# Patient Record
Sex: Female | Born: 1970 | Race: White | Hispanic: Yes | Smoking: Never smoker
Health system: Southern US, Community
[De-identification: ages and names within clinical notes are randomized; demographics above are authoritative.]

## PROBLEM LIST (undated history)

## (undated) DIAGNOSIS — R0600 Dyspnea, unspecified: Secondary | ICD-10-CM

## (undated) DIAGNOSIS — R200 Anesthesia of skin: Secondary | ICD-10-CM

## (undated) DIAGNOSIS — C541 Malignant neoplasm of endometrium: Secondary | ICD-10-CM

## (undated) DIAGNOSIS — R5383 Other fatigue: Secondary | ICD-10-CM

## (undated) DIAGNOSIS — Z87442 Personal history of urinary calculi: Secondary | ICD-10-CM

## (undated) DIAGNOSIS — I82C11 Acute embolism and thrombosis of right internal jugular vein: Secondary | ICD-10-CM

## (undated) DIAGNOSIS — G43909 Migraine, unspecified, not intractable, without status migrainosus: Secondary | ICD-10-CM

## (undated) DIAGNOSIS — Z8049 Family history of malignant neoplasm of other genital organs: Secondary | ICD-10-CM

## (undated) DIAGNOSIS — R000 Tachycardia, unspecified: Secondary | ICD-10-CM

## (undated) DIAGNOSIS — H9190 Unspecified hearing loss, unspecified ear: Secondary | ICD-10-CM

## (undated) DIAGNOSIS — E119 Type 2 diabetes mellitus without complications: Secondary | ICD-10-CM

## (undated) DIAGNOSIS — H53002 Unspecified amblyopia, left eye: Secondary | ICD-10-CM

## (undated) HISTORY — DX: Family history of malignant neoplasm of other genital organs: Z80.49

## (undated) HISTORY — DX: Type 2 diabetes mellitus without complications: E11.9

## (undated) HISTORY — DX: Acute embolism and thrombosis of right internal jugular vein: I82.C11

---

## 1997-04-05 ENCOUNTER — Ambulatory Visit (HOSPITAL_COMMUNITY): Admission: RE | Admit: 1997-04-05 | Discharge: 1997-04-05 | Payer: Self-pay | Admitting: Obstetrics and Gynecology

## 1997-04-16 ENCOUNTER — Other Ambulatory Visit: Admission: RE | Admit: 1997-04-16 | Discharge: 1997-04-16 | Payer: Self-pay | Admitting: Obstetrics and Gynecology

## 1997-04-30 ENCOUNTER — Ambulatory Visit (HOSPITAL_COMMUNITY): Admission: RE | Admit: 1997-04-30 | Discharge: 1997-04-30 | Payer: Self-pay | Admitting: Obstetrics and Gynecology

## 1997-05-30 ENCOUNTER — Other Ambulatory Visit: Admission: RE | Admit: 1997-05-30 | Discharge: 1997-05-30 | Payer: Self-pay | Admitting: Obstetrics and Gynecology

## 1997-06-05 ENCOUNTER — Inpatient Hospital Stay (HOSPITAL_COMMUNITY): Admission: AD | Admit: 1997-06-05 | Discharge: 1997-06-05 | Payer: Self-pay | Admitting: *Deleted

## 1997-06-27 ENCOUNTER — Inpatient Hospital Stay (HOSPITAL_COMMUNITY): Admission: AD | Admit: 1997-06-27 | Discharge: 1997-06-27 | Payer: Self-pay | Admitting: Obstetrics

## 1997-06-30 ENCOUNTER — Inpatient Hospital Stay (HOSPITAL_COMMUNITY): Admission: AD | Admit: 1997-06-30 | Discharge: 1997-06-30 | Payer: Self-pay | Admitting: *Deleted

## 1997-07-02 ENCOUNTER — Inpatient Hospital Stay (HOSPITAL_COMMUNITY): Admission: AD | Admit: 1997-07-02 | Discharge: 1997-07-02 | Payer: Self-pay | Admitting: Obstetrics

## 1997-07-04 ENCOUNTER — Inpatient Hospital Stay (HOSPITAL_COMMUNITY): Admission: RE | Admit: 1997-07-04 | Discharge: 1997-07-04 | Payer: Self-pay | Admitting: Obstetrics

## 1997-07-09 ENCOUNTER — Inpatient Hospital Stay (HOSPITAL_COMMUNITY): Admission: AD | Admit: 1997-07-09 | Discharge: 1997-07-11 | Payer: Self-pay | Admitting: Obstetrics

## 2003-09-06 ENCOUNTER — Emergency Department (HOSPITAL_COMMUNITY): Admission: EM | Admit: 2003-09-06 | Discharge: 2003-09-06 | Payer: Self-pay | Admitting: Emergency Medicine

## 2008-09-25 ENCOUNTER — Inpatient Hospital Stay (HOSPITAL_COMMUNITY): Admission: EM | Admit: 2008-09-25 | Discharge: 2008-09-28 | Payer: Self-pay | Admitting: Emergency Medicine

## 2008-10-02 ENCOUNTER — Inpatient Hospital Stay (HOSPITAL_COMMUNITY): Admission: EM | Admit: 2008-10-02 | Discharge: 2008-10-03 | Payer: Self-pay | Admitting: Emergency Medicine

## 2008-10-02 ENCOUNTER — Encounter (INDEPENDENT_AMBULATORY_CARE_PROVIDER_SITE_OTHER): Payer: Self-pay | Admitting: Surgery

## 2009-01-05 HISTORY — PX: CHOLECYSTECTOMY: SHX55

## 2009-06-10 ENCOUNTER — Emergency Department (HOSPITAL_COMMUNITY): Admission: EM | Admit: 2009-06-10 | Discharge: 2009-06-10 | Payer: Self-pay | Admitting: Emergency Medicine

## 2010-03-24 LAB — URINALYSIS, ROUTINE W REFLEX MICROSCOPIC
Bilirubin Urine: NEGATIVE
Glucose, UA: NEGATIVE mg/dL
Specific Gravity, Urine: 1.017 (ref 1.005–1.030)
Urobilinogen, UA: 1 mg/dL (ref 0.0–1.0)
pH: 7.5 (ref 5.0–8.0)

## 2010-03-24 LAB — COMPREHENSIVE METABOLIC PANEL
Albumin: 3.7 g/dL (ref 3.5–5.2)
Calcium: 8.7 mg/dL (ref 8.4–10.5)
Glucose, Bld: 96 mg/dL (ref 70–99)
Sodium: 136 mEq/L (ref 135–145)

## 2010-03-24 LAB — CBC
HCT: 39.4 % (ref 36.0–46.0)
Hemoglobin: 13.7 g/dL (ref 12.0–15.0)
MCV: 86.2 fL (ref 78.0–100.0)
Platelets: 346 10*3/uL (ref 150–400)
RBC: 4.57 MIL/uL (ref 3.87–5.11)
WBC: 9.8 10*3/uL (ref 4.0–10.5)

## 2010-03-24 LAB — GC/CHLAMYDIA PROBE AMP, GENITAL
Chlamydia, DNA Probe: NEGATIVE
GC Probe Amp, Genital: NEGATIVE

## 2010-03-24 LAB — DIFFERENTIAL
Basophils Absolute: 0 10*3/uL (ref 0.0–0.1)
Eosinophils Absolute: 0.3 10*3/uL (ref 0.0–0.7)
Eosinophils Relative: 3 % (ref 0–5)
Monocytes Relative: 5 % (ref 3–12)

## 2010-04-11 LAB — URINE CULTURE

## 2010-04-11 LAB — DIFFERENTIAL
Basophils Absolute: 0 10*3/uL (ref 0.0–0.1)
Basophils Absolute: 0.1 10*3/uL (ref 0.0–0.1)
Basophils Relative: 0 % (ref 0–1)
Eosinophils Absolute: 0.6 10*3/uL (ref 0.0–0.7)
Eosinophils Relative: 4 % (ref 0–5)
Eosinophils Relative: 5 % (ref 0–5)
Lymphocytes Relative: 29 % (ref 12–46)
Lymphs Abs: 3.3 10*3/uL (ref 0.7–4.0)
Lymphs Abs: 3.9 10*3/uL (ref 0.7–4.0)
Monocytes Absolute: 0.8 10*3/uL (ref 0.1–1.0)
Monocytes Relative: 5 % (ref 3–12)
Neutro Abs: 6.7 10*3/uL (ref 1.7–7.7)
Neutro Abs: 8.4 10*3/uL — ABNORMAL HIGH (ref 1.7–7.7)

## 2010-04-11 LAB — URINALYSIS, ROUTINE W REFLEX MICROSCOPIC
Bilirubin Urine: NEGATIVE
Bilirubin Urine: NEGATIVE
Glucose, UA: NEGATIVE mg/dL
Hgb urine dipstick: NEGATIVE
Ketones, ur: 15 mg/dL — AB
Ketones, ur: NEGATIVE mg/dL
Nitrite: NEGATIVE
Nitrite: NEGATIVE
Protein, ur: NEGATIVE mg/dL
Specific Gravity, Urine: 1.028 (ref 1.005–1.030)
Specific Gravity, Urine: 1.038 — ABNORMAL HIGH (ref 1.005–1.030)
Urobilinogen, UA: 0.2 mg/dL (ref 0.0–1.0)
Urobilinogen, UA: 0.2 mg/dL (ref 0.0–1.0)
pH: 5.5 (ref 5.0–8.0)
pH: 6 (ref 5.0–8.0)
pH: 6 (ref 5.0–8.0)

## 2010-04-11 LAB — POCT I-STAT, CHEM 8
Chloride: 102 mEq/L (ref 96–112)
HCT: 47 % — ABNORMAL HIGH (ref 36.0–46.0)
Hemoglobin: 16 g/dL — ABNORMAL HIGH (ref 12.0–15.0)
Potassium: 3.4 mEq/L — ABNORMAL LOW (ref 3.5–5.1)
Sodium: 138 mEq/L (ref 135–145)

## 2010-04-11 LAB — URINE MICROSCOPIC-ADD ON

## 2010-04-11 LAB — CBC
HCT: 41.1 % (ref 36.0–46.0)
Hemoglobin: 12.5 g/dL (ref 12.0–15.0)
Hemoglobin: 14.1 g/dL (ref 12.0–15.0)
MCHC: 34.2 g/dL (ref 30.0–36.0)
MCV: 86.6 fL (ref 78.0–100.0)
Platelets: 347 10*3/uL (ref 150–400)
Platelets: 409 10*3/uL — ABNORMAL HIGH (ref 150–400)

## 2010-04-11 LAB — BASIC METABOLIC PANEL
CO2: 28 mEq/L (ref 19–32)
Calcium: 8.9 mg/dL (ref 8.4–10.5)
Creatinine, Ser: 0.61 mg/dL (ref 0.4–1.2)
GFR calc Af Amer: >60 mL/min (ref >60)
GFR calc non Af Amer: >60 mL/min (ref >60)
Glucose, Bld: 92 mg/dL (ref 70–99)
Potassium: 4.3 mEq/L (ref 3.5–5.1)
Sodium: 138 mEq/L (ref 135–145)

## 2010-04-11 LAB — COMPREHENSIVE METABOLIC PANEL
ALT: 22 U/L (ref 0–35)
BUN: 13 mg/dL (ref 6–23)
Calcium: 9.3 mg/dL (ref 8.4–10.5)
Chloride: 102 mEq/L (ref 96–112)
GFR calc Af Amer: >60 mL/min (ref >60)
Potassium: 3.5 mEq/L (ref 3.5–5.1)

## 2010-04-11 LAB — HEPATIC FUNCTION PANEL
ALT: 29 U/L (ref 0–35)
AST: 19 U/L (ref 0–37)
Alkaline Phosphatase: 73 U/L (ref 39–117)
Bilirubin, Direct: 0.1 mg/dL (ref 0.0–0.3)

## 2010-04-11 LAB — LIPASE, BLOOD: Lipase: 28 U/L (ref 11–59)

## 2010-04-11 LAB — POCT CARDIAC MARKERS
CKMB, poc: <1 ng/mL — ABNORMAL LOW (ref 1.0–8.0)
Myoglobin, poc: 46.5 ng/mL (ref 12–200)

## 2010-04-11 LAB — POCT PREGNANCY, URINE: Preg Test, Ur: NEGATIVE

## 2010-09-29 ENCOUNTER — Emergency Department (HOSPITAL_COMMUNITY)
Admission: EM | Admit: 2010-09-29 | Discharge: 2010-09-29 | Disposition: A | Discharge status: Home / Self Care | Payer: Self-pay | Attending: Emergency Medicine | Admitting: Emergency Medicine

## 2010-09-29 DIAGNOSIS — Z87442 Personal history of urinary calculi: Secondary | ICD-10-CM | POA: Insufficient documentation

## 2010-09-29 DIAGNOSIS — K2920 Alcoholic gastritis without bleeding: Secondary | ICD-10-CM | POA: Insufficient documentation

## 2010-09-29 DIAGNOSIS — R109 Unspecified abdominal pain: Secondary | ICD-10-CM | POA: Insufficient documentation

## 2010-09-29 LAB — COMPREHENSIVE METABOLIC PANEL
ALT: 57 U/L — ABNORMAL HIGH (ref 0–35)
Alkaline Phosphatase: 76 U/L (ref 39–117)
CO2: 23 mEq/L (ref 19–32)
Chloride: 103 mEq/L (ref 96–112)
GFR calc Af Amer: >60 mL/min (ref >60)
Glucose, Bld: 112 mg/dL — ABNORMAL HIGH (ref 70–99)
Potassium: 3.2 mEq/L — ABNORMAL LOW (ref 3.5–5.1)
Sodium: 140 mEq/L (ref 135–145)
Total Bilirubin: 0.2 mg/dL — ABNORMAL LOW (ref 0.3–1.2)
Total Protein: 7.9 g/dL (ref 6.0–8.3)

## 2010-09-29 LAB — CBC
Hemoglobin: 13.3 g/dL (ref 12.0–15.0)
MCHC: 33.3 g/dL (ref 30.0–36.0)
RBC: 4.75 MIL/uL (ref 3.87–5.11)
WBC: 6.7 10*3/uL (ref 4.0–10.5)

## 2010-09-29 LAB — DIFFERENTIAL
Basophils Absolute: 0 10*3/uL (ref 0.0–0.1)
Basophils Relative: 0 % (ref 0–1)
Neutro Abs: 3.9 10*3/uL (ref 1.7–7.7)
Neutrophils Relative %: 58 % (ref 43–77)

## 2011-07-30 DIAGNOSIS — H11249 Scarring of conjunctiva, unspecified eye: Secondary | ICD-10-CM | POA: Insufficient documentation

## 2011-07-30 DIAGNOSIS — H02412 Mechanical ptosis of left eyelid: Secondary | ICD-10-CM | POA: Insufficient documentation

## 2011-07-30 DIAGNOSIS — H179 Unspecified corneal scar and opacity: Secondary | ICD-10-CM | POA: Insufficient documentation

## 2011-12-13 ENCOUNTER — Encounter (HOSPITAL_COMMUNITY): Payer: Self-pay | Admitting: *Deleted

## 2011-12-13 ENCOUNTER — Emergency Department (HOSPITAL_COMMUNITY): Payer: No Typology Code available for payment source

## 2011-12-13 ENCOUNTER — Emergency Department (HOSPITAL_COMMUNITY)
Admission: EM | Admit: 2011-12-13 | Discharge: 2011-12-13 | Disposition: A | Discharge status: Home / Self Care | Payer: No Typology Code available for payment source | Attending: Emergency Medicine | Admitting: Emergency Medicine

## 2011-12-13 ENCOUNTER — Other Ambulatory Visit: Payer: Self-pay

## 2011-12-13 DIAGNOSIS — M79609 Pain in unspecified limb: Secondary | ICD-10-CM | POA: Insufficient documentation

## 2011-12-13 DIAGNOSIS — T148XXA Other injury of unspecified body region, initial encounter: Secondary | ICD-10-CM

## 2011-12-13 DIAGNOSIS — T07XXXA Unspecified multiple injuries, initial encounter: Secondary | ICD-10-CM | POA: Insufficient documentation

## 2011-12-13 DIAGNOSIS — M2550 Pain in unspecified joint: Secondary | ICD-10-CM | POA: Insufficient documentation

## 2011-12-13 DIAGNOSIS — S129XXA Fracture of neck, unspecified, initial encounter: Secondary | ICD-10-CM | POA: Insufficient documentation

## 2011-12-13 DIAGNOSIS — Y9389 Activity, other specified: Secondary | ICD-10-CM | POA: Insufficient documentation

## 2011-12-13 DIAGNOSIS — IMO0001 Reserved for inherently not codable concepts without codable children: Secondary | ICD-10-CM | POA: Insufficient documentation

## 2011-12-13 DIAGNOSIS — R51 Headache: Secondary | ICD-10-CM | POA: Insufficient documentation

## 2011-12-13 DIAGNOSIS — Y92410 Unspecified street and highway as the place of occurrence of the external cause: Secondary | ICD-10-CM | POA: Insufficient documentation

## 2011-12-13 DIAGNOSIS — M542 Cervicalgia: Secondary | ICD-10-CM | POA: Insufficient documentation

## 2011-12-13 LAB — CBC WITH DIFFERENTIAL/PLATELET
Basophils Relative: 0 % (ref 0–1)
Eosinophils Absolute: 0.1 10*3/uL (ref 0.0–0.7)
HCT: 42.8 % (ref 36.0–46.0)
Hemoglobin: 14.7 g/dL (ref 12.0–15.0)
MCH: 29.5 pg (ref 26.0–34.0)
MCHC: 34.3 g/dL (ref 30.0–36.0)
Monocytes Absolute: 0.5 10*3/uL (ref 0.1–1.0)
Monocytes Relative: 4 % (ref 3–12)
Neutrophils Relative %: 84 % — ABNORMAL HIGH (ref 43–77)

## 2011-12-13 LAB — BASIC METABOLIC PANEL
BUN: 8 mg/dL (ref 6–23)
GFR calc non Af Amer: >90 mL/min (ref >90)
Glucose, Bld: 113 mg/dL — ABNORMAL HIGH (ref 70–99)
Potassium: 4.2 mEq/L (ref 3.5–5.1)

## 2011-12-13 MED ORDER — IBUPROFEN 600 MG PO TABS: 600.0000 mg | ORAL_TABLET | Freq: Four times a day (QID) | ORAL | Status: DC | PRN

## 2011-12-13 MED ORDER — HYDROMORPHONE HCL PF 1 MG/ML IJ SOLN
0.5000 mg | Freq: Once | INTRAMUSCULAR | Status: AC
  Administered 2011-12-13: 0.5 mg via INTRAVENOUS
  Filled 2011-12-13: qty 1

## 2011-12-13 MED ORDER — HYDROCODONE-ACETAMINOPHEN 5-325 MG PO TABS: 1.0000 | ORAL_TABLET | ORAL | Status: DC | PRN

## 2011-12-13 MED ORDER — METHOCARBAMOL 500 MG PO TABS: 500.0000 mg | ORAL_TABLET | Freq: Two times a day (BID) | ORAL | Status: DC

## 2011-12-13 MED ORDER — MORPHINE SULFATE 4 MG/ML IJ SOLN
4.0000 mg | Freq: Once | INTRAMUSCULAR | Status: AC
  Administered 2011-12-13: 4 mg via INTRAVENOUS
  Filled 2011-12-13: qty 1

## 2011-12-13 MED ORDER — HYDROCODONE-ACETAMINOPHEN 5-325 MG PO TABS: 2.0000 | ORAL_TABLET | Freq: Once | ORAL | Status: DC

## 2011-12-13 MED ORDER — SODIUM CHLORIDE 0.9 % IV BOLUS (SEPSIS)
1000.0000 mL | Freq: Once | INTRAVENOUS | Status: AC
  Administered 2011-12-13: 1000 mL via INTRAVENOUS

## 2011-12-13 MED ORDER — IOHEXOL 300 MG/ML  SOLN
100.0000 mL | Freq: Once | INTRAMUSCULAR | Status: AC | PRN
  Administered 2011-12-13: 100 mL via INTRAVENOUS

## 2011-12-13 MED ORDER — HYDROCODONE-ACETAMINOPHEN 5-325 MG PO TABS
1.0000 | ORAL_TABLET | Freq: Once | ORAL | Status: AC
  Administered 2011-12-13: 1 via ORAL
  Filled 2011-12-13: qty 1

## 2011-12-13 NOTE — ED Notes (Signed)
Pt has redness noted to L eye with what appears to be discoloration to the top of her retina.  Pt states that this is normal for her and she is being evaluated for this by her MD.
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
## 2011-12-13 NOTE — ED Notes (Signed)
Phlebotomy at bedside to collect blood samples

## 2015-12-09 ENCOUNTER — Emergency Department (HOSPITAL_COMMUNITY)
Admission: EM | Admit: 2015-12-09 | Discharge: 2015-12-09 | Disposition: A | Discharge status: Home / Self Care | Payer: Self-pay | Attending: Emergency Medicine | Admitting: Emergency Medicine

## 2015-12-09 ENCOUNTER — Emergency Department (HOSPITAL_COMMUNITY): Payer: Self-pay

## 2015-12-09 ENCOUNTER — Encounter (HOSPITAL_COMMUNITY): Payer: Self-pay | Admitting: Emergency Medicine

## 2015-12-09 DIAGNOSIS — S0990XA Unspecified injury of head, initial encounter: Secondary | ICD-10-CM | POA: Insufficient documentation

## 2015-12-09 DIAGNOSIS — Y92009 Unspecified place in unspecified non-institutional (private) residence as the place of occurrence of the external cause: Secondary | ICD-10-CM | POA: Insufficient documentation

## 2015-12-09 DIAGNOSIS — H05222 Edema of left orbit: Secondary | ICD-10-CM | POA: Insufficient documentation

## 2015-12-09 DIAGNOSIS — Z7982 Long term (current) use of aspirin: Secondary | ICD-10-CM | POA: Insufficient documentation

## 2015-12-09 DIAGNOSIS — S60221A Contusion of right hand, initial encounter: Secondary | ICD-10-CM | POA: Insufficient documentation

## 2015-12-09 DIAGNOSIS — Y999 Unspecified external cause status: Secondary | ICD-10-CM | POA: Insufficient documentation

## 2015-12-09 DIAGNOSIS — Y939 Activity, unspecified: Secondary | ICD-10-CM | POA: Insufficient documentation

## 2015-12-09 MED ORDER — HYDROCODONE-ACETAMINOPHEN 5-325 MG PO TABS
1.0000 | ORAL_TABLET | Freq: Four times a day (QID) | ORAL | 0 refills | Status: DC | PRN
Start: 2015-12-09 — End: 2022-01-14

## 2015-12-09 MED ORDER — HYDROCODONE-ACETAMINOPHEN 5-325 MG PO TABS
2.0000 | ORAL_TABLET | Freq: Once | ORAL | Status: AC
  Administered 2015-12-09: 2 via ORAL
  Filled 2015-12-09: qty 2
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
## 2015-12-09 NOTE — ED Notes (Signed)
Pt stable, ambulatory, states understanding of discharge instructions 
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
## 2022-01-12 ENCOUNTER — Other Ambulatory Visit: Payer: Self-pay

## 2022-01-12 ENCOUNTER — Emergency Department (HOSPITAL_COMMUNITY)
Admission: EM | Admit: 2022-01-12 | Discharge: 2022-01-13 | Disposition: A | Discharge status: Home / Self Care | Payer: Self-pay | Attending: Emergency Medicine | Admitting: Emergency Medicine

## 2022-01-12 DIAGNOSIS — R109 Unspecified abdominal pain: Secondary | ICD-10-CM

## 2022-01-12 DIAGNOSIS — N9489 Other specified conditions associated with female genital organs and menstrual cycle: Secondary | ICD-10-CM | POA: Insufficient documentation

## 2022-01-12 DIAGNOSIS — C539 Malignant neoplasm of cervix uteri, unspecified: Secondary | ICD-10-CM | POA: Insufficient documentation

## 2022-01-12 DIAGNOSIS — N888 Other specified noninflammatory disorders of cervix uteri: Secondary | ICD-10-CM

## 2022-01-12 DIAGNOSIS — N938 Other specified abnormal uterine and vaginal bleeding: Secondary | ICD-10-CM | POA: Insufficient documentation

## 2022-01-12 DIAGNOSIS — A599 Trichomoniasis, unspecified: Secondary | ICD-10-CM | POA: Insufficient documentation

## 2022-01-12 DIAGNOSIS — Z7982 Long term (current) use of aspirin: Secondary | ICD-10-CM | POA: Insufficient documentation

## 2022-01-12 LAB — CBC WITH DIFFERENTIAL/PLATELET
Abs Immature Granulocytes: 0.05 10*3/uL (ref 0.00–0.07)
Basophils Absolute: 0.1 10*3/uL (ref 0.0–0.1)
Basophils Relative: 1 %
Eosinophils Absolute: 0.1 10*3/uL (ref 0.0–0.5)
Eosinophils Relative: 1 %
HCT: 29.8 % — ABNORMAL LOW (ref 36.0–46.0)
Hemoglobin: 8.4 g/dL — ABNORMAL LOW (ref 12.0–15.0)
Immature Granulocytes: 0 %
Lymphocytes Relative: 26 %
Lymphs Abs: 3.4 10*3/uL (ref 0.7–4.0)
MCH: 18.5 pg — ABNORMAL LOW (ref 26.0–34.0)
MCHC: 28.2 g/dL — ABNORMAL LOW (ref 30.0–36.0)
MCV: 65.6 fL — ABNORMAL LOW (ref 80.0–100.0)
Monocytes Absolute: 0.7 10*3/uL (ref 0.1–1.0)
Monocytes Relative: 6 %
Neutro Abs: 9 10*3/uL — ABNORMAL HIGH (ref 1.7–7.7)
Neutrophils Relative %: 66 %
Platelets: 796 10*3/uL — ABNORMAL HIGH (ref 150–400)
RBC: 4.54 MIL/uL (ref 3.87–5.11)
RDW: 17.2 % — ABNORMAL HIGH (ref 11.5–15.5)
WBC: 13.4 10*3/uL — ABNORMAL HIGH (ref 4.0–10.5)
nRBC: 0.1 % (ref 0.0–0.2)

## 2022-01-12 LAB — COMPREHENSIVE METABOLIC PANEL
ALT: 41 U/L (ref 0–44)
AST: 42 U/L — ABNORMAL HIGH (ref 15–41)
Albumin: 3.3 g/dL — ABNORMAL LOW (ref 3.5–5.0)
Alkaline Phosphatase: 85 U/L (ref 38–126)
Anion gap: 9 (ref 5–15)
BUN: 5 mg/dL — ABNORMAL LOW (ref 6–20)
CO2: 25 mmol/L (ref 22–32)
Calcium: 8.8 mg/dL — ABNORMAL LOW (ref 8.9–10.3)
Chloride: 98 mmol/L (ref 98–111)
Creatinine, Ser: 0.56 mg/dL (ref 0.44–1.00)
GFR, Estimated: >60 mL/min (ref >60)
Glucose, Bld: 171 mg/dL — ABNORMAL HIGH (ref 70–99)
Potassium: 3.9 mmol/L (ref 3.5–5.1)
Sodium: 132 mmol/L — ABNORMAL LOW (ref 135–145)
Total Bilirubin: 0.5 mg/dL (ref 0.3–1.2)
Total Protein: 8 g/dL (ref 6.5–8.1)

## 2022-01-12 LAB — I-STAT BETA HCG BLOOD, ED (MC, WL, AP ONLY): I-stat hCG, quantitative: <5 m[IU]/mL (ref <5)

## 2022-01-12 LAB — LIPASE, BLOOD: Lipase: 39 U/L (ref 11–51)

## 2022-01-12 MED ORDER — ONDANSETRON 4 MG PO TBDP
4.0000 mg | ORAL_TABLET | Freq: Once | ORAL | Status: AC
  Administered 2022-01-12: 4 mg via ORAL
  Filled 2022-01-12: qty 1

## 2022-01-12 MED ORDER — OXYCODONE-ACETAMINOPHEN 5-325 MG PO TABS
2.0000 | ORAL_TABLET | Freq: Once | ORAL | Status: AC
  Administered 2022-01-12: 2 via ORAL
  Filled 2022-01-12: qty 2

## 2022-01-12 NOTE — ED Triage Notes (Signed)
Patient reports intermittent generalized abdominal pain with emesis for several days , no fever or diarrhea .

## 2022-01-12 NOTE — ED Provider Triage Note (Signed)
  Emergency Medicine Provider Triage Evaluation Note  MRN:  [id number]  Arrival date & time: 01/13/22    Medically screening exam initiated at 2:40 AM.   CC:   Abdominal Pain   HPI:  Rowan Pollman is a 52 y.o. year-old female presents to the ED with chief complaint of lower abdominal pain for the past 8-9 months.  She states she has the pain everyday, but it worsened today.  Has had nausea and vomiting.  Reports associated subjective fever at home.   Denies dysuria.  Last BM 4 hours ago, which was normal.  History provided by patient. ROS:  -As included in HPI PE:   Vitals:   01/13/22 0156  BP: (!) 147/85  Pulse: 84  Resp: 16  SpO2: 97%    Non-toxic appearing No respiratory distress Generalized lower abdominal discomfort MDM:  Abdominal pain, unspecified. I've ordered labs and imaging in triage to expedite lab/diagnostic workup.  Patient was informed that the remainder of the evaluation will be completed by another provider, this initial triage assessment does not replace that evaluation, and the importance of remaining in the ED until their evaluation is complete.    Montine Circle, PA-C 01/13/22 [phone]

## 2022-01-13 ENCOUNTER — Encounter: Payer: Self-pay | Admitting: Psychiatry

## 2022-01-13 ENCOUNTER — Emergency Department (HOSPITAL_COMMUNITY): Payer: Self-pay

## 2022-01-13 LAB — WET PREP, GENITAL
Clue Cells Wet Prep HPF POC: NONE SEEN
Sperm: NONE SEEN
WBC, Wet Prep HPF POC: >=10 — AB (ref <10)
Yeast Wet Prep HPF POC: NONE SEEN

## 2022-01-13 MED ORDER — IOHEXOL 350 MG/ML SOLN
75.0000 mL | Freq: Once | INTRAVENOUS | Status: AC | PRN
  Administered 2022-01-13: 75 mL via INTRAVENOUS

## 2022-01-13 MED ORDER — DOXYCYCLINE HYCLATE 100 MG PO TABS
100.0000 mg | ORAL_TABLET | Freq: Once | ORAL | Status: AC
  Administered 2022-01-13: 100 mg via ORAL
  Filled 2022-01-13: qty 1

## 2022-01-13 MED ORDER — STERILE WATER FOR INJECTION IJ SOLN
INTRAMUSCULAR | Status: AC
  Filled 2022-01-13: qty 10

## 2022-01-13 MED ORDER — METRONIDAZOLE 500 MG PO TABS
2000.0000 mg | ORAL_TABLET | Freq: Once | ORAL | Status: AC
  Administered 2022-01-13: 2000 mg via ORAL
  Filled 2022-01-13: qty 4

## 2022-01-13 MED ORDER — CEFTRIAXONE SODIUM 500 MG IJ SOLR
500.0000 mg | Freq: Once | INTRAMUSCULAR | Status: AC
  Administered 2022-01-13: 500 mg via INTRAMUSCULAR
  Filled 2022-01-13: qty 500

## 2022-01-13 MED ORDER — DOXYCYCLINE HYCLATE 100 MG PO CAPS: 100.0000 mg | ORAL_CAPSULE | Freq: Two times a day (BID) | ORAL | 0 refills | Status: DC

## 2022-01-13 NOTE — ED Provider Notes (Signed)
[facility] EMERGENCY DEPARTMENT Provider Note   CSN: [id number] Arrival date & time: 01/12/22  2104     History  Chief Complaint  Patient presents with   Abdominal Pain    Maureen Campos is a 52 y.o. female.  Patient here with abdominal pain for last 8 to 9 months.  She has been having some intermittent vaginal bleeding during this time which she thinks is menopause.  She is not on any birth control.  Does not have an OB/GYN or other primary care doctors.  Has not seek medical care in over 10 years.  She has been having some hard stools.  Denies any nausea vomiting, diarrhea, chest pain, shortness of breath.  The history is provided by the patient.       Home Medications Prior to Admission medications   Medication Sig Start Date End Date Taking? Authorizing Provider  aspirin 325 MG tablet Take 325 mg by mouth every 4 (four) hours as needed for mild pain.    [provider]  HYDROcodone-acetaminophen (NORCO/VICODIN) 5-325 MG tablet Take 1-2 tablets by mouth every 6 (six) hours as needed. 12/09/15   Montine Circle, PA-C  ibuprofen (ADVIL,MOTRIN) 600 MG tablet Take 1 tablet (600 mg total) by mouth every 6 (six) hours as needed for pain. Patient not taking: Reported on 12/09/2015 12/13/11   Varney Biles, MD  methocarbamol (ROBAXIN) 500 MG tablet Take 1 tablet (500 mg total) by mouth 2 (two) times daily. Patient not taking: Reported on 12/09/2015 12/13/11   Varney Biles, MD      Allergies    Patient has no known allergies.    Review of Systems   Review of Systems  Physical Exam Updated Vital Signs BP (!) 149/78   Pulse 86   Temp 99 F (37.2 C) (Oral)   Resp 19   SpO2 97%  Physical Exam Vitals and nursing note reviewed. Exam conducted with a chaperone present.  Constitutional:      General: She is not in acute distress.    Appearance: She is well-developed.  HENT:     Head: Normocephalic and atraumatic.  Eyes:     Extraocular  Movements: Extraocular movements intact.     Conjunctiva/sclera: Conjunctivae normal.  Cardiovascular:     Rate and Rhythm: Normal rate and regular rhythm.     Heart sounds: Normal heart sounds. No murmur heard. Pulmonary:     Effort: Pulmonary effort is normal. No respiratory distress.     Breath sounds: Normal breath sounds.  Abdominal:     General: Bowel sounds are normal.     Palpations: Abdomen is soft.     Tenderness: There is generalized abdominal tenderness.  Genitourinary:    Comments: No obvious cervical masses seen but not able to visualize the lower portion of the cervix, there is no vaginal bleeding on my exam Musculoskeletal:        General: No swelling.     Cervical back: Neck supple.  Skin:    General: Skin is warm and dry.     Capillary Refill: Capillary refill takes less than 2 seconds.  Neurological:     Mental Status: She is alert.  Psychiatric:        Mood and Affect: Mood normal.     ED Results / Procedures / Treatments   Labs (all labs ordered are listed, but only abnormal results are displayed) Labs Reviewed  COMPREHENSIVE METABOLIC PANEL - Abnormal; Notable for the following components:  Result Value   Sodium 132 (*)    Glucose, Bld 171 (*)    BUN 5 (*)    Calcium 8.8 (*)    Albumin 3.3 (*)    AST 42 (*)    All other components within normal limits  CBC WITH DIFFERENTIAL/PLATELET - Abnormal; Notable for the following components:   WBC 13.4 (*)    Hemoglobin 8.4 (*)    HCT 29.8 (*)    MCV 65.6 (*)    MCH 18.5 (*)    MCHC 28.2 (*)    RDW 17.2 (*)    Platelets 796 (*)    Neutro Abs 9.0 (*)    All other components within normal limits  WET PREP, GENITAL  LIPASE, BLOOD  URINALYSIS, ROUTINE W REFLEX MICROSCOPIC  I-STAT BETA HCG BLOOD, ED ([facility], [facility], [facility] ONLY)  GC/CHLAMYDIA PROBE AMP ([facility]) NOT AT [facility]    EKG None  Radiology CT ABDOMEN PELVIS W CONTRAST  Result Date: 01/13/2022 CLINICAL DATA:  Acute nonlocalized abdominal pain.  History of colon surgery EXAM: CT ABDOMEN AND PELVIS WITH CONTRAST TECHNIQUE: Multidetector CT imaging of the abdomen and pelvis was performed using the standard protocol following bolus administration of intravenous contrast. RADIATION DOSE REDUCTION: This exam was performed according to the departmental dose-optimization program which includes automated exposure control, adjustment of the mA and/or kV according to patient size and/or use of iterative reconstruction technique. CONTRAST:  20m OMNIPAQUE IOHEXOL 350 MG/ML SOLN COMPARISON:  12/13/2011 FINDINGS: Lower chest: Bibasilar scarring no acute abnormality. Hepatobiliary: Cholecystectomy.  No focal hepatic lesion. Pancreas: Unremarkable. Spleen: Low-density lesion in the posterosuperior spleen is too small to definitively characterize but likely represents a benign cyst. No follow-up is recommended. Adrenals/Urinary Tract: Normal adrenal glands. Low-attenuation lesions in the kidneys are statistically likely to represent cysts. No follow-up is required. No urinary calculi or hydronephrosis. Unremarkable bladder. Stomach/Bowel: Colonic diverticulosis without diverticulitis. Normal caliber large bowel. There is borderline dilation the fecalized distal ileum in the left hemiabdomen measuring 3.1 cm in maximum diameter with smooth transition to the ileocolonic junction. Normal appendix. Vascular/Lymphatic: Right para-aortic node measuring 1.5 cm (series 3/image 45). Right external iliac node measuring 1.5 cm (3/70). These have increased in size since 2013. Reproductive: Lobulated heterogenous cystic lesion centered within the cervix (circa sagittal series image 83). Endometrial thickening measuring approximately 14 mm. No adnexal mass. Other: No free intraperitoneal fluid or air. Musculoskeletal: No acute or significant osseous findings. IMPRESSION: Heterogenous cystic mass centered in the cervix concerning for neoplasm. Endometrial thickening possibly due to  obstruction from the cystic mass. Direct visualization is recommended. Enlarged right para-aortic and right external iliac nodes are indeterminate but concerning for metastasis given cervical lesion. Borderline enlargement and fecalization of the distal ileum. No definite transition point. Findings suggestive of slow transit/constipation. Electronically Signed   By: TPlacido SouM.D.   On: 01/13/2022 02:03    Procedures Procedures    Medications Ordered in ED Medications  oxyCODONE-acetaminophen (PERCOCET/ROXICET) 5-325 MG per tablet 2 tablet (2 tablets Oral Given 01/12/22 2223)  ondansetron (ZOFRAN-ODT) disintegrating tablet 4 mg (4 mg Oral Given 01/12/22 2223)  iohexol (OMNIPAQUE) 350 MG/ML injection 75 mL (75 mLs Intravenous Contrast Given 01/13/22 0139)    ED Course/ Medical Decision Making/ A&P                           Medical Decision Making Amount and/or Complexity of Data Reviewed Labs: ordered.   Maureen Pugmireis  here with abdominal pain.  Normal vitals.  No fever.  No significant comorbidities.  Differential diagnosis includes bowel obstruction versus constipation versus appendicitis.  CBC, CMP, lipase, CT scan abdomen pelvis has been performed prior to my evaluation.  Per my review and interpretation of labs there is no significant anemia or electrolyte abnormality.  Hemoglobin is 8.4 but there is no recent hemoglobin in our system.  Lab work is otherwise unremarkable.  CT scan shows may be some constipation.  But of most concern is a cystic mass centered in the cervix concerning for neoplasm.  Some endometrial thickening as well.  There is also some enlarged lymph nodes nearby that are concerning for cervical lesion with metastasis.  I talked with Dr. Ernestina Patches with gynecology oncology and patient will follow-up outpatient.  Her GU exam for the most part was unremarkable for me.  She not having any active bleeding.  She has been passing some intermittent blood clots every few days  for the last few months.  Overall her hemoglobin stable, her vitals are stable.  She understands return precautions to the ED but will follow-up as am concerned this is likely a cervical cancer process.  Patient was made aware of this.  She is given Dr. Romona Curls number and Dr. Ernestina Patches is aware as well.  Patient discharged in good condition.  This chart was dictated using voice recognition software.  Despite best efforts to proofread,  errors can occur which can change the documentation meaning.         Final Clinical Impression(s) / ED Diagnoses Final diagnoses:  Cervical mass  Abdominal pain, unspecified abdominal location    Rx / DC Orders ED Discharge Orders     None         Lennice Sites, DO 01/13/22 [phone]

## 2022-01-13 NOTE — ED Notes (Signed)
Pt denies soaking more than one pad an hour

## 2022-01-13 NOTE — Discharge Instructions (Addendum)
As discussed there is likely a cervical mass that needs to be evaluated by gynecology team.  Please follow-up with Dr. Ernestina Patches as discussed.  Our concern is that this could be a cancerous process.  Also recommend using MiraLAX for any constipation symptoms as well.  Please return if you have any worsening vaginal bleeding in that leads to chest pain, shortness of breath, lightheadedness.  I have also treated you for trichomonas, gonorrhea and chlamydia.  Do not have any sexual activity until you complete your antibiotics.  Have any sex partners go to the health department for evaluation and possible treatment.

## 2022-01-14 ENCOUNTER — Encounter: Payer: Self-pay | Admitting: Psychiatry

## 2022-01-14 ENCOUNTER — Other Ambulatory Visit: Payer: Self-pay

## 2022-01-14 ENCOUNTER — Inpatient Hospital Stay: Payer: Self-pay | Attending: Psychiatry | Admitting: Psychiatry

## 2022-01-14 VITALS — BP 112/62 | HR 106 | Temp 98.6°F | Resp 18 | Ht 60.5 in | Wt 236.0 lb

## 2022-01-14 DIAGNOSIS — R109 Unspecified abdominal pain: Secondary | ICD-10-CM | POA: Insufficient documentation

## 2022-01-14 DIAGNOSIS — D390 Neoplasm of uncertain behavior of uterus: Secondary | ICD-10-CM | POA: Insufficient documentation

## 2022-01-14 DIAGNOSIS — R102 Pelvic and perineal pain: Secondary | ICD-10-CM | POA: Insufficient documentation

## 2022-01-14 DIAGNOSIS — C541 Malignant neoplasm of endometrium: Secondary | ICD-10-CM | POA: Insufficient documentation

## 2022-01-14 DIAGNOSIS — N939 Abnormal uterine and vaginal bleeding, unspecified: Secondary | ICD-10-CM

## 2022-01-14 DIAGNOSIS — Z01818 Encounter for other preprocedural examination: Secondary | ICD-10-CM | POA: Insufficient documentation

## 2022-01-14 DIAGNOSIS — N898 Other specified noninflammatory disorders of vagina: Secondary | ICD-10-CM | POA: Insufficient documentation

## 2022-01-14 DIAGNOSIS — N888 Other specified noninflammatory disorders of cervix uteri: Secondary | ICD-10-CM

## 2022-01-14 LAB — GC/CHLAMYDIA PROBE AMP (~~LOC~~) NOT AT ARMC
Chlamydia: NEGATIVE
Comment: NEGATIVE
Comment: NORMAL
Neisseria Gonorrhea: NEGATIVE

## 2022-01-14 NOTE — Patient Instructions (Signed)
It was a pleasure to see you in clinic today. - We will follow-up the results of the biopies - Return visit planned for next week  Thank you very much for allowing me to provide care for you today.  I appreciate your confidence in choosing our Gynecologic Oncology team at [facility].  If you have any questions about your visit today please call our office or send [place] a MyChart message and we will get back to you as soon as possible.

## 2022-01-14 NOTE — Progress Notes (Signed)
GYNECOLOGIC ONCOLOGY NEW PATIENT CONSULTATION  Date of Service: 01/14/2022 Referring Provider: Lennice Sites, DO   ASSESSMENT AND PLAN: Maureen Campos is a 52 y.o. woman with imaging concerning for a cervical mass, however normal ectocervix on exam.  Reviewed with patient imaging findings and exam findings.  Normal ectocervix, however fullness of the endocervical canal versus lower uterine segment concerning for an adenocarcinoma of the endocervix or possible endometrial cancer.  Reviewed in brief that these types of cancers are treated differently, thus we will wait for biopsy results to help further elucidate origin of disease.  Endocervical curettings and endometrial biopsy obtained today and sent for pathology.  See procedure note below.  Reviewed that imaging also demonstrated a few enlarged lymph nodes which can be concerning for cancer as well.  In brief, discussed that if this is felt to represent a cervical cancer, the primary treatment would be chemoradiation.  However if this was felt to represent an endometrial cancer, primary treatment is surgical, however, preoperative neoadjuvant treatment may be required in some settings.  Patient may require additional imaging following biopsy results.  Will have patient return for additional treatment counseling after pathology results.   A copy of this note was sent to the patient's referring provider.  Bernadene Bell, MD Gynecologic Oncology   Medical Decision Making I personally spent  TOTAL 50 minutes face-to-face and non-face-to-face in the care of this patient, which includes all pre, intra, and post visit time on the date of service.   ------------  CC: Cervical mass  HISTORY OF PRESENT ILLNESS:  Maureen Campos is a 52 y.o. woman who is seen in consultation at the request of Lennice Sites, DO for evaluation of cervical mass.  Patient presented to the emergency department on 01/12/2022 due to several months of abdominal  pain.  Patient also reported intermittent vaginal bleeding during this time period.  Patient endorsed not having a primary care provider and has not sought medical care for over 10 years.  During her evaluation she underwent CT abdomen pelvis which noted a heterogeneous cystic mass in the cervix, endometrial thickening possibly due to obstruction from cystic mass, and enlarged right para-aortic and right external iliac lymph nodes.  A pelvic exam was performed without discrete mass noted.  A wet prep was positive for trichomonas.  Gonorrhea and Chlamydia are pending.  Patient was given a prescription for doxycycline and was given a dose of metronidazole and ceftriaxone in the emergency department.  Today patient presents with her daughter.  She reports that she has been having abnormal bleeding for about a year.  She reports that she wears a diaper to deal with the heavier days.  Some days are spotting some days are heavier with clots.  The bleeding occurs most days.  She otherwise reports abdominal pain that is cramping in nature at times and stabbing at other times.  Walking and aspirin/Tylenol sometimes helps the pain.  She otherwise endorses bloating and early satiety but denies significant weight loss, or a change in bowel or bladder habits.   PAST MEDICAL HISTORY: History reviewed. No pertinent past medical history.  PAST SURGICAL HISTORY: Past Surgical History:  Procedure Laterality Date   CHOLECYSTECTOMY  2011    OB/GYN HISTORY: OB History  Gravida Para Term Preterm AB Living  '6 4 4   2 4  '$ SAB IAB Ectopic Multiple Live Births  2       4    # Outcome Date GA Lbr Len/2nd Weight Sex Delivery Anes PTL  Lv  6 Term      Vag-Spont   LIV  5 Term      Vag-Spont   LIV  4 Term      Vag-Spont   LIV  3 Term      Vag-Spont   LIV  2 SAB      SAB     1 SAB      SAB         Age at menarche: 73 Age at menopause: N/A Hx of HRT: No Hx of STI: Yes, trichomonas Last pap: None History of abnormal  pap smears: Unknown  SCREENING STUDIES:  Last mammogram: None Last colonoscopy: None  MEDICATIONS:  Current Outpatient Medications:    acetaminophen (TYLENOL) 500 MG tablet, Take 500 mg by mouth every 6 (six) hours as needed., Disp: , Rfl:   ALLERGIES: No Known Allergies  FAMILY HISTORY: Family History  Problem Relation Age of Onset   Colon cancer Neg Hx    Breast cancer Neg Hx    Ovarian cancer Neg Hx    Endometrial cancer Neg Hx    Pancreatic cancer Neg Hx    Prostate cancer Neg Hx     SOCIAL HISTORY: Social History   Socioeconomic History   Marital status: Significant Other    Spouse name: Not on file   Number of children: Not on file   Years of education: Not on file   Highest education level: Not on file  Occupational History   Not on file  Tobacco Use   Smoking status: Never   Smokeless tobacco: Never  Vaping Use   Vaping Use: Never used  Substance and Sexual Activity   Alcohol use: Yes    Comment: every weekend   Drug use: No   Sexual activity: Yes    Birth control/protection: None  Other Topics Concern   Not on file  Social History Narrative   Not on file   Social Determinants of Health   Financial Resource Strain: Not on file  Food Insecurity: Not on file  Transportation Needs: Not on file  Physical Activity: Not on file  Stress: Not on file  Social Connections: Not on file  Intimate Partner Violence: Not on file    REVIEW OF SYSTEMS: New patient intake form was reviewed.  Complete 10-system review is negative except for the following: Appetite change, hearing loss, shortness of breath, urinary frequency, menstrual problems, joint pain, bleeding, fever/chills, yellowing of eyes, wheezing, back pain, numbness, anxiety, fatigue, vision problems, abdominal pain, vaginal bleeding, muscle pain/cramps, dizziness, depression, cough, hot flashes, vaginal discharge  PHYSICAL EXAM: BP 112/62 (BP Location: Right Arm, Patient Position: Sitting)   Pulse  (!) 106   Temp 98.6 F (37 C) (Oral)   Resp 18   Ht 5' 0.5" (1.537 m)   Wt 236 lb (107 kg)   BMI 45.33 kg/m  Constitutional: No acute distress. Neuro/Psych: Alert, oriented.  Head and Neck: Normocephalic, atraumatic. Neck symmetric without masses. Sclera anicteric.  Respiratory: Normal work of breathing. Clear to auscultation bilaterally. Cardiovascular: Regular rate and rhythm, no murmurs, rubs, or gallops. Abdomen: Normoactive bowel sounds. Soft, non-distended, non-tender to palpation.No masses or hepatosplenomegaly appreciated. No evidence of hernia. No palpable fluid wave.  Well-healed laparoscopic incisions. Extremities: Grossly normal range of motion. Warm, well perfused. No edema bilaterally. Skin:No rashes or lesions. Lymphatic: No cervical, supraclavicular, or inguinal adenopathy. Genitourinary: External genitalia without lesions. Urethral meatus without lesions or prolapse. On speculum exam, vagina and cervix without lesions.  No  blood in vault.  Bimanual exam reveals normal ectocervix but fullness superiorly within endocervical canal and lower uterine segment.  No adnexal masses.Rectovaginal exam confirms the above findings and reveals normal sphincter tone and no discrete parametrial nodularity although exam limited by patient discomfort with exam. Exam chaperoned by Joylene John, NP  EMB and ECC Procedure: After appropriate verbal informed consent was obtained, a timeout was performed. A sterile speculum was placed in the vagina, and the area was cleaned with betadine x3. A single-tooth tenaculum was placed on the anterior lip of the cervix.  And endocervical curette was inserted into the cervical canal, and an endocervical curetting was obtained.  An endometrial biopsy pipelle was advanced carefully to the uterine fundus which sounded to 7.5cm. An adequate sample was obtained over 2 passes. The tenaculum was removed, and tenaculum sites were noted to be hemostatic. The speculum was  removed from the vagina. The patient tolerated the procedure well.   UPT: n/a, perimenopausal    LABORATORY AND RADIOLOGIC DATA: Outside medical records were reviewed to synthesize the above history, along with the history and physical obtained during the visit.  Outside laboratory, pathology, and imaging reports were reviewed, with pertinent results below.  I personally reviewed the outside images.  WBC  Date Value Ref Range Status  01/12/2022 13.4 (H) 4.0 - 10.5 K/uL Final   Hemoglobin  Date Value Ref Range Status  01/12/2022 8.4 (L) 12.0 - 15.0 g/dL Final    Comment:    Reticulocyte Hemoglobin testing may be clinically indicated, consider ordering this additional test [id number]    HCT  Date Value Ref Range Status  01/12/2022 29.8 (L) 36.0 - 46.0 % Final   Platelets  Date Value Ref Range Status  01/12/2022 796 (H) 150 - 400 K/uL Final   Creatinine, Ser  Date Value Ref Range Status  01/12/2022 0.56 0.44 - 1.00 mg/dL Final   AST  Date Value Ref Range Status  01/12/2022 42 (H) 15 - 41 U/L Final   ALT  Date Value Ref Range Status  01/12/2022 41 0 - 44 U/L Final    CT ABDOMEN PELVIS W CONTRAST 01/13/2022  Narrative CLINICAL DATA:  Acute nonlocalized abdominal pain. History of colon surgery  EXAM: CT ABDOMEN AND PELVIS WITH CONTRAST  TECHNIQUE: Multidetector CT imaging of the abdomen and pelvis was performed using the standard protocol following bolus administration of intravenous contrast.  RADIATION DOSE REDUCTION: This exam was performed according to the departmental dose-optimization program which includes automated exposure control, adjustment of the mA and/or kV according to patient size and/or use of iterative reconstruction technique.  CONTRAST:  27m OMNIPAQUE IOHEXOL 350 MG/ML SOLN  COMPARISON:  12/13/2011  FINDINGS: Lower chest: Bibasilar scarring no acute abnormality.  Hepatobiliary: Cholecystectomy.  No focal hepatic lesion.  Pancreas:  Unremarkable.  Spleen: Low-density lesion in the posterosuperior spleen is too small to definitively characterize but likely represents a benign cyst. No follow-up is recommended.  Adrenals/Urinary Tract: Normal adrenal glands. Low-attenuation lesions in the kidneys are statistically likely to represent cysts. No follow-up is required. No urinary calculi or hydronephrosis. Unremarkable bladder.  Stomach/Bowel: Colonic diverticulosis without diverticulitis. Normal caliber large bowel. There is borderline dilation the fecalized distal ileum in the left hemiabdomen measuring 3.1 cm in maximum diameter with smooth transition to the ileocolonic junction. Normal appendix.  Vascular/Lymphatic: Right para-aortic node measuring 1.5 cm (series 3/image 45). Right external iliac node measuring 1.5 cm (3/70). These have increased in size since 2013.  Reproductive: Lobulated heterogenous cystic  lesion centered within the cervix (circa sagittal series image 83). Endometrial thickening measuring approximately 14 mm. No adnexal mass.  Other: No free intraperitoneal fluid or air.  Musculoskeletal: No acute or significant osseous findings.  IMPRESSION: Heterogenous cystic mass centered in the cervix concerning for neoplasm. Endometrial thickening possibly due to obstruction from the cystic mass. Direct visualization is recommended.  Enlarged right para-aortic and right external iliac nodes are indeterminate but concerning for metastasis given cervical lesion.  Borderline enlargement and fecalization of the distal ileum. No definite transition point. Findings suggestive of slow transit/constipation.   Electronically Signed By: Placido Sou M.D. On: 01/13/2022 02:03

## 2022-01-16 ENCOUNTER — Encounter: Payer: Self-pay | Admitting: Psychiatry

## 2022-01-18 ENCOUNTER — Encounter: Payer: Self-pay | Admitting: Psychiatry

## 2022-01-19 ENCOUNTER — Other Ambulatory Visit: Payer: Self-pay

## 2022-01-19 ENCOUNTER — Inpatient Hospital Stay (HOSPITAL_BASED_OUTPATIENT_CLINIC_OR_DEPARTMENT_OTHER): Payer: Self-pay | Admitting: Psychiatry

## 2022-01-19 VITALS — BP 128/78 | HR 90 | Temp 99.1°F | Resp 18 | Wt 236.0 lb

## 2022-01-19 DIAGNOSIS — N888 Other specified noninflammatory disorders of cervix uteri: Secondary | ICD-10-CM

## 2022-01-19 DIAGNOSIS — N858 Other specified noninflammatory disorders of uterus: Secondary | ICD-10-CM

## 2022-01-19 DIAGNOSIS — C541 Malignant neoplasm of endometrium: Secondary | ICD-10-CM

## 2022-01-19 DIAGNOSIS — C775 Secondary and unspecified malignant neoplasm of intrapelvic lymph nodes: Secondary | ICD-10-CM

## 2022-01-19 NOTE — Patient Instructions (Signed)
It was a pleasure to see you in clinic today. - We will plan to get a CT chest and an MRI pelvis  Thank you very much for allowing me to provide care for you today.  I appreciate your confidence in choosing our Gynecologic Oncology team at [facility].  If you have any questions about your visit today please call our office or send [place] a MyChart message and we will get back to you as soon as possible.

## 2022-01-19 NOTE — Progress Notes (Signed)
Gynecologic Oncology Return Clinic Visit  Date of Service: 01/19/2022 Referring Provider: Lennice Sites, DO  Assessment & Plan: Maureen Campos is a 52 y.o. woman with clinical stage III endometrial cancer who presents for follow-up and treatment planning.  At time of visit, preliminary read on pathology results indicated that biopsies were more consistent with endometrioid endometrial cancer over endocervical adenocarcinoma. Given enlarged lower uterine segment but normal ectocervix and no apparent parametrial involvement on exam, this is clinically consistent. 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   Reviewed with patient that given CT A/P showed adenopathy in the pelvis and lower para-aortic locations, recommend CT chest to rule out distant disease that would preclude primary surgical debulking. Will also recommend MRI pelvis in case no distant disease, to better evaluate lower uterine versus cervical involvement for surgical planning. Discussed that if distant disease or extensive cervical involvement that may require radical hysterectomy for adequate resection, would likely recommend neoadjuvant treatment.   CT chest scheduled for 1/19 and MRI pelvis scheduled for 1/20. Pending these results, will determine final treatment plan.  RTC 1 week.  Bernadene Bell, MD Gynecologic Oncology   Medical Decision Making I personally spent  TOTAL 30 minutes face-to-face and non-face-to-face in the care of this patient, which includes all pre, intra, and post visit time on the date of service.  ----------------------- Reason for Visit: Follow-up, treatment planning  Treatment History: Oncology History  Endometrial cancer ([place])  01/13/2022 Imaging   CT Abdomen/Pelvis: IMPRESSION: Heterogenous cystic mass centered in the cervix concerning for neoplasm. Endometrial  thickening possibly due to obstruction from the cystic mass. Direct visualization is recommended.   Enlarged right para-aortic and right external iliac nodes are indeterminate but concerning for metastasis given cervical lesion.   Borderline enlargement and fecalization of the distal ileum. No definite transition point. Findings suggestive of slow transit/constipation.   01/14/2022 Initial Biopsy   Endometrial biopsy and ECC: A. ENDOCERVIX, CURETTAGE: - BENIGN ENDOCERVICAL MUCOSA WITH ACUTE AND CHRONIC INFLAMMATION - NEGATIVE FOR MALIGNANCY  B. 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  The tumor cells are partially positive for p16. The p53 stain shows foci with wild-type staining and foci with overexpression.  The tumor cells show patchy positivity for ER and PR and are negative for Napsin A.  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  Drs. Doreene Eland reviewed the case and agree with the above diagnosis.  Clinical correlation recommended.    01/14/2022 Initial Diagnosis   Endometrial cancer ([place])     Interval History: Since her last visit, pt reports mucous like clear discharge but no bleeding this week. She is almost done her doxycyline prescription. She reports feeling warm and chills but has not taken her temperature at home.   Past Medical/Surgical History: History reviewed. No pertinent past medical history.  Past Surgical History:  Procedure Laterality Date   CHOLECYSTECTOMY  2011    Family History  Problem Relation Age of Onset   Colon cancer Neg Hx    Breast cancer Neg Hx    Ovarian cancer Neg Hx    Endometrial cancer Neg Hx    Pancreatic cancer Neg Hx    Prostate  cancer  Neg Hx     Social History   Socioeconomic History   Marital status: Significant Other    Spouse name: Not on file   Number of children: Not on file   Years of education: Not on file   Highest education level: Not on file  Occupational History   Not on file  Tobacco Use   Smoking status: Never   Smokeless tobacco: Never  Vaping Use   Vaping Use: Never used  Substance and Sexual Activity   Alcohol use: Yes    Comment: every weekend   Drug use: No   Sexual activity: Yes    Birth control/protection: None  Other Topics Concern   Not on file  Social History Narrative   Not on file   Social Determinants of Health   Financial Resource Strain: Not on file  Food Insecurity: Not on file  Transportation Needs: Not on file  Physical Activity: Not on file  Stress: Not on file  Social Connections: Not on file    Current Medications:  Current Outpatient Medications:    acetaminophen (TYLENOL) 500 MG tablet, Take 500 mg by mouth every 6 (six) hours as needed., Disp: , Rfl:   Review of Symptoms: Complete 10-system review is positive for the following: appetite change, SOB, urinary frequency, joint pain, wheezing, bloating, blood in urine, back pain, anxiety, fatigue, chest pain, abdominal pain, vaginal bleeding, dizziness, depression, weigh change, cough, swelling, hot flashes, vaginal discharge  Physical Exam: BP 128/78 (BP Location: Left Arm)   Pulse 90   Temp 99.1 F (37.3 C) (Oral)   Resp 18   Wt 236 lb (107 kg)   BMI 45.33 kg/m  General: Alert, oriented, no acute distress. HEENT: Normocephalic, atraumatic. Neck symmetric without masses. Sclera anicteric.  Chest: Normal work of breathing.    Laboratory & Radiologic Studies: Endometrial biopsy (01/14/22): FINAL MICROSCOPIC DIAGNOSIS:  A. ENDOCERVIX, CURETTAGE: - BENIGN ENDOCERVICAL MUCOSA WITH ACUTE AND CHRONIC INFLAMMATION - NEGATIVE FOR MALIGNANCY  B. ENDOMETRIUM, BIOPSY: - ENDOMETRIAL ENDOMETRIOID CARCINOMA  WITH CLEAR CELL CHANGE, FIGO GRADE 2 - SEE COMMENT    COMMENT: Immunohistochemical staining for 16, p53, Napsin A, ER and PR is performed on block B1.  The tumor cells are partially positive for p16. The p53 stain shows foci with wild-type staining and foci with overexpression.  The tumor cells show patchy positivity for ER and PR and are negative for Napsin A.  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  Drs. Doreene Eland reviewed the case and agree with the above diagnosis.  Clinical correlation recommended.

## 2022-01-22 ENCOUNTER — Encounter: Payer: Self-pay | Admitting: Psychiatry

## 2022-01-23 ENCOUNTER — Ambulatory Visit (HOSPITAL_COMMUNITY)
Admission: RE | Admit: 2022-01-23 | Discharge: 2022-01-23 | Disposition: A | Discharge status: Home / Self Care | Payer: Self-pay | Source: Ambulatory Visit | Attending: Psychiatry | Admitting: Psychiatry

## 2022-01-23 ENCOUNTER — Telehealth: Payer: Self-pay | Admitting: Licensed Clinical Social Worker

## 2022-01-23 ENCOUNTER — Encounter (HOSPITAL_COMMUNITY): Payer: Self-pay

## 2022-01-23 DIAGNOSIS — C775 Secondary and unspecified malignant neoplasm of intrapelvic lymph nodes: Secondary | ICD-10-CM | POA: Insufficient documentation

## 2022-01-23 DIAGNOSIS — C541 Malignant neoplasm of endometrium: Secondary | ICD-10-CM | POA: Insufficient documentation

## 2022-01-23 MED ORDER — IOHEXOL 300 MG/ML  SOLN
75.0000 mL | Freq: Once | INTRAMUSCULAR | Status: AC | PRN
  Administered 2022-01-23: 75 mL via INTRAVENOUS

## 2022-01-23 MED ORDER — SODIUM CHLORIDE (PF) 0.9 % IJ SOLN
INTRAMUSCULAR | Status: AC
  Filled 2022-01-23: qty 50

## 2022-01-23 NOTE — Telephone Encounter (Signed)
[place] Work  Clinical Social Work was referred by new patient protocol for assessment of psychosocial needs.  Clinical Social Worker attempted to contact patient by phone  to offer support and assess for needs.   Number in chart went to pt's daughter's VM. CSW left brief message with call back number.     [place], [place] Worker [employer]

## 2022-01-24 ENCOUNTER — Ambulatory Visit (HOSPITAL_COMMUNITY)
Admission: RE | Admit: 2022-01-24 | Discharge: 2022-01-24 | Disposition: A | Discharge status: Home / Self Care | Payer: Self-pay | Source: Ambulatory Visit | Attending: Psychiatry | Admitting: Psychiatry

## 2022-01-24 ENCOUNTER — Encounter: Payer: Self-pay | Admitting: Psychiatry

## 2022-01-24 DIAGNOSIS — C541 Malignant neoplasm of endometrium: Secondary | ICD-10-CM | POA: Insufficient documentation

## 2022-01-24 DIAGNOSIS — N888 Other specified noninflammatory disorders of cervix uteri: Secondary | ICD-10-CM | POA: Insufficient documentation

## 2022-01-24 DIAGNOSIS — C775 Secondary and unspecified malignant neoplasm of intrapelvic lymph nodes: Secondary | ICD-10-CM | POA: Insufficient documentation

## 2022-01-24 MED ORDER — GADOBUTROL 1 MMOL/ML IV SOLN
10.0000 mL | Freq: Once | INTRAVENOUS | Status: AC | PRN
  Administered 2022-01-24: 10 mL via INTRAVENOUS

## 2022-01-26 ENCOUNTER — Other Ambulatory Visit: Payer: Self-pay

## 2022-01-26 ENCOUNTER — Inpatient Hospital Stay (HOSPITAL_BASED_OUTPATIENT_CLINIC_OR_DEPARTMENT_OTHER): Payer: Self-pay | Admitting: Gynecologic Oncology

## 2022-01-26 ENCOUNTER — Inpatient Hospital Stay (HOSPITAL_BASED_OUTPATIENT_CLINIC_OR_DEPARTMENT_OTHER): Payer: Self-pay | Admitting: Psychiatry

## 2022-01-26 ENCOUNTER — Other Ambulatory Visit: Payer: Self-pay | Admitting: Psychiatry

## 2022-01-26 ENCOUNTER — Other Ambulatory Visit: Payer: Self-pay | Admitting: Oncology

## 2022-01-26 VITALS — BP 155/72 | HR 95 | Temp 98.3°F | Ht 61.61 in | Wt 236.6 lb

## 2022-01-26 DIAGNOSIS — R590 Localized enlarged lymph nodes: Secondary | ICD-10-CM

## 2022-01-26 DIAGNOSIS — C775 Secondary and unspecified malignant neoplasm of intrapelvic lymph nodes: Secondary | ICD-10-CM

## 2022-01-26 DIAGNOSIS — N888 Other specified noninflammatory disorders of cervix uteri: Secondary | ICD-10-CM

## 2022-01-26 DIAGNOSIS — C541 Malignant neoplasm of endometrium: Secondary | ICD-10-CM

## 2022-01-26 DIAGNOSIS — R102 Pelvic and perineal pain: Secondary | ICD-10-CM

## 2022-01-26 MED ORDER — TRAMADOL HCL 50 MG PO TABS: 50.0000 mg | ORAL_TABLET | Freq: Four times a day (QID) | ORAL | 0 refills | Status: DC | PRN

## 2022-01-26 NOTE — Progress Notes (Signed)
Gynecologic Oncology Multi-Disciplinary Disposition Conference Note  Date of the Conference: 01/26/2022  Patient Name: Maureen Campos  Referring Provider: Dr. Ronnald Nian  Primary GYN Oncologist: Dr. 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 Disposition is for a PET scan.  If paratracheal lymph nodes are negative then recommendation is for surgery.  If positive then adjuvant treatment. Also genetics referral.  This Multidisciplinary conference took place involving physicians from Gynecologic Oncology, Medical Oncology, Radiation Oncology, Pathology, Radiology along with the Gynecologic Oncology Nurse Practitioner and Gynecologic Oncology Nurse Navigator.  Comprehensive assessment of the patient's malignancy, staging, need for surgery, chemotherapy, radiation therapy, and need for further testing were reviewed. Supportive measures, both inpatient and following discharge were also discussed. The recommended plan of care is documented. Greater than 35 minutes were spent correlating and coordinating this patient's care.

## 2022-01-26 NOTE — Progress Notes (Unsigned)
Gynecologic Oncology Return Clinic Visit  Date of Service: 01/26/2022 Referring Provider: Virgina Norfolk, DO  Assessment & Plan: Maureen Campos is a 52 y.o. woman with clinical stage III endometrial cancer who presents for follow-up and treatment planning.  At time of visit, preliminary read on pathology results indicated that biopsies were more consistent with endometrioid endometrial cancer over endocervical adenocarcinoma. Given enlarged lower uterine segment but normal ectocervix and no apparent parametrial involvement on exam, this is clinically consistent. 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   Reviewed with patient that given CT A/P showed adenopathy in the pelvis and lower para-aortic locations, recommend CT chest to rule out distant disease that would preclude primary surgical debulking. Will also recommend MRI pelvis in case no distant disease, to better evaluate lower uterine versus cervical involvement for surgical planning. Discussed that if distant disease or extensive cervical involvement that may require radical hysterectomy for adequate resection, would likely recommend neoadjuvant treatment.   CT chest scheduled for 1/19 and MRI pelvis scheduled for 1/20. Pending these results, will determine final treatment plan.  Can walk 10-15 minute prior to a break  RTC 1 week.  Clide Cliff, MD Gynecologic Oncology   Medical Decision Making I personally spent  TOTAL 30 minutes face-to-face and non-face-to-face in the care of this patient, which includes all pre, intra, and post visit time on the date of service.  ----------------------- Reason for Visit: Follow-up, treatment planning  Treatment History: Oncology History  Endometrial cancer (HCC)  01/13/2022 Imaging   CT Abdomen/Pelvis: IMPRESSION: Heterogenous cystic mass centered in the cervix  concerning for neoplasm. Endometrial thickening possibly due to obstruction from the cystic mass. Direct visualization is recommended.   Enlarged right para-aortic and right external iliac nodes are indeterminate but concerning for metastasis given cervical lesion.   Borderline enlargement and fecalization of the distal ileum. No definite transition point. Findings suggestive of slow transit/constipation.   01/14/2022 Initial Biopsy   Endometrial biopsy and ECC: A. ENDOCERVIX, CURETTAGE: - BENIGN ENDOCERVICAL MUCOSA WITH ACUTE AND CHRONIC INFLAMMATION - NEGATIVE FOR MALIGNANCY  B. 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  The tumor cells are partially positive for p16. The p53 stain shows foci with wild-type staining and foci with overexpression.  The tumor cells show patchy positivity for ER and PR and are negative for Napsin A.  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  Drs. Arthur Holms reviewed the case and agree with the above diagnosis.  Clinical correlation recommended.    01/14/2022 Initial Diagnosis   Endometrial cancer (HCC)     Interval History: Since her last visit, pt reports mucous like clear discharge but no bleeding this week. She is almost done her doxycyline prescription. She reports feeling warm and chills but has not taken her temperature at home.  Cramping pelvic BM every day - firm  Past Medical/Surgical History: History reviewed. No pertinent past medical history.  Past Surgical History:  Procedure Laterality Date   CHOLECYSTECTOMY  2011    Family History  Problem Relation Age of Onset   Colon cancer Neg Hx    Breast cancer Neg Hx    Ovarian cancer Neg Hx  Endometrial cancer Neg Hx    Pancreatic cancer Neg Hx    Prostate cancer Neg Hx     Social History   Socioeconomic History   Marital status: Significant Other    Spouse name: Not on file   Number of children: Not on file   Years of education: Not on file   Highest education level: Not on file  Occupational History   Not on file  Tobacco Use   Smoking status: Never   Smokeless tobacco: Never  Vaping Use   Vaping Use: Never used  Substance and Sexual Activity   Alcohol use: Yes    Comment: every weekend   Drug use: No   Sexual activity: Yes    Birth control/protection: None  Other Topics Concern   Not on file  Social History Narrative   Not on file   Social Determinants of Health   Financial Resource Strain: Not on file  Food Insecurity: Not on file  Transportation Needs: Not on file  Physical Activity: Not on file  Stress: Not on file  Social Connections: Not on file    Current Medications:  Current Outpatient Medications:    acetaminophen (TYLENOL) 500 MG tablet, Take 500 mg by mouth every 6 (six) hours as needed., Disp: , Rfl:   Review of Symptoms: Complete 10-system review is positive for the following: appetite change, SOB, urinary frequency, joint pain, wheezing, bloating, blood in urine, back pain, anxiety, fatigue, chest pain, abdominal pain, vaginal bleeding, dizziness, depression, weigh change, cough, swelling, hot flashes, vaginal discharge  Physical Exam: There were no vitals taken for this visit. General: Alert, oriented, no acute distress. HEENT: Normocephalic, atraumatic. Neck symmetric without masses. Sclera anicteric.  Chest: Normal work of breathing.    Laboratory & Radiologic Studies: Endometrial biopsy (01/14/22): FINAL MICROSCOPIC DIAGNOSIS:  A. ENDOCERVIX, CURETTAGE: - BENIGN ENDOCERVICAL MUCOSA WITH ACUTE AND CHRONIC INFLAMMATION - NEGATIVE FOR MALIGNANCY  B. ENDOMETRIUM, BIOPSY: - ENDOMETRIAL ENDOMETRIOID CARCINOMA WITH CLEAR CELL  CHANGE, FIGO GRADE 2 - SEE COMMENT    COMMENT: Immunohistochemical staining for 16, p53, Napsin A, ER and PR is performed on block B1.  The tumor cells are partially positive for p16. The p53 stain shows foci with wild-type staining and foci with overexpression.  The tumor cells show patchy positivity for ER and PR and are negative for Napsin A.  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  Drs. Arthur Holms reviewed the case and agree with the above diagnosis.  Clinical correlation recommended.

## 2022-01-26 NOTE — H&P (View-Only) (Signed)
Gynecologic Oncology Return Clinic Visit  Date of Service: 01/26/2022 Referring Provider: Lennice Sites, DO  Assessment & Plan: Maureen Campos is a 52 y.o. woman with at least stage IIIC2 endometrial cancer who presents for follow-up and treatment planning.  Imaging results reviewed. Also reviewed tumor board discussion from this morning.  Based on her prior CT A/P and MRI pelvis, I feel that her pelvic disease would be resectable via MIS approach. However, she has at least one indeterminate enlarged mediastinal lymph node. Based on imaging review and tumor board discussion, this does not clearly represent distant metastatic disease based on CT chest alone. To better characterize, a PET CT was recommended. This has been ordered and scheduled for 02/06/22.  Discussed that if PET is negative in chest, upper abdomen, will proceed with robotic-assisted total laparoscopic hysterectomy, bilateral salpingo-oophorectomy and lymph node removal. If the PET shows unresectable upper abdominal or chest disease, we will proceed with neoadjuvant chemotherapy.  Biopsy was noted to demonstrate loss of nuclear expressino of MLH2 and PMS2 (MMRd). MLH1 promoter hypermethylation is pending.   Given pt's increased cramping pain, recommended trial of ibuprofen. Also recommended starting a stool softener. Rx for Tramadol given for breakthrough pain.  Patient was consented for: robotic assisted total laparoscopic hysterectomy, bilateral salpingo-oophorectomy, lymph node dissection, possible laparotomy on 02/10/22.  The risks of surgery were discussed in detail and she understands these to including but not limited to bleeding requiring a blood transfusion, infection, injury to adjacent organs (including but not limited to the bowels, bladder, ureters, nerves, blood vessels), thromboembolic events, wound separation, hernia, vaginal cuff separation, possible risk of lymphedema and lymphocyst if lymphadenectomy performed,  unforseen complication, and possible need for re-exploration.  If the patient experiences any of these events, she understands that her hospitalization or recovery may be prolonged and that she may need to take additional medications for a prolonged period. The patient will receive DVT and antibiotic prophylaxis as indicated. She voiced a clear understanding. She had the opportunity to ask questions and verbal informed consent was obtained today. She wishes to proceed.  She does not require preoperative clearance. Her METs are >4.  All preoperative instructions were reviewed. Postoperative expectations were also reviewed.   RTC 2 weeks postop.  Bernadene Bell, MD Gynecologic Oncology   Medical Decision Making I personally spent  TOTAL 35 minutes face-to-face and non-face-to-face in the care of this patient, which includes all pre, intra, and post visit time on the date of service.  ----------------------- Reason for Visit: Follow-up, treatment planning  Treatment History: Oncology History  Endometrial cancer ([place])  01/13/2022 Imaging   CT Abdomen/Pelvis: IMPRESSION: Heterogenous cystic mass centered in the cervix concerning for neoplasm. Endometrial thickening possibly due to obstruction from the cystic mass. Direct visualization is recommended.   Enlarged right para-aortic and right external iliac nodes are indeterminate but concerning for metastasis given cervical lesion.   Borderline enlargement and fecalization of the distal ileum. No definite transition point. Findings suggestive of slow transit/constipation.   01/14/2022 Initial Biopsy   Endometrial biopsy and ECC: A. ENDOCERVIX, CURETTAGE: - BENIGN ENDOCERVICAL MUCOSA WITH ACUTE AND CHRONIC INFLAMMATION - NEGATIVE FOR MALIGNANCY  B. ENDOMETRIUM, BIOPSY: - ENDOMETRIAL ENDOMETRIOID CARCINOMA WITH CLEAR CELL CHANGE, FIGO GRADE 2 - SEE COMMENT  COMMENT: Immunohistochemical staining for 16, p53, Napsin A, ER and PR  is performed on block B1.  The tumor cells are partially positive for p16. The p53 stain shows foci with wild-type staining and foci with overexpression.  The tumor cells  show patchy positivity for ER and PR and are negative for Napsin A.  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  Drs. Doreene Eland reviewed the case and agree with the above diagnosis.  Clinical correlation recommended.    01/14/2022 Initial Diagnosis   Endometrial cancer ([place])     Interval History: Pt presents again today with her daughter. Pt reports increased pelvic cramping. She is having a bowel movement every day but it is firm. Otherwise her other symptoms are overall stable.   Past Medical/Surgical History: History reviewed. No pertinent past medical history.  Past Surgical History:  Procedure Laterality Date   CHOLECYSTECTOMY  2011    Family History  Problem Relation Age of Onset   Colon cancer Neg Hx    Breast cancer Neg Hx    Ovarian cancer Neg Hx    Endometrial cancer Neg Hx    Pancreatic cancer Neg Hx    Prostate cancer Neg Hx     Social History   Socioeconomic History   Marital status: Significant Other    Spouse name: Not on file   Number of children: Not on file   Years of education: Not on file   Highest education level: Not on file  Occupational History   Not on file  Tobacco Use   Smoking status: Never   Smokeless tobacco: Never  Vaping Use   Vaping Use: Never used  Substance and Sexual Activity   Alcohol use: Yes    Comment: every weekend   Drug use: No   Sexual activity: Yes    Birth control/protection: None  Other Topics Concern   Not on file  Social History Narrative   Not on file   Social Determinants of Health   Financial Resource Strain: Not on file  Food Insecurity: Not on file  Transportation Needs: Not on file  Physical Activity: Not on  file  Stress: Not on file  Social Connections: Not on file    Current Medications:  Current Outpatient Medications:    acetaminophen (TYLENOL) 500 MG tablet, Take 500 mg by mouth every 6 (six) hours as needed., Disp: , Rfl:    traMADol (ULTRAM) 50 MG tablet, Take 1 tablet (50 mg total) by mouth every 6 (six) hours as needed., Disp: 20 tablet, Rfl: 0  Review of Symptoms: Complete 10-system review is positive for the following: appetite change, hearing loss, SOB, urinary frequency, wheezing, bloating, blood in urine, back pain, anxiety, fatigue, chest pain, abdominal pain, vaginal bleeding, dizziness, depression, weight change, cough, swelling, hot flashes, vaginal discharge  Physical Exam: BP (!) 155/72 (BP Location: Right Arm, Patient Position: Sitting) Comment: patient stated she is in a lot of pain.  Pulse 95   Temp 98.3 F (36.8 C) (Oral)   Ht 5' 1.61" (1.565 m)   Wt 236 lb 9.6 oz (107.3 kg)   SpO2 97%   BMI 43.82 kg/m  General: Alert, oriented, no acute distress. HEENT: Normocephalic, atraumatic. Neck symmetric without masses. Sclera anicteric.  Chest: Normal work of breathing.    Laboratory & Radiologic Studies: Endometrial biopsy (01/14/22): FINAL MICROSCOPIC DIAGNOSIS:  A. ENDOCERVIX, CURETTAGE: - BENIGN ENDOCERVICAL MUCOSA WITH ACUTE AND CHRONIC INFLAMMATION - NEGATIVE FOR MALIGNANCY  B. ENDOMETRIUM, BIOPSY: - ENDOMETRIAL ENDOMETRIOID CARCINOMA WITH CLEAR CELL CHANGE, FIGO GRADE 2 - SEE COMMENT    COMMENT: Immunohistochemical staining for 16, p53, Napsin A, ER and PR is performed on block B1.  The tumor cells are partially positive for p16. The p53 stain shows foci with wild-type staining and foci with overexpression.  The tumor cells show patchy positivity for ER and PR and are negative for Napsin A.  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   Drs. Doreene Eland reviewed the case and agree with the above diagnosis.  Clinical correlation recommended.   MR Pelvis W Wo Contrast 01/24/2022  Narrative CLINICAL DATA:  Endometrial carcinoma.  EXAM: MRI PELVIS WITHOUT AND WITH CONTRAST  TECHNIQUE: Multiplanar multisequence MR imaging of the pelvis was performed both before and after administration of intravenous contrast.  CONTRAST:  24m GADAVIST GADOBUTROL 1 MMOL/ML IV SOLN  COMPARISON:  CT chest 01/23/2022 and CT AP 01/13/2022  FINDINGS: Urinary Tract: Urinary bladder appears within normal limits. Right kidney cyst is noted measuring 1.8 cm. No follow-up imaging recommended.  Bowel:  Unremarkable visualized pelvic bowel loops.  Vascular/Lymphatic: No vascular abnormality identified.  Aortocaval lymph node is enlarged measuring 1.5 cm, image 3/6.  Right common iliac lymph node measures 1.5 cm, image 18/4.  Right pelvic sidewall lymph node measures 1 cm, image 21/4.  Left external iliac node measures 1 cm, image 20/4.  Reproductive:  Uterus: Measures 7.7 by 10.1 by 6.4 cm.  Endometrium: Measures 2 cm in thickness.  Within the lower uterine segment there is an enhancing mass which measures 4.3 x 3.9 by 4.7 cm. Tumor invades the anterior myometrium within the lower uterine segment, image 25/2. This appears to terminate at the level of the internal os of the cervix.  There are 2 well-circumscribed nabothian cysts noted with along the posterior wall the cervix which measures up to 1.7 cm.  Right ovary: Measures 3.3 x 2.0 3.3 cm.  No adnexal mass.  Left ovary measures 4.2 x 2.3 by 2.3 cm. Dominant follicle measures 2 cm.  Other:  There is no ascites.  No peritoneal implants identified.  Musculoskeletal: No suspicious bone lesions identified.  IMPRESSION: 1. There is an enhancing mass identified within the lower uterine segment which measures 4.3 x 3.9 by 4.7 cm. This invades the anterior myometrium  within the lower uterine segment. This appears to terminate at the level of the internal os of the cervix. Findings are compatible with endometrial carcinoma. 2. Aortocaval, right common iliac, and bilateral pelvic sidewall lymph nodes are enlarged compatible with metastatic adenopathy. Given the presence of periaortic and pelvic node involvement imaging findings are compatible with stage IIIC2 disease.   Electronically Signed By: TKerby MoorsM.D. On: 01/24/2022 13:14   CT Chest W Contrast 01/23/2022  Narrative CLINICAL DATA:  Staging of endometrial cancer. New diagnosis in January 2024. Mid chest pain and fevers. Dizziness for 1 week.  EXAM: CT CHEST WITH CONTRAST  TECHNIQUE: Multidetector CT imaging of the chest was performed during intravenous contrast administration.  RADIATION DOSE REDUCTION: This exam was performed according to the departmental dose-optimization program which includes automated exposure control, adjustment of the mA and/or kV according to patient size and/or use of iterative reconstruction technique.  CONTRAST:  773mOMNIPAQUE IOHEXOL 300 MG/ML  SOLN  COMPARISON:  Chest radiograph 12/09/2015  FINDINGS: Cardiovascular: Normal heart size. No pericardial effusions. Normal caliber thoracic aorta. No aortic dissection. Great vessel origins are patent.  Mediastinum/Nodes: Esophagus is decompressed. Thyroid gland is unremarkable. Moderately prominent lymph nodes in the mediastinum with largest right paratracheal nodes measuring about 1.3 cm short axis dimension.  Lungs/Pleura:  Coarse linear interstitial and alveolar infiltrates in the right upper lung, extending to the right middle lung and superior segment of the right lower lung. Focal area of infiltration with honeycombing in the left lingula. Changes are nonspecific and could represent chronic fibrosis and bronchitis or pneumonias. No pleural effusions. No parenchymal mass lesions identified.  Calcified granuloma in the right apex.  Upper Abdomen: Surgical absence of the gallbladder. No acute process identified.  Musculoskeletal: Degenerative changes in the spine. Old appearing right anterior rib fractures. No destructive bone lesions.  IMPRESSION: 1. Coarse infiltrates mostly in the right lung with focal infiltrates in the left lingula. Changes likely represent chronic fibrosis and chronic bronchitic changes. Acute pneumonia would be a less likely consideration. 2. Prominent mediastinal lymph nodes measuring up to 1.3 cm diameter, nonspecific. 3. No pulmonary mass lesion to suggest lung metastasis.   Electronically Signed By: Lucienne Capers M.D. On: 01/23/2022 15:24

## 2022-01-26 NOTE — Patient Instructions (Signed)
It was a pleasure to see you in clinic today. - Ibuprofen '600mg'$  every 6 hours as needed for pain or '800mg'$  every 8 hour.  - Colace (stool softener) one tablet twice a day.  Thank you very much for allowing me to provide care for you today.  I appreciate your confidence in choosing our Gynecologic Oncology team at [facility].  If you have any questions about your visit today please call our office or send [place] a MyChart message and we will get back to you as soon as possible.   Plan to have a PET scan and Dr. Ernestina Patches will contact you with the results. For the PET scan, nothing to eat or drink 6 hours before.  Preparing for your Surgery  Plan for surgery on February 10, 2022 with Dr. Bernadene Bell at [place] will be scheduled for robotic assisted total laparoscopic hysterectomy (removal of the uterus and cervix), bilateral salpingo-oophorectomy (removal of both ovaries and fallopian tubes), lymph node dissection, possible laparotomy (larger incision on your abdomen if needed).   Pre-operative Testing -You will receive a phone call from presurgical testing at [facility] to arrange for a pre-operative appointment and lab work.  -Bring your insurance card, copy of an advanced directive if applicable, medication list  -At that visit, you will be asked to sign a consent for a possible blood transfusion in case a transfusion becomes necessary during surgery.  The need for a blood transfusion is rare but having consent is a necessary part of your care.     -You should not be taking blood thinners or aspirin at least ten days prior to surgery unless instructed by your surgeon.  -Do not take supplements such as fish oil (omega 3), red yeast rice, turmeric before your surgery. You want to avoid medications with aspirin in them including headache powders such as BC or Goody's), Excedrin migraine.  Day Before Surgery at [place] will be asked to take in a light diet the day  before surgery. You will be advised you can have clear liquids up until 3 hours before your surgery.    Eat a light diet the day before surgery.  Examples including soups, broths, toast, yogurt, mashed potatoes.  AVOID GAS PRODUCING FOODS. Things to avoid include carbonated beverages (fizzy beverages, sodas), raw fruits and raw vegetables (uncooked), or beans.   If your bowels are filled with gas, your surgeon will have difficulty visualizing your pelvic organs which increases your surgical risks.  Your role in recovery Your role is to become active as soon as directed by your doctor, while still giving yourself time to heal.  Rest when you feel tired. You will be asked to do the following in order to speed your recovery:  - Cough and breathe deeply. This helps to clear and expand your lungs and can prevent pneumonia after surgery.  - [place]. Do mild physical activity. Walking or moving your legs help your circulation and body functions return to normal. Do not try to get up or walk alone the first time after surgery.   -If you develop swelling on one leg or the other, pain in the back of your leg, redness/warmth in one of your legs, please call the office or go to the Emergency Room to have a doppler to rule out a blood clot. For shortness of breath, chest pain-seek care in the Emergency Room as soon as possible. - Actively manage your pain. Managing  your pain lets you move in comfort. We will ask you to rate your pain on a scale of zero to 10. It is your responsibility to tell your doctor or nurse where and how much you hurt so your pain can be treated.  Special Considerations -If you are diabetic, you may be placed on insulin after surgery to have closer control over your blood sugars to promote healing and recovery.  This does not mean that you will be discharged on insulin.  If applicable, your oral antidiabetics will be resumed when you are tolerating a solid  diet.  -Your final pathology results from surgery should be available around one week after surgery and the results will be relayed to you when available.  -Dr. Lahoma Crocker is the surgeon that assists your GYN Oncologist with surgery.  If you end up staying the night, the next day after your surgery you will either see Dr. Berline Lopes, Dr. Ernestina Patches, or Dr. Lahoma Crocker.  -FMLA forms can be faxed to [phone] and please allow 5-7 business days for completion.  Pain Management After Surgery -You will be prescribed your pain medication and bowel regimen medications before surgery so that you can have these available when you are discharged from the hospital. The pain medication is for use ONLY AFTER surgery and a new prescription will not be given.   -Make sure that you have Tylenol and Ibuprofen IF YOU ARE ABLE TO TAKE THESE MEDICATIONS at home to use on a regular basis after surgery for pain control. We recommend alternating the medications every hour to six hours since they work differently and are processed in the body differently for pain relief.  -Review the attached handout on narcotic use and their risks and side effects.   Bowel Regimen -You will be prescribed Sennakot-S to take nightly to prevent constipation especially if you are taking the narcotic pain medication intermittently.  It is important to prevent constipation and drink adequate amounts of liquids. You can stop taking this medication when you are not taking pain medication and you are back on your normal bowel routine.  Risks of Surgery Risks of surgery are low but include bleeding, infection, damage to surrounding structures, re-operation, blood clots, and very rarely death.   Blood Transfusion Information (For the consent to be signed before surgery)  We will be checking your blood type before surgery so in case of emergencies, we will know what type of blood you would need.                                             WHAT IS A BLOOD TRANSFUSION?  A transfusion is the replacement of blood or some of its parts. Blood is made up of multiple cells which provide different functions. Red blood cells carry oxygen and are used for blood loss replacement. White blood cells fight against infection. Platelets control bleeding. Plasma helps clot blood. Other blood products are available for specialized needs, such as hemophilia or other clotting disorders. BEFORE THE TRANSFUSION  Who gives blood for transfusions?  You may be able to donate blood to be used at a later date on yourself (autologous donation). Relatives can be asked to donate blood. This is generally not any safer than if you have received blood from a stranger. The same precautions are taken to ensure safety when a relative's blood is donated. Healthy  volunteers who are fully evaluated to make sure their blood is safe. This is blood bank blood. Transfusion therapy is the safest it has ever been in the practice of medicine. Before blood is taken from a donor, a complete history is taken to make sure that person has no history of diseases nor engages in risky social behavior (examples are intravenous drug use or sexual activity with multiple partners). The donor's travel history is screened to minimize risk of transmitting infections, such as malaria. The donated blood is tested for signs of infectious diseases, such as HIV and hepatitis. The blood is then tested to be sure it is compatible with you in order to minimize the chance of a transfusion reaction. If you or a relative donates blood, this is often done in anticipation of surgery and is not appropriate for emergency situations. It takes many days to process the donated blood. RISKS AND COMPLICATIONS Although transfusion therapy is very safe and saves many lives, the main dangers of transfusion include:  Getting an infectious disease. Developing a transfusion reaction. This is an allergic reaction  to something in the blood you were given. Every precaution is taken to prevent this. The decision to have a blood transfusion has been considered carefully by your caregiver before blood is given. Blood is not given unless the benefits outweigh the risks.  AFTER SURGERY INSTRUCTIONS  Return to work: 4-6 weeks if applicable  Activity: 1. Be up and out of the bed during the day.  Take a nap if needed.  You may walk up steps but be careful and use the hand rail.  Stair climbing will tire you more than you think, you may need to stop part way and rest.   2. No lifting or straining for 6 weeks over 10 pounds. No pushing, pulling, straining for 6 weeks.  3. No driving for around 1 week(s).  Do not drive if you are taking narcotic pain medicine and make sure that your reaction time has returned.   4. You can shower as soon as the next day after surgery. Shower daily.  Use your regular soap and water (not directly on the incision) and pat your incision(s) dry afterwards; don't rub.  No tub baths or submerging your body in water until cleared by your surgeon. If you have the soap that was given to you by pre-surgical testing that was used before surgery, you do not need to use it afterwards because this can irritate your incisions.   5. No sexual activity and nothing in the vagina for 12 weeks.  6. You may experience a small amount of clear drainage from your incisions, which is normal.  If the drainage persists, increases, or changes color please call the office.  7. Do not use creams, lotions, or ointments such as neosporin on your incisions after surgery until advised by your surgeon because they can cause removal of the dermabond glue on your incisions.    8. You may experience vaginal spotting after surgery or around the 6-8 week mark from surgery when the stitches at the top of the vagina begin to dissolve.  The spotting is normal but if you experience heavy bleeding, call our office.  9. Take  Tylenol or ibuprofen first for pain if you are able to take these medications and only use narcotic pain medication for severe pain not relieved by the Tylenol or Ibuprofen.  Monitor your Tylenol intake to a max of 4,000 mg in a 24 hour period. You  can alternate these medications after surgery.  Diet: 1. Low sodium Heart Healthy Diet is recommended but you are cleared to resume your normal (before surgery) diet after your procedure.  2. It is safe to use a laxative, such as Miralax or Colace, if you have difficulty moving your bowels. You have been prescribed Sennakot-S to take at bedtime every evening after surgery to keep bowel movements regular and to prevent constipation.    Wound Care: 1. Keep clean and dry.  Shower daily.  Reasons to call the Doctor: Fever - Oral temperature greater than 100.4 degrees Fahrenheit Foul-smelling vaginal discharge Difficulty urinating Nausea and vomiting Increased pain at the site of the incision that is unrelieved with pain medicine. Difficulty breathing with or without chest pain New calf pain especially if only on one side Sudden, continuing increased vaginal bleeding with or without clots.   Contacts: For questions or concerns you should contact:  Dr. Bernadene Bell at [place], NP at [phone]  After Hours: call [phone] and have the GYN Oncologist paged/contacted (after 5 pm or on the weekends).  Messages sent via mychart are for non-urgent matters and are not responded to after hours so for urgent needs, please call the after hours number.

## 2022-01-26 NOTE — Progress Notes (Signed)
Patient here for a pre-operative appointment prior to her scheduled surgery on 02/10/2022. She is scheduled for a robotic assisted total laparoscopic hysterectomy, bilateral salpingo-oophorectomy, lymph node dissection, possible laparotomy. The surgery was discussed in detail.  See after visit summary for additional details. Visual aids used to discuss items related to surgery including the incentive spirometer, sequential compression stockings, foley catheter, IV pump, multi-modal pain regimen including tylenol, photo of the surgical robot, female reproductive system to discuss surgery in detail.      Discussed post-op pain management in detail including the aspects of the enhanced recovery pathway.  We discussed the use of tylenol post-op and to monitor for a maximum of 4,000 mg in a 24 hour period.  Also discussed that Senakot will be prescribed to be used after surgery and to hold if having loose stools.  Discussed bowel regimen in detail.     Discussed the use of heparin pre-op, SCDs, and measures to take at home to prevent DVT including frequent mobility.  Reportable signs and symptoms of DVT discussed. Post-operative instructions discussed and expectations for after surgery. Incisional care discussed as well including reportable signs and symptoms including erythema, drainage, wound separation.     30 minutes spent with the patient.  Verbalizing understanding of material discussed. No needs or concerns voiced at the end of the visit.   Advised patient and family to call for any needs.  Advised that her post-operative medications had been prescribed and could be picked up at any time.    This appointment is included in the global surgical bundle as pre-operative teaching and has no charge.

## 2022-01-27 LAB — SURGICAL PATHOLOGY

## 2022-01-28 ENCOUNTER — Encounter: Payer: Self-pay | Admitting: Psychiatry

## 2022-01-30 ENCOUNTER — Other Ambulatory Visit: Payer: Self-pay | Admitting: Gynecologic Oncology

## 2022-01-30 DIAGNOSIS — C541 Malignant neoplasm of endometrium: Secondary | ICD-10-CM

## 2022-01-30 NOTE — Patient Instructions (Signed)
DUE TO COVID-19 ONLY TWO VISITORS  (aged 51 and older)  ARE ALLOWED TO COME WITH YOU AND STAY IN THE WAITING ROOM ONLY DURING PRE OP AND PROCEDURE.   **NO VISITORS ARE ALLOWED IN THE SHORT STAY AREA OR RECOVERY ROOM!!**  IF YOU WILL BE ADMITTED INTO THE HOSPITAL YOU ARE ALLOWED ONLY FOUR SUPPORT PEOPLE DURING VISITATION HOURS ONLY (7 AM -8PM)   The support person(s) must pass our screening, gel in and out, and wear a mask at all times, including in the patient's room. Patients must also wear a mask when staff or their support person are in the room. Visitors GUEST BADGE MUST BE WORN VISIBLY  One adult visitor may remain with you overnight and MUST be in the room by 8 P.M.     Your procedure is scheduled on: 02/10/22   Report to [facility] Main Entrance    Report to admitting at: 8:15 AM   Call this number if you have problems the morning of surgery [phone]   Eat a light diet the day before surgery.  Examples including soups, broths, toast, yogurt, mashed potatoes.  Things to avoid include carbonated beverages (fizzy beverages), raw fruits and raw vegetables, or beans.   If your bowels are filled with gas, your surgeon will have difficulty visualizing your pelvic organs which increases your surgical risks. Do not eat food :After Midnight.   After Midnight you may have the following liquids until: 7:30 AM DAY OF SURGERY  Water Black Coffee (sugar ok, NO MILK/CREAM OR CREAMERS)  Tea (sugar ok, NO MILK/CREAM OR CREAMERS) regular and decaf                             Plain Jell-O (NO RED)                                           Fruit ices (not with fruit pulp, NO RED)                                     Popsicles (NO RED)                                                                  Juice: apple, WHITE grape, WHITE cranberry Sports drinks like Gatorade (NO RED)               Oral Hygiene is also important to reduce your risk of infection.                                     Remember - BRUSH YOUR TEETH THE MORNING OF SURGERY WITH YOUR REGULAR TOOTHPASTE  DENTURES WILL BE REMOVED PRIOR TO SURGERY PLEASE DO NOT APPLY "Poly grip" OR ADHESIVES!!!   Do NOT smoke after Midnight   Take these medicines the morning of surgery with A SIP OF WATER: N/A  You may not have any metal on your body including hair pins, jewelry, and body piercing             Do not wear make-up, lotions, powders, perfumes/cologne, or deodorant  Do not wear nail polish including gel and S&S, artificial/acrylic nails, or any other type of covering on natural nails including finger and toenails. If you have artificial nails, gel coating, etc. that needs to be removed by a nail salon please have this removed prior to surgery or surgery may need to be canceled/ delayed if the surgeon/ anesthesia feels like they are unable to be safely monitored.   Do not shave  48 hours prior to surgery.    Do not bring valuables to the hospital. [place].   Contacts, glasses, or bridgework may not be worn into surgery.   Bring small overnight bag day of surgery.   DO NOT [place]. PHARMACY WILL DISPENSE MEDICATIONS LISTED ON YOUR MEDICATION LIST TO YOU DURING YOUR ADMISSION [place]!    Patients discharged on the day of surgery will not be allowed to drive home.  Someone NEEDS to stay with you for the first 24 hours after anesthesia.   Special Instructions: Bring a copy of your healthcare power of attorney and living will documents         the day of surgery if you haven't scanned them before.              Please read over the following fact sheets you were given: IF YOU HAVE QUESTIONS ABOUT YOUR PRE-OP INSTRUCTIONS PLEASE CALL [phone]    [facility] - Preparing for Surgery Before surgery, you can play an important role.  Because skin is not sterile, your skin needs to be as free of  germs as possible.  You can reduce the number of germs on your skin by washing with CHG (chlorahexidine gluconate) soap before surgery.  CHG is an antiseptic cleaner which kills germs and bonds with the skin to continue killing germs even after washing. Please DO NOT use if you have an allergy to CHG or antibacterial soaps.  If your skin becomes reddened/irritated stop using the CHG and inform your nurse when you arrive at Short Stay. Do not shave (including legs and underarms) for at least 48 hours prior to the first CHG shower.  You may shave your face/neck. Please follow these instructions carefully:  1.  Shower with CHG Soap the night before surgery and the  morning of Surgery.  2.  If you choose to wash your hair, wash your hair first as usual with your  normal  shampoo.  3.  After you shampoo, rinse your hair and body thoroughly to remove the  shampoo.                           4.  Use CHG as you would any other liquid soap.  You can apply chg directly  to the skin and wash                       Gently with a scrungie or clean washcloth.  5.  Apply the CHG Soap to your body ONLY FROM THE NECK DOWN.   Do not use on face/ open  Wound or open sores. Avoid contact with eyes, ears mouth and genitals (private parts).                       Wash face,  Genitals (private parts) with your normal soap.             6.  Wash thoroughly, paying special attention to the area where your surgery  will be performed.  7.  Thoroughly rinse your body with warm water from the neck down.  8.  DO NOT shower/wash with your normal soap after using and rinsing off  the CHG Soap.                9.  Pat yourself dry with a clean towel.            10.  Wear clean pajamas.            11.  Place clean sheets on your bed the night of your first shower and do not  sleep with pets. Day of Surgery : Do not apply any lotions/deodorants the morning of surgery.  Please wear clean clothes to the  hospital/surgery center.  FAILURE TO FOLLOW THESE INSTRUCTIONS MAY RESULT IN THE CANCELLATION OF YOUR SURGERY PATIENT SIGNATURE_________________________________  NURSE SIGNATURE__________________________________  ________________________________________________________________________ WHAT IS A BLOOD TRANSFUSION? Blood Transfusion Information  A transfusion is the replacement of blood or some of its parts. Blood is made up of multiple cells which provide different functions. Red blood cells carry oxygen and are used for blood loss replacement. White blood cells fight against infection. Platelets control bleeding. Plasma helps clot blood. Other blood products are available for specialized needs, such as hemophilia or other clotting disorders. BEFORE THE TRANSFUSION  Who gives blood for transfusions?  Healthy volunteers who are fully evaluated to make sure their blood is safe. This is blood bank blood. Transfusion therapy is the safest it has ever been in the practice of medicine. Before blood is taken from a donor, a complete history is taken to make sure that person has no history of diseases nor engages in risky social behavior (examples are intravenous drug use or sexual activity with multiple partners). The donor's travel history is screened to minimize risk of transmitting infections, such as malaria. The donated blood is tested for signs of infectious diseases, such as HIV and hepatitis. The blood is then tested to be sure it is compatible with you in order to minimize the chance of a transfusion reaction. If you or a relative donates blood, this is often done in anticipation of surgery and is not appropriate for emergency situations. It takes many days to process the donated blood. RISKS AND COMPLICATIONS Although transfusion therapy is very safe and saves many lives, the main dangers of transfusion include:  Getting an infectious disease. Developing a transfusion reaction. This is an  allergic reaction to something in the blood you were given. Every precaution is taken to prevent this. The decision to have a blood transfusion has been considered carefully by your caregiver before blood is given. Blood is not given unless the benefits outweigh the risks. AFTER THE TRANSFUSION Right after receiving a blood transfusion, you will usually feel much better and more energetic. This is especially true if your red blood cells have gotten low (anemic). The transfusion raises the level of the red blood cells which carry oxygen, and this usually causes an energy increase. The nurse administering the transfusion will monitor you carefully for complications.  HOME CARE INSTRUCTIONS  No special instructions are needed after a transfusion. You may find your energy is better. Speak with your caregiver about any limitations on activity for underlying diseases you may have. SEEK MEDICAL CARE IF:  Your condition is not improving after your transfusion. You develop redness or irritation at the intravenous (IV) site. SEEK IMMEDIATE MEDICAL CARE IF:  Any of the following symptoms occur over the next 12 hours: Shaking chills. You have a temperature by mouth above 102 F (38.9 C), not controlled by medicine. Chest, back, or muscle pain. People around you feel you are not acting correctly or are confused. Shortness of breath or difficulty breathing. Dizziness and fainting. You get a rash or develop hives. You have a decrease in urine output. Your urine turns a dark color or changes to pink, red, or brown. Any of the following symptoms occur over the next 10 days: You have a temperature by mouth above 102 F (38.9 C), not controlled by medicine. Shortness of breath. Weakness after normal activity. The white part of the eye turns yellow (jaundice). You have a decrease in the amount of urine or are urinating less often. Your urine turns a dark color or changes to pink, red, or brown. Document  Released: 12/20/1999 Document Revised: 03/16/2011 Document Reviewed: 08/08/2007 [facility] Patient Information 2014 [place], [place].  _______________________________________________________________________

## 2022-02-02 ENCOUNTER — Other Ambulatory Visit: Payer: Self-pay

## 2022-02-02 ENCOUNTER — Encounter (HOSPITAL_COMMUNITY): Payer: Self-pay

## 2022-02-02 ENCOUNTER — Encounter (HOSPITAL_COMMUNITY)
Admission: RE | Admit: 2022-02-02 | Discharge: 2022-02-02 | Disposition: A | Discharge status: Home / Self Care | Payer: Self-pay | Source: Ambulatory Visit | Attending: Psychiatry | Admitting: Psychiatry

## 2022-02-02 DIAGNOSIS — C775 Secondary and unspecified malignant neoplasm of intrapelvic lymph nodes: Secondary | ICD-10-CM | POA: Insufficient documentation

## 2022-02-02 DIAGNOSIS — C541 Malignant neoplasm of endometrium: Secondary | ICD-10-CM | POA: Insufficient documentation

## 2022-02-02 DIAGNOSIS — Z01812 Encounter for preprocedural laboratory examination: Secondary | ICD-10-CM | POA: Insufficient documentation

## 2022-02-02 HISTORY — DX: Tachycardia, unspecified: R00.0

## 2022-02-02 HISTORY — DX: Dyspnea, unspecified: R06.00

## 2022-02-02 HISTORY — DX: Other fatigue: R53.83

## 2022-02-02 HISTORY — DX: Anesthesia of skin: R20.0

## 2022-02-02 HISTORY — DX: Unspecified amblyopia, left eye: H53.002

## 2022-02-02 HISTORY — DX: Unspecified hearing loss, unspecified ear: H91.90

## 2022-02-02 HISTORY — DX: Malignant neoplasm of endometrium: C54.1

## 2022-02-02 HISTORY — DX: Personal history of urinary calculi: Z87.442

## 2022-02-02 HISTORY — DX: Migraine, unspecified, not intractable, without status migrainosus: G43.909

## 2022-02-02 LAB — COMPREHENSIVE METABOLIC PANEL
ALT: 17 U/L (ref 0–44)
AST: 17 U/L (ref 15–41)
Albumin: 3.4 g/dL — ABNORMAL LOW (ref 3.5–5.0)
Alkaline Phosphatase: 68 U/L (ref 38–126)
Anion gap: 8 (ref 5–15)
BUN: 6 mg/dL (ref 6–20)
CO2: 23 mmol/L (ref 22–32)
Calcium: 8.7 mg/dL — ABNORMAL LOW (ref 8.9–10.3)
Chloride: 102 mmol/L (ref 98–111)
Creatinine, Ser: 0.39 mg/dL — ABNORMAL LOW (ref 0.44–1.00)
GFR, Estimated: >60 mL/min (ref >60)
Glucose, Bld: 165 mg/dL — ABNORMAL HIGH (ref 70–99)
Potassium: 4.2 mmol/L (ref 3.5–5.1)
Sodium: 133 mmol/L — ABNORMAL LOW (ref 135–145)
Total Bilirubin: 0.3 mg/dL (ref 0.3–1.2)
Total Protein: 7.8 g/dL (ref 6.5–8.1)

## 2022-02-02 LAB — CBC
HCT: 30.1 % — ABNORMAL LOW (ref 36.0–46.0)
Hemoglobin: 8.2 g/dL — ABNORMAL LOW (ref 12.0–15.0)
MCH: 18.1 pg — ABNORMAL LOW (ref 26.0–34.0)
MCHC: 27.2 g/dL — ABNORMAL LOW (ref 30.0–36.0)
MCV: 66.6 fL — ABNORMAL LOW (ref 80.0–100.0)
Platelets: 643 10*3/uL — ABNORMAL HIGH (ref 150–400)
RBC: 4.52 MIL/uL (ref 3.87–5.11)
RDW: 17.7 % — ABNORMAL HIGH (ref 11.5–15.5)
WBC: 10 10*3/uL (ref 4.0–10.5)
nRBC: 0 % (ref 0.0–0.2)

## 2022-02-02 NOTE — Progress Notes (Addendum)
For Short Stay: [place] appointment date: N/A  Bowel Prep reminder: N/A   For Anesthesia: PCP - N/A Cardiologist - N/A  Chest x-ray - N/A CT Chest-01/23/22 in [facility] EKG - N/A Stress Test - N/A ECHO - N/A Cardiac Cath - N/A Pacemaker/ICD device last checked:N/A Pacemaker orders received: N/A Device Rep notified: N/A  Spinal Cord Stimulator: N/A  Sleep Study - N/A CPAP - N/A  Fasting Blood Sugar - N/A Checks Blood Sugar __N/A___ times a day Date and result of last Hgb A1c-N/A  Last dose of GLP1 agonist- N/A GLP1 instructions: N/A  Last dose of SGLT-2 inhibitors- N/A SGLT-2 instructions: N/A  Blood Thinner Instructions:N/A Aspirin Instructions: N/A Last Dose: N/A  Activity level: Unable to go up a flight of stairs without chest pain and/or shortness of breath     Anesthesia review: N/A  Patient denies shortness of breath, fever, cough and chest pain at PAT appointment   Patient verbalized understanding of instructions that were given to them at the PAT appointment. Patient was also instructed that they will need to review over the PAT instructions again at home before surgery.

## 2022-02-04 ENCOUNTER — Other Ambulatory Visit: Payer: Self-pay | Admitting: Psychiatry

## 2022-02-04 ENCOUNTER — Telehealth: Payer: Self-pay | Admitting: *Deleted

## 2022-02-04 DIAGNOSIS — D500 Iron deficiency anemia secondary to blood loss (chronic): Secondary | ICD-10-CM

## 2022-02-04 MED ORDER — FERROUS SULFATE 325 (65 FE) MG PO TBEC: 325.0000 mg | DELAYED_RELEASE_TABLET | Freq: Two times a day (BID) | ORAL | 3 refills | Status: DC

## 2022-02-04 NOTE — Telephone Encounter (Signed)
Call to patient per instruction Dr Ernestina Patches.  Labwork indicates anemia and patient needs to begin iron supplement that has been called in.  Advised to take twice daily ( avoid milk/calcium and try to take with orange juice/vitamin C to increase absorption) will need to take until surgery and potentially longer. RX has been sent to [facility].  Patient verbalized understanding.

## 2022-02-04 NOTE — Progress Notes (Signed)
Joylene John NP notified me they have added a Prepare RBC order, I spoke with Beth in the blood bank to make them aware of the orders for 02/10/22. Eustaquio Maize has made a note on the pink sheet in the blood bank.

## 2022-02-06 ENCOUNTER — Telehealth: Payer: Self-pay | Admitting: Oncology

## 2022-02-06 ENCOUNTER — Telehealth: Payer: Self-pay | Admitting: Psychiatry

## 2022-02-06 ENCOUNTER — Ambulatory Visit (HOSPITAL_COMMUNITY)
Admission: RE | Admit: 2022-02-06 | Discharge: 2022-02-06 | Disposition: A | Discharge status: Home / Self Care | Payer: Self-pay | Source: Ambulatory Visit | Attending: Psychiatry | Admitting: Psychiatry

## 2022-02-06 DIAGNOSIS — C541 Malignant neoplasm of endometrium: Secondary | ICD-10-CM | POA: Insufficient documentation

## 2022-02-06 DIAGNOSIS — R590 Localized enlarged lymph nodes: Secondary | ICD-10-CM | POA: Insufficient documentation

## 2022-02-06 DIAGNOSIS — C775 Secondary and unspecified malignant neoplasm of intrapelvic lymph nodes: Secondary | ICD-10-CM | POA: Insufficient documentation

## 2022-02-06 LAB — GLUCOSE, CAPILLARY: Glucose-Capillary: 169 mg/dL — ABNORMAL HIGH (ref 70–99)

## 2022-02-06 MED ORDER — FLUDEOXYGLUCOSE F - 18 (FDG) INJECTION
11.5700 | Freq: Once | INTRAVENOUS | Status: AC
  Administered 2022-02-06: 11.57 via INTRAVENOUS

## 2022-02-06 NOTE — Telephone Encounter (Signed)
Called pt regarding her PET results. Positive nodes in pelvis that were enlarged appear positive. No concerning mediastinal nodes. Okay to proceed with surgery on Tuesday

## 2022-02-06 NOTE — Telephone Encounter (Signed)
[name] (daughter) called and asked about the results of the PET scan and if Maureen Campos will be having surgery.  Reviewed PET scan Impression with her.  Discussed that Dr. Ernestina Patches is out of the office today but will be notified that they would like to discuss the PET results and whether Maureen Campos will be having surgery on Tuesday.

## 2022-02-09 ENCOUNTER — Telehealth: Payer: Self-pay | Admitting: Surgery

## 2022-02-09 NOTE — Anesthesia Preprocedure Evaluation (Signed)
Anesthesia Evaluation  Patient identified by MRN, date of birth, ID band Patient awake    Reviewed: Allergy & Precautions, NPO status , Patient's Chart, lab work & pertinent test results  History of Anesthesia Complications Negative for: history of anesthetic complications  Airway Mallampati: II  TM Distance: >3 FB Neck ROM: Full    Dental  (+) Poor Dentition, Loose, Dental Advisory Given, Missing   Pulmonary neg pulmonary ROS   breath sounds clear to auscultation       Cardiovascular (-) angina negative cardio ROS  Rhythm:Regular Rate:Normal     Neuro/Psych  Headaches    GI/Hepatic Neg liver ROS,GERD  Controlled,,  Endo/Other    Morbid obesityBMI 68  Renal/GU H/o stones     Musculoskeletal   Abdominal  (+) + obese  Peds  Hematology  (+) Blood dyscrasia (Hb 8.2), anemia   Anesthesia Other Findings   Reproductive/Obstetrics Endometrial cancer                             Anesthesia Physical Anesthesia Plan  ASA: 3  Anesthesia Plan: General   Post-op Pain Management: Tylenol PO (pre-op)*   Induction: Intravenous  PONV Risk Score and Plan: 3 and Ondansetron, Dexamethasone and Scopolamine patch - Pre-op  Airway Management Planned: Oral ETT  Additional Equipment: None  Intra-op Plan:   Post-operative Plan: Extubation in OR  Informed Consent: I have reviewed the patients History and Physical, chart, labs and discussed the procedure including the risks, benefits and alternatives for the proposed anesthesia with the patient or authorized representative who has indicated his/her understanding and acceptance.     Dental advisory given  Plan Discussed with: CRNA and Surgeon  Anesthesia Plan Comments: (Pt understands dental risk of loss, given extremely loose teeth)       Anesthesia Quick Evaluation

## 2022-02-09 NOTE — Telephone Encounter (Signed)
Telephone call to check on pre-operative status.  Patient compliant with pre-operative instructions.  Reinforced nothing to eat after midnight. Clear liquids until 4:15am. Patient to arrive at 5:15am. Verified that post-op medications have been sent to patient's preferred pharmacy.  No questions or concerns voiced.  Instructed to call for any needs. 

## 2022-02-10 ENCOUNTER — Ambulatory Visit (HOSPITAL_COMMUNITY): Payer: Self-pay | Admitting: Anesthesiology

## 2022-02-10 ENCOUNTER — Encounter (HOSPITAL_COMMUNITY)
Admission: RE | Disposition: A | Discharge status: Home / Self Care | Payer: Self-pay | Source: Ambulatory Visit | Attending: Psychiatry

## 2022-02-10 ENCOUNTER — Other Ambulatory Visit: Payer: Self-pay

## 2022-02-10 ENCOUNTER — Encounter (HOSPITAL_COMMUNITY): Payer: Self-pay | Admitting: Psychiatry

## 2022-02-10 ENCOUNTER — Inpatient Hospital Stay (HOSPITAL_COMMUNITY)
Admission: RE | Admit: 2022-02-10 | Discharge: 2022-02-12 | DRG: 740 | Disposition: A | Discharge status: Home / Self Care | Payer: Self-pay | Source: Ambulatory Visit | Attending: Gynecologic Oncology | Admitting: Gynecologic Oncology

## 2022-02-10 DIAGNOSIS — Z6841 Body Mass Index (BMI) 40.0 and over, adult: Secondary | ICD-10-CM

## 2022-02-10 DIAGNOSIS — D649 Anemia, unspecified: Principal | ICD-10-CM

## 2022-02-10 DIAGNOSIS — Z9049 Acquired absence of other specified parts of digestive tract: Secondary | ICD-10-CM

## 2022-02-10 DIAGNOSIS — N281 Cyst of kidney, acquired: Secondary | ICD-10-CM | POA: Diagnosis present

## 2022-02-10 DIAGNOSIS — D630 Anemia in neoplastic disease: Secondary | ICD-10-CM

## 2022-02-10 DIAGNOSIS — C775 Secondary and unspecified malignant neoplasm of intrapelvic lymph nodes: Secondary | ICD-10-CM | POA: Diagnosis present

## 2022-02-10 DIAGNOSIS — C541 Malignant neoplasm of endometrium: Principal | ICD-10-CM | POA: Diagnosis present

## 2022-02-10 DIAGNOSIS — Z79899 Other long term (current) drug therapy: Secondary | ICD-10-CM

## 2022-02-10 DIAGNOSIS — R102 Pelvic and perineal pain: Secondary | ICD-10-CM

## 2022-02-10 DIAGNOSIS — D62 Acute posthemorrhagic anemia: Secondary | ICD-10-CM | POA: Diagnosis not present

## 2022-02-10 HISTORY — PX: ROBOTIC PELVIC AND PARA-AORTIC LYMPH NODE DISSECTION: SHX6210

## 2022-02-10 HISTORY — PX: ROBOTIC ASSISTED TOTAL HYSTERECTOMY WITH BILATERAL SALPINGO OOPHERECTOMY: SHX6086

## 2022-02-10 LAB — POCT PREGNANCY, URINE: Preg Test, Ur: NEGATIVE

## 2022-02-10 LAB — PREPARE RBC (CROSSMATCH)

## 2022-02-10 LAB — ABO/RH: ABO/RH(D): A POS

## 2022-02-10 SURGERY — HYSTERECTOMY, TOTAL, ROBOT-ASSISTED, LAPAROSCOPIC, WITH BILATERAL SALPINGO-OOPHORECTOMY
Anesthesia: General

## 2022-02-10 MED ORDER — ONDANSETRON HCL 4 MG/2ML IJ SOLN
INTRAMUSCULAR | Status: AC
  Filled 2022-02-10: qty 2

## 2022-02-10 MED ORDER — SODIUM CHLORIDE 0.9 % IV SOLN: INTRAVENOUS | Status: DC | PRN

## 2022-02-10 MED ORDER — DEXAMETHASONE SODIUM PHOSPHATE 10 MG/ML IJ SOLN
INTRAMUSCULAR | Status: AC
  Filled 2022-02-10: qty 1

## 2022-02-10 MED ORDER — ENOXAPARIN SODIUM 40 MG/0.4ML IJ SOSY
40.0000 mg | PREFILLED_SYRINGE | INTRAMUSCULAR | Status: DC
  Administered 2022-02-11: 40 mg via SUBCUTANEOUS
  Filled 2022-02-10: qty 0.4

## 2022-02-10 MED ORDER — DICLOFENAC SODIUM 50 MG PO TBEC
50.0000 mg | DELAYED_RELEASE_TABLET | Freq: Three times a day (TID) | ORAL | Status: DC
  Administered 2022-02-10: 50 mg via ORAL
  Filled 2022-02-10: qty 1

## 2022-02-10 MED ORDER — ALBUTEROL SULFATE HFA 108 (90 BASE) MCG/ACT IN AERS
INHALATION_SPRAY | RESPIRATORY_TRACT | Status: AC
  Filled 2022-02-10: qty 6.7

## 2022-02-10 MED ORDER — PROMETHAZINE HCL 25 MG/ML IJ SOLN: 6.2500 mg | INTRAMUSCULAR | Status: DC | PRN

## 2022-02-10 MED ORDER — PROPOFOL 10 MG/ML IV BOLUS
INTRAVENOUS | Status: DC | PRN
  Administered 2022-02-10: 150 mg via INTRAVENOUS

## 2022-02-10 MED ORDER — ROCURONIUM BROMIDE 10 MG/ML (PF) SYRINGE
PREFILLED_SYRINGE | INTRAVENOUS | Status: DC | PRN
  Administered 2022-02-10 (×4): 20 mg via INTRAVENOUS
  Administered 2022-02-10: 10 mg via INTRAVENOUS
  Administered 2022-02-10: 20 mg via INTRAVENOUS
  Administered 2022-02-10: 10 mg via INTRAVENOUS
  Administered 2022-02-10: 20 mg via INTRAVENOUS
  Administered 2022-02-10: 70 mg via INTRAVENOUS
  Administered 2022-02-10: 20 mg via INTRAVENOUS

## 2022-02-10 MED ORDER — MIDAZOLAM HCL 2 MG/2ML IJ SOLN: 0.5000 mg | Freq: Once | INTRAMUSCULAR | Status: DC | PRN

## 2022-02-10 MED ORDER — LIDOCAINE HCL (PF) 2 % IJ SOLN
INTRAMUSCULAR | Status: AC
  Filled 2022-02-10: qty 20

## 2022-02-10 MED ORDER — ORAL CARE MOUTH RINSE: 15.0000 mL | Freq: Once | OROMUCOSAL | Status: AC

## 2022-02-10 MED ORDER — SCOPOLAMINE 1 MG/3DAYS TD PT72: 1.0000 | MEDICATED_PATCH | TRANSDERMAL | Status: DC

## 2022-02-10 MED ORDER — KCL IN DEXTROSE-NACL 20-5-0.45 MEQ/L-%-% IV SOLN
INTRAVENOUS | Status: DC
  Filled 2022-02-10 (×2): qty 1000

## 2022-02-10 MED ORDER — CEFAZOLIN SODIUM 1 G IJ SOLR
INTRAMUSCULAR | Status: AC
  Filled 2022-02-10: qty 20

## 2022-02-10 MED ORDER — IBUPROFEN 400 MG PO TABS
600.0000 mg | ORAL_TABLET | Freq: Four times a day (QID) | ORAL | Status: DC
  Administered 2022-02-11 – 2022-02-12 (×5): 600 mg via ORAL
  Filled 2022-02-10 (×6): qty 1

## 2022-02-10 MED ORDER — SUGAMMADEX SODIUM 500 MG/5ML IV SOLN
INTRAVENOUS | Status: DC | PRN
  Administered 2022-02-10: 250 mg via INTRAVENOUS

## 2022-02-10 MED ORDER — OXYCODONE HCL 5 MG PO TABS
ORAL_TABLET | ORAL | Status: AC
  Filled 2022-02-10: qty 1

## 2022-02-10 MED ORDER — FENTANYL CITRATE (PF) 100 MCG/2ML IJ SOLN
INTRAMUSCULAR | Status: AC
  Filled 2022-02-10: qty 2

## 2022-02-10 MED ORDER — BUPIVACAINE HCL 0.25 % IJ SOLN
INTRAMUSCULAR | Status: AC
  Filled 2022-02-10: qty 1

## 2022-02-10 MED ORDER — SCOPOLAMINE 1 MG/3DAYS TD PT72
1.0000 | MEDICATED_PATCH | TRANSDERMAL | Status: DC
  Administered 2022-02-10: 1.5 mg via TRANSDERMAL
  Filled 2022-02-10: qty 1

## 2022-02-10 MED ORDER — HYDROMORPHONE HCL 1 MG/ML IJ SOLN
0.5000 mg | INTRAMUSCULAR | Status: DC | PRN
  Administered 2022-02-10: 0.5 mg via INTRAVENOUS
  Filled 2022-02-10: qty 0.5

## 2022-02-10 MED ORDER — STERILE WATER FOR INJECTION IJ SOLN
INTRAMUSCULAR | Status: DC | PRN
  Administered 2022-02-10: 10 mL via INTRAMUSCULAR

## 2022-02-10 MED ORDER — DEXAMETHASONE SODIUM PHOSPHATE 10 MG/ML IJ SOLN
INTRAMUSCULAR | Status: DC | PRN
  Administered 2022-02-10: 10 mg via INTRAVENOUS

## 2022-02-10 MED ORDER — LACTATED RINGERS IV SOLN: INTRAVENOUS | Status: DC

## 2022-02-10 MED ORDER — KETAMINE HCL 50 MG/5ML IJ SOSY
PREFILLED_SYRINGE | INTRAMUSCULAR | Status: AC
  Filled 2022-02-10: qty 5

## 2022-02-10 MED ORDER — PROPOFOL 10 MG/ML IV BOLUS
INTRAVENOUS | Status: AC
  Filled 2022-02-10: qty 20

## 2022-02-10 MED ORDER — ONDANSETRON HCL 4 MG/2ML IJ SOLN: 4.0000 mg | Freq: Four times a day (QID) | INTRAMUSCULAR | Status: DC | PRN

## 2022-02-10 MED ORDER — CEFAZOLIN SODIUM-DEXTROSE 2-4 GM/100ML-% IV SOLN
2.0000 g | INTRAVENOUS | Status: AC
  Administered 2022-02-10 (×2): 2 g via INTRAVENOUS
  Filled 2022-02-10: qty 100

## 2022-02-10 MED ORDER — MIDAZOLAM HCL 2 MG/2ML IJ SOLN
INTRAMUSCULAR | Status: AC
  Filled 2022-02-10: qty 2

## 2022-02-10 MED ORDER — SENNOSIDES-DOCUSATE SODIUM 8.6-50 MG PO TABS
2.0000 | ORAL_TABLET | Freq: Every day | ORAL | Status: DC
  Administered 2022-02-10 – 2022-02-11 (×2): 2 via ORAL
  Filled 2022-02-10 (×2): qty 2

## 2022-02-10 MED ORDER — GABAPENTIN 300 MG PO CAPS
300.0000 mg | ORAL_CAPSULE | Freq: Three times a day (TID) | ORAL | Status: DC
  Administered 2022-02-10: 300 mg via ORAL
  Filled 2022-02-10: qty 1

## 2022-02-10 MED ORDER — SODIUM CHLORIDE 0.9 % IV SOLN: 10.0000 mL/h | Freq: Once | INTRAVENOUS | Status: DC

## 2022-02-10 MED ORDER — BUPIVACAINE HCL 0.25 % IJ SOLN
INTRAMUSCULAR | Status: DC | PRN
  Administered 2022-02-10: 25 mL

## 2022-02-10 MED ORDER — DEXAMETHASONE SODIUM PHOSPHATE 4 MG/ML IJ SOLN: 4.0000 mg | INTRAMUSCULAR | Status: DC

## 2022-02-10 MED ORDER — MEPERIDINE HCL 50 MG/ML IJ SOLN: 6.2500 mg | INTRAMUSCULAR | Status: DC | PRN

## 2022-02-10 MED ORDER — HYDROMORPHONE HCL 1 MG/ML IJ SOLN
0.5000 mg | INTRAMUSCULAR | Status: DC | PRN
  Administered 2022-02-11: 0.5 mg via INTRAVENOUS
  Filled 2022-02-10: qty 0.5

## 2022-02-10 MED ORDER — STERILE WATER FOR IRRIGATION IR SOLN
Status: DC | PRN
  Administered 2022-02-10: 1000 mL

## 2022-02-10 MED ORDER — OXYCODONE HCL 5 MG PO TABS
5.0000 mg | ORAL_TABLET | ORAL | Status: DC | PRN
  Administered 2022-02-10: 5 mg via ORAL
  Filled 2022-02-10: qty 1

## 2022-02-10 MED ORDER — ACETAMINOPHEN 500 MG PO TABS
1000.0000 mg | ORAL_TABLET | ORAL | Status: AC
  Administered 2022-02-10: 1000 mg via ORAL
  Filled 2022-02-10: qty 2

## 2022-02-10 MED ORDER — PHENYLEPHRINE 80 MCG/ML (10ML) SYRINGE FOR IV PUSH (FOR BLOOD PRESSURE SUPPORT)
PREFILLED_SYRINGE | INTRAVENOUS | Status: AC
  Filled 2022-02-10: qty 10

## 2022-02-10 MED ORDER — OXYCODONE HCL 5 MG PO TABS
5.0000 mg | ORAL_TABLET | ORAL | Status: DC | PRN
  Administered 2022-02-11 (×3): 5 mg via ORAL
  Filled 2022-02-10 (×3): qty 1

## 2022-02-10 MED ORDER — MIDAZOLAM HCL 2 MG/2ML IJ SOLN
INTRAMUSCULAR | Status: DC | PRN
  Administered 2022-02-10: 2 mg via INTRAVENOUS

## 2022-02-10 MED ORDER — LABETALOL HCL 5 MG/ML IV SOLN: 5.0000 mg | INTRAVENOUS | Status: DC | PRN

## 2022-02-10 MED ORDER — ALBUTEROL SULFATE HFA 108 (90 BASE) MCG/ACT IN AERS
INHALATION_SPRAY | RESPIRATORY_TRACT | Status: DC | PRN
  Administered 2022-02-10: 7 via RESPIRATORY_TRACT

## 2022-02-10 MED ORDER — ROCURONIUM BROMIDE 10 MG/ML (PF) SYRINGE
PREFILLED_SYRINGE | INTRAVENOUS | Status: AC
  Filled 2022-02-10: qty 10

## 2022-02-10 MED ORDER — HYDROMORPHONE HCL 1 MG/ML IJ SOLN
INTRAMUSCULAR | Status: AC
  Administered 2022-02-10: 0.5 mg via INTRAVENOUS
  Filled 2022-02-10: qty 1

## 2022-02-10 MED ORDER — TRAMADOL HCL 50 MG PO TABS: 50.0000 mg | ORAL_TABLET | Freq: Four times a day (QID) | ORAL | 0 refills | Status: DC | PRN

## 2022-02-10 MED ORDER — SENNOSIDES-DOCUSATE SODIUM 8.6-50 MG PO TABS: 2.0000 | ORAL_TABLET | Freq: Every day | ORAL | 0 refills | Status: DC

## 2022-02-10 MED ORDER — LIDOCAINE HCL (PF) 2 % IJ SOLN
INTRAMUSCULAR | Status: AC
  Filled 2022-02-10: qty 5

## 2022-02-10 MED ORDER — SODIUM CHLORIDE (PF) 0.9 % IJ SOLN
INTRAMUSCULAR | Status: AC
  Filled 2022-02-10: qty 10

## 2022-02-10 MED ORDER — FENTANYL CITRATE (PF) 250 MCG/5ML IJ SOLN
INTRAMUSCULAR | Status: DC | PRN
  Administered 2022-02-10 (×3): 50 ug via INTRAVENOUS
  Administered 2022-02-10: 150 ug via INTRAVENOUS
  Administered 2022-02-10 (×3): 50 ug via INTRAVENOUS

## 2022-02-10 MED ORDER — KETAMINE HCL 10 MG/ML IJ SOLN
INTRAMUSCULAR | Status: DC | PRN
  Administered 2022-02-10 (×2): 20 mg via INTRAVENOUS
  Administered 2022-02-10: 10 mg via INTRAVENOUS

## 2022-02-10 MED ORDER — LACTATED RINGERS IV SOLN: INTRAVENOUS | Status: DC | PRN

## 2022-02-10 MED ORDER — FENTANYL CITRATE (PF) 250 MCG/5ML IJ SOLN
INTRAMUSCULAR | Status: AC
  Filled 2022-02-10: qty 5

## 2022-02-10 MED ORDER — ACETAMINOPHEN 500 MG PO TABS: 1000.0000 mg | ORAL_TABLET | Freq: Once | ORAL | Status: DC

## 2022-02-10 MED ORDER — HEMOSTATIC AGENTS (NO CHARGE) OPTIME
TOPICAL | Status: DC | PRN
  Administered 2022-02-10: 1 via TOPICAL

## 2022-02-10 MED ORDER — ACETAMINOPHEN 500 MG PO TABS
1000.0000 mg | ORAL_TABLET | Freq: Four times a day (QID) | ORAL | Status: DC
  Administered 2022-02-10: 1000 mg via ORAL
  Filled 2022-02-10: qty 2

## 2022-02-10 MED ORDER — STERILE WATER FOR INJECTION IJ SOLN
INTRAMUSCULAR | Status: DC | PRN
  Administered 2022-02-10: 10 mL

## 2022-02-10 MED ORDER — LIDOCAINE 2% (20 MG/ML) 5 ML SYRINGE
INTRAMUSCULAR | Status: DC | PRN
  Administered 2022-02-10: 1.5 mg/kg/h via INTRAVENOUS
  Administered 2022-02-10: 40 mg via INTRAVENOUS

## 2022-02-10 MED ORDER — HYDROMORPHONE HCL 1 MG/ML IJ SOLN
0.2500 mg | INTRAMUSCULAR | Status: DC | PRN
  Administered 2022-02-10 (×2): 0.5 mg via INTRAVENOUS

## 2022-02-10 MED ORDER — OXYCODONE HCL 5 MG PO TABS
5.0000 mg | ORAL_TABLET | Freq: Once | ORAL | Status: AC | PRN
  Administered 2022-02-10: 5 mg via ORAL

## 2022-02-10 MED ORDER — SUGAMMADEX SODIUM 500 MG/5ML IV SOLN
INTRAVENOUS | Status: AC
  Filled 2022-02-10: qty 5

## 2022-02-10 MED ORDER — IBUPROFEN 800 MG PO TABS: 800.0000 mg | ORAL_TABLET | Freq: Three times a day (TID) | ORAL | 0 refills | Status: DC | PRN

## 2022-02-10 MED ORDER — GABAPENTIN 300 MG PO CAPS
300.0000 mg | ORAL_CAPSULE | ORAL | Status: AC
  Administered 2022-02-10: 300 mg via ORAL
  Filled 2022-02-10: qty 1

## 2022-02-10 MED ORDER — PHENYLEPHRINE 80 MCG/ML (10ML) SYRINGE FOR IV PUSH (FOR BLOOD PRESSURE SUPPORT)
PREFILLED_SYRINGE | INTRAVENOUS | Status: DC | PRN
  Administered 2022-02-10: 80 ug via INTRAVENOUS
  Administered 2022-02-10: 160 ug via INTRAVENOUS

## 2022-02-10 MED ORDER — HEPARIN SODIUM (PORCINE) 5000 UNIT/ML IJ SOLN
5000.0000 [IU] | INTRAMUSCULAR | Status: AC
  Administered 2022-02-10: 5000 [IU] via SUBCUTANEOUS
  Filled 2022-02-10: qty 1

## 2022-02-10 MED ORDER — LACTATED RINGERS IR SOLN
Status: DC | PRN
  Administered 2022-02-10: 1000 mL

## 2022-02-10 MED ORDER — ACETAMINOPHEN 325 MG PO TABS
650.0000 mg | ORAL_TABLET | Freq: Four times a day (QID) | ORAL | Status: DC
  Administered 2022-02-11 – 2022-02-12 (×5): 650 mg via ORAL
  Filled 2022-02-10 (×6): qty 2

## 2022-02-10 MED ORDER — CHLORHEXIDINE GLUCONATE 0.12 % MT SOLN
15.0000 mL | Freq: Once | OROMUCOSAL | Status: AC
  Administered 2022-02-10: 15 mL via OROMUCOSAL

## 2022-02-10 MED ORDER — PHENYLEPHRINE HCL (PRESSORS) 10 MG/ML IV SOLN: INTRAVENOUS | Status: DC | PRN

## 2022-02-10 MED ORDER — ONDANSETRON HCL 4 MG PO TABS: 4.0000 mg | ORAL_TABLET | Freq: Four times a day (QID) | ORAL | Status: DC | PRN

## 2022-02-10 MED ORDER — OXYCODONE HCL 5 MG/5ML PO SOLN: 5.0000 mg | Freq: Once | ORAL | Status: AC | PRN

## 2022-02-10 MED ORDER — ONDANSETRON HCL 4 MG/2ML IJ SOLN
INTRAMUSCULAR | Status: DC | PRN
  Administered 2022-02-10: 4 mg via INTRAVENOUS

## 2022-02-10 MED ORDER — KETOROLAC TROMETHAMINE 15 MG/ML IJ SOLN
15.0000 mg | INTRAMUSCULAR | Status: AC
  Administered 2022-02-10: 15 mg via INTRAVENOUS
  Filled 2022-02-10: qty 1

## 2022-02-10 SURGICAL SUPPLY — 83 items
ADH SKN CLS APL DERMABOND .7 (GAUZE/BANDAGES/DRESSINGS) ×1
AGENT HMST KT MTR STRL THRMB (HEMOSTASIS) ×1
APL ESCP 34 STRL LF DISP (HEMOSTASIS) ×1
APPLICATOR SURGIFLO ENDO (HEMOSTASIS) IMPLANT
BAG LAPAROSCOPIC 12 15 PORT 16 (BASKET) IMPLANT
BAG RETRIEVAL 12/15 (BASKET) ×1
BLADE SURG SZ10 CARB STEEL (BLADE) IMPLANT
COVER BACK TABLE 60X90IN (DRAPES) ×2 IMPLANT
COVER TIP SHEARS 8 DVNC (MISCELLANEOUS) ×2 IMPLANT
COVER TIP SHEARS 8MM DA VINCI (MISCELLANEOUS) ×1
DERMABOND ADVANCED .7 DNX12 (GAUZE/BANDAGES/DRESSINGS) ×2 IMPLANT
DRAPE ARM DVNC X/XI (DISPOSABLE) ×8 IMPLANT
DRAPE COLUMN DVNC XI (DISPOSABLE) ×2 IMPLANT
DRAPE DA VINCI XI ARM (DISPOSABLE) ×4
DRAPE DA VINCI XI COLUMN (DISPOSABLE) ×1
DRAPE SHEET LG 3/4 BI-LAMINATE (DRAPES) ×2 IMPLANT
DRAPE SURG IRRIG POUCH 19X23 (DRAPES) ×2 IMPLANT
DRSG OPSITE POSTOP 4X6 (GAUZE/BANDAGES/DRESSINGS) IMPLANT
DRSG OPSITE POSTOP 4X8 (GAUZE/BANDAGES/DRESSINGS) IMPLANT
ELECT PENCIL ROCKER SW 15FT (MISCELLANEOUS) IMPLANT
ELECT REM PT RETURN 15FT ADLT (MISCELLANEOUS) ×2 IMPLANT
GAUZE 4X4 16PLY ~~LOC~~+RFID DBL (SPONGE) ×2 IMPLANT
GLOVE BIO SURGEON STRL SZ 6 (GLOVE) ×8 IMPLANT
GLOVE BIO SURGEON STRL SZ 6.5 (GLOVE) ×2 IMPLANT
GLOVE BIOGEL PI IND STRL 6.5 (GLOVE) ×4 IMPLANT
GOWN STRL REUS W/ TWL LRG LVL3 (GOWN DISPOSABLE) ×8 IMPLANT
GOWN STRL REUS W/TWL LRG LVL3 (GOWN DISPOSABLE) ×7
GRASPER SUT TROCAR 14GX15 (MISCELLANEOUS) IMPLANT
HOLDER FOLEY CATH W/STRAP (MISCELLANEOUS) IMPLANT
IRRIG SUCT STRYKERFLOW 2 WTIP (MISCELLANEOUS) ×1
IRRIGATION SUCT STRKRFLW 2 WTP (MISCELLANEOUS) ×2 IMPLANT
KIT PROCEDURE DA VINCI SI (MISCELLANEOUS) ×1
KIT PROCEDURE DVNC SI (MISCELLANEOUS) IMPLANT
KIT TURNOVER KIT A (KITS) IMPLANT
LIGASURE IMPACT 36 18CM CVD LR (INSTRUMENTS) IMPLANT
MANIPULATOR ADVINCU DEL 3.0 PL (MISCELLANEOUS) IMPLANT
MANIPULATOR ADVINCU DEL 3.5 PL (MISCELLANEOUS) IMPLANT
MANIPULATOR UTERINE 4.5 ZUMI (MISCELLANEOUS) IMPLANT
NDL HYPO 21X1.5 SAFETY (NEEDLE) ×2 IMPLANT
NDL INSUFFLATION 14GA 120MM (NEEDLE) IMPLANT
NDL SPNL 18GX3.5 QUINCKE PK (NEEDLE) IMPLANT
NEEDLE HYPO 21X1.5 SAFETY (NEEDLE) ×1 IMPLANT
NEEDLE INSUFFLATION 14GA 120MM (NEEDLE) IMPLANT
NEEDLE SPNL 18GX3.5 QUINCKE PK (NEEDLE) ×1 IMPLANT
OBTURATOR OPTICAL STANDARD 8MM (TROCAR) ×1
OBTURATOR OPTICAL STND 8 DVNC (TROCAR) ×1
OBTURATOR OPTICALSTD 8 DVNC (TROCAR) ×2 IMPLANT
PACK ROBOT GYN CUSTOM WL (TRAY / TRAY PROCEDURE) ×2 IMPLANT
PAD ARMBOARD 7.5X6 YLW CONV (MISCELLANEOUS) ×2 IMPLANT
PAD POSITIONING PINK XL (MISCELLANEOUS) ×2 IMPLANT
PORT ACCESS TROCAR AIRSEAL 12 (TROCAR) IMPLANT
SCRUB CHG 4% DYNA-HEX 4OZ (MISCELLANEOUS) IMPLANT
SEAL CANN UNIV 5-8 DVNC XI (MISCELLANEOUS) ×8 IMPLANT
SEAL XI 5MM-8MM UNIVERSAL (MISCELLANEOUS) ×4
SET TRI-LUMEN FLTR TB AIRSEAL (TUBING) ×2 IMPLANT
SOL PREP POV-IOD 4OZ 10% (MISCELLANEOUS) ×4 IMPLANT
SPIKE FLUID TRANSFER (MISCELLANEOUS) ×2 IMPLANT
SPONGE T-LAP 18X18 ~~LOC~~+RFID (SPONGE) IMPLANT
SURGIFLO W/THROMBIN 8M KIT (HEMOSTASIS) IMPLANT
SUT MNCRL AB 4-0 PS2 18 (SUTURE) IMPLANT
SUT PDS AB 1 TP1 96 (SUTURE) IMPLANT
SUT VIC AB 0 CT1 27 (SUTURE) ×1
SUT VIC AB 0 CT1 27XBRD ANTBC (SUTURE) IMPLANT
SUT VIC AB 2-0 CT1 27 (SUTURE)
SUT VIC AB 2-0 CT1 TAPERPNT 27 (SUTURE) IMPLANT
SUT VIC AB 3-0 SH 27 (SUTURE) ×1
SUT VIC AB 3-0 SH 27X BRD (SUTURE) IMPLANT
SUT VIC AB 4-0 PS2 18 (SUTURE) ×4 IMPLANT
SUT VICRYL 0 UR6 27IN ABS (SUTURE) IMPLANT
SUT VLOC 180 0 9IN  GS21 (SUTURE)
SUT VLOC 180 0 9IN GS21 (SUTURE) IMPLANT
SYR 10ML LL (SYRINGE) IMPLANT
SYS BAG RETRIEVAL 10MM (BASKET) ×1
SYS WOUND ALEXIS 18CM MED (MISCELLANEOUS)
SYSTEM BAG RETRIEVAL 10MM (BASKET) IMPLANT
SYSTEM WOUND ALEXIS 18CM MED (MISCELLANEOUS) IMPLANT
TOWEL OR NON WOVEN STRL DISP B (DISPOSABLE) IMPLANT
TRAP SPECIMEN MUCUS 40CC (MISCELLANEOUS) IMPLANT
TRAY FOLEY MTR SLVR 16FR STAT (SET/KITS/TRAYS/PACK) ×2 IMPLANT
TROCAR PORT AIRSEAL 5X120 (TROCAR) IMPLANT
UNDERPAD 30X36 HEAVY ABSORB (UNDERPADS AND DIAPERS) ×4 IMPLANT
WATER STERILE IRR 1000ML POUR (IV SOLUTION) ×2 IMPLANT
YANKAUER SUCT BULB TIP 10FT TU (MISCELLANEOUS) IMPLANT

## 2022-02-10 NOTE — Discharge Instructions (Signed)
AFTER SURGERY INSTRUCTIONS   Return to work: 4-6 weeks if applicable  We have sent in oxycodone for pain if needed at home. Do not take with tramadol.   Activity: 1. Be up and out of the bed during the day.  Take a nap if needed.  You may walk up steps but be careful and use the hand rail.  Stair climbing will tire you more than you think, you may need to stop part way and rest.    2. No lifting or straining for 6 weeks over 10 pounds. No pushing, pulling, straining for 6 weeks.   3. No driving for around 1 week(s).  Do not drive if you are taking narcotic pain medicine and make sure that your reaction time has returned.    4. You can shower as soon as the next day after surgery. Shower daily.  Use your regular soap and water (not directly on the incision) and pat your incision(s) dry afterwards; don't rub.  No tub baths or submerging your body in water until cleared by your surgeon. If you have the soap that was given to you by pre-surgical testing that was used before surgery, you do not need to use it afterwards because this can irritate your incisions.    5. No sexual activity and nothing in the vagina for 12 weeks.   6. You may experience a small amount of clear drainage from your incisions, which is normal.  If the drainage persists, increases, or changes color please call the office.   7. Do not use creams, lotions, or ointments such as neosporin on your incisions after surgery until advised by your surgeon because they can cause removal of the dermabond glue on your incisions.     8. You may experience vaginal spotting after surgery or around the 6-8 week mark from surgery when the stitches at the top of the vagina begin to dissolve.  The spotting is normal but if you experience heavy bleeding, call our office.   9. Take Tylenol or ibuprofen first for pain if you are able to take these medications and only use narcotic pain medication for severe pain not relieved by the Tylenol or  Ibuprofen.  Monitor your Tylenol intake to a max of 4,000 mg in a 24 hour period. You can alternate these medications after surgery.   Diet: 1. Low sodium Heart Healthy Diet is recommended but you are cleared to resume your normal (before surgery) diet after your procedure.   2. It is safe to use a laxative, such as Miralax or Colace, if you have difficulty moving your bowels. You have been prescribed Sennakot-S to take at bedtime every evening after surgery to keep bowel movements regular and to prevent constipation.     Wound Care: 1. Keep clean and dry.  Shower daily.   Reasons to call the Doctor: Fever - Oral temperature greater than 100.4 degrees Fahrenheit Foul-smelling vaginal discharge Difficulty urinating Nausea and vomiting Increased pain at the site of the incision that is unrelieved with pain medicine. Difficulty breathing with or without chest pain New calf pain especially if only on one side Sudden, continuing increased vaginal bleeding with or without clots.   Contacts: For questions or concerns you should contact:   Dr. Clide Cliff at [phone]   Warner Mccreedy, NP at [phone]   After Hours: call [phone] and have the GYN Oncologist paged/contacted (after 5 pm or on the weekends).   Messages sent via mychart are for non-urgent matters  and are not responded to after hours so for urgent needs, please call the after hours number.

## 2022-02-10 NOTE — Transfer of Care (Signed)
Immediate Anesthesia Transfer of Care Note  Patient: Maureen Campos  Procedure(s) Performed: XI ROBOTIC ASSISTED TOTAL HYSTERECTOMY WITH BILATERAL SALPINGO OOPHORECTOMY XI ROBOTIC PELVIC AND PARA-AORTIC SENTINEL LYMPH NODE DISSECTION  Patient Location: PACU  Anesthesia Type:General  Level of Consciousness: drowsy  Airway & Oxygen Therapy: Patient Spontanous Breathing and Patient connected to face mask oxygen  Post-op Assessment: Report given to RN and Post -op Vital signs reviewed and stable  Post vital signs: Reviewed and stable  Last Vitals:  Vitals Value Taken Time  BP 166/96 02/10/22 1423  Temp    Pulse 94 02/10/22 1426  Resp 22 02/10/22 1426  SpO2 100 % 02/10/22 1426  Vitals shown include unvalidated device data.  Last Pain:  Vitals:   02/10/22 0627  TempSrc:   PainSc: 3       Patients Stated Pain Goal: 5 (50/15/86 8257)  Complications: No notable events documented.

## 2022-02-10 NOTE — Anesthesia Procedure Notes (Signed)
Procedure Name: Intubation Date/Time: 02/10/2022 8:08 AM  Performed by: Sharlette Dense, CRNAPre-anesthesia Checklist: Patient identified, Emergency Drugs available, Suction available and Patient being monitored Patient Re-evaluated:Patient Re-evaluated prior to induction Oxygen Delivery Method: Circle system utilized Preoxygenation: Pre-oxygenation with 100% oxygen Induction Type: IV induction Ventilation: Mask ventilation without difficulty Laryngoscope Size: Miller and 2 Grade View: Grade I Tube type: Oral Tube size: 7.5 mm Number of attempts: 1 Airway Equipment and Method: Stylet Placement Confirmation: ETT inserted through vocal cords under direct vision, positive ETCO2 and breath sounds checked- equal and bilateral Secured at: 22 cm Tube secured with: Tape Dental Injury: Teeth and Oropharynx as per pre-operative assessment

## 2022-02-10 NOTE — Anesthesia Postprocedure Evaluation (Signed)
Anesthesia Post Note  Patient: Lorenza Cambridge  Procedure(s) Performed: XI ROBOTIC ASSISTED TOTAL HYSTERECTOMY WITH BILATERAL SALPINGO OOPHORECTOMY XI ROBOTIC PELVIC AND PARA-AORTIC SENTINEL LYMPH NODE DISSECTION     Patient location during evaluation: PACU Anesthesia Type: General Level of consciousness: awake and alert, patient cooperative and oriented Pain management: pain level controlled Vital Signs Assessment: post-procedure vital signs reviewed and stable Respiratory status: spontaneous breathing, nonlabored ventilation, respiratory function stable and patient connected to nasal cannula oxygen Cardiovascular status: blood pressure returned to baseline and stable Postop Assessment: no apparent nausea or vomiting Anesthetic complications: no   No notable events documented.  Last Vitals:  Vitals:   02/10/22 1515 02/10/22 1530  BP: (!) 170/103 127/74  Pulse: 89 100  Resp: 16 19  Temp:    SpO2: 96% 95%    Last Pain:  Vitals:   02/10/22 1530  TempSrc:   PainSc: Asleep                 Lonie Rummell,E. Fabion Gatson

## 2022-02-10 NOTE — Brief Op Note (Signed)
02/10/2022  2:18 PM  PATIENT:  Maureen Campos  52 y.o. female  PRE-OPERATIVE DIAGNOSIS:  ENDOMETRIAL CANCER  POST-OPERATIVE DIAGNOSIS:  ENDOMETRIAL CANCER  PROCEDURE:  Procedure(s): XI ROBOTIC ASSISTED TOTAL HYSTERECTOMY WITH BILATERAL SALPINGO OOPHORECTOMY (N/A) XI ROBOTIC PELVIC AND PARA-AORTIC SENTINEL LYMPH NODE DISSECTION (N/A)  SURGEON:  Surgeon(s) and Role:    Bernadene Bell, MD - Primary    * Lahoma Crocker, MD - Assisting  PHYSICIAN ASSISTANT:   ASSISTANTS: Lahoma Crocker, MD   ANESTHESIA:   general  EBL:  250 mL   BLOOD ADMINISTERED: 1u pRBC  DRAINS: none   LOCAL MEDICATIONS USED:  MARCAINE     SPECIMEN:   ID Type Source Tests Collected by Time Destination  1 : Right Aorta Caval Lymph Nodes Tissue PATH Sentinel Lymph Node SURGICAL PATHOLOGY Bernadene Bell, MD 02/10/2022 1010   2 : Right Pelvic Lymph Nodes Tissue PATH Sentinel Lymph Node SURGICAL PATHOLOGY Bernadene Bell, MD 02/10/2022 1051   3 : Left Obturator pelvic lymph node Tissue PATH Sentinel Lymph Node SURGICAL PATHOLOGY Bernadene Bell, MD 02/10/2022 1120   4 : Uterus, cervix, bilateral tubes and ovaries Tissue PATH Gyn tumor resection SURGICAL PATHOLOGY Bernadene Bell, MD 02/10/2022 1254   5 : Right Posterior Cervical Margin Tissue PATH Gyn tumor resection SURGICAL PATHOLOGY Bernadene Bell, MD 02/10/2022 1301     DISPOSITION OF SPECIMEN:  PATHOLOGY  COUNTS:  YES  TOURNIQUET:  * No tourniquets in log *  DICTATION: .Note written in EPIC  PLAN OF CARE: Admit for overnight observation  PATIENT DISPOSITION:  PACU - hemodynamically stable.   Delay start of Pharmacological VTE agent (>24hrs) due to surgical blood loss or risk of bleeding: not applicable

## 2022-02-10 NOTE — Op Note (Signed)
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 Obesity made retroperitoneal visualization limited, increasing the complexity of the case and necessitating additional instrumentation for retraction and to create safe exposure. Obesity related complexity increased the duration of the procedure by 60 minutes.)  Surgeon: Bernadene Bell, MD  Assistants: Lahoma Crocker, MD and (an MD assistant was necessary for tissue manipulation, management of robotic instrumentation, retraction and positioning due to the complexity of the case and hospital policies)  Anesthesia: General  Estimated Blood Loss: 250 mL   Fluids: 3050 ml, crystalloid  Urine Output: 250 ml, clear yellow  Findings: Normal upper abdominal survey with normal liver surface and diaphragm. Normal appearing small and large bowel. Globally enlarged uterus. Overall normal tubes, and ovaries, small paratubal cyst. No evidence of peritoneal disease, ascites, or carcinomatosis. Enlarged bilateral pelvic and right para-aortic lymph nodes removed. Tumor spill into the vagina and pelvis noted with colpotomy. Pelvis and vagina copiously irrigated. Significant intra-abdominal adiposity.   Specimens:  ID Type Source Tests Collected by Time Destination  1 : Right Aorta Caval Lymph Nodes Tissue PATH Sentinel Lymph Node SURGICAL PATHOLOGY Bernadene Bell, MD 02/10/2022 1010   2 : Right Pelvic Lymph Nodes Tissue PATH Sentinel Lymph  Node SURGICAL PATHOLOGY Bernadene Bell, MD 02/10/2022 1051   3 : Left Obturator pelvic lymph node Tissue PATH Sentinel Lymph Node SURGICAL PATHOLOGY Bernadene Bell, MD 02/10/2022 1120   4 : Uterus, cervix, bilateral tubes and ovaries Tissue PATH Gyn tumor resection SURGICAL PATHOLOGY Bernadene Bell, MD 02/10/2022 1254   5 : Right Posterior Cervical Margin Tissue PATH Gyn tumor resection SURGICAL PATHOLOGY Bernadene Bell, MD 08/09/4625 0350     Complications:  None  Indications for Procedure: Maureen Campos is a 52 y.o. woman with FIGO grade 2 endometrioid endometrial cancer and imaging concerning for enlarged, PET avid pelvic and para-aortic lymph nodes.  Prior to the procedure, all risks, benefits, and alternatives were discussed and informed surgical consent was signed.  Procedure: Patient was taken to the operating room where general anesthesia was achieved.  She was positioned in dorsal lithotomy and prepped and draped.  A foley catheter was inserted into the bladder. 1 ml of dilute Indo-Cyanine dye was was injected at 1cm and 39m deep at 3 and 9 o'clock in the cervical stroma.  The cervix was dilated and an Advincula uterine manipulator with a colpotomy ring was inserted into the uterus.  A 12 mm incision was made in the left upper quadrant near Palmer's point.  The abdomen was entered with a 5 mm OptiView trocar under direct visualization.  The abdomen was insufflated, the patient placed in steep Trendelenburg, and additional trocars were placed as follows: an 843mtrocar superior to the umbilicus, two 8 mm robotic trocars in the right abdomen, and one 8 mm robotic trocar in the left abdomen.  The left upper quadrant trocar was removed and replaced with a 12 mm airseal trocar.  All trocars were placed under direct visualization.  The bowels were moved into the upper abdomen.  The DaVinci robotic surgical system was brought to the patient's bedside and docked.  The peritoneum overlying the right  common iliac vessels was opened and extended cephalad following the aorta.  The right ureter was identified and retracted up toward the anterior abdominal wall along with the right ascending colon.  An enlarged ICG green right para-aortic lymph node was noted at the level corresponding to the PET avid lymph node. Using blunt and sharp dissection and cautery, the lymph node was isolated and removed. A perforating vessel from the IVC was isolated, cauterized and transected. This specimen was removed through the assistant trocar. Flo seal was applied and hemostasis was noted in this lymph node bed.   The right round ligament was transected and the retroperitoneum entered.The right ureter was identified. The paravesical and pararectal spaces were opened, and the ICG green and pathologically enlarged right pelvic lymph node was found to be located in the obturator space. The lymph node dissection was performed taking care to avoid injury to the ureter, superior vesicle artery or obturator nerve. The lymph node was placed into an endo-catch bag for removal at the end of the case. A similar procedure was performed on left with the ICG green and pathologically enlarged pelvic lymph node identified in the left obturator space. The lymph node was removed through the assistant trocar.   The left ureter was again identified, and the left infundibulopelvic ligament was isolated, cauterized, and transected. The posterior peritoneum was opened to the colpotomy ring. The anterior peritoneum was opened and the bladder flap was created. The left uterine artery was skeletonized, cauterized, and transected at the level of the colpotomy ring. Additional cautery was used in a C-shaped fashion to allow the remainder of the broad, cardinal, and uterosacral ligaments with the uterine vessels to be transected and fall away from the colpotomy ring.  A similar procedure was performed on the contralateral side. At this time, it was noted  that the colpotomy ring was difficulty to distinguish, likely due to expansion of the lower uterine segment from tumor. The uterine manipulator was removed and an EEA sizer was placed into the anterior vaginal fornix. The anterior colpotomy was made and continued circumferentially following the contours of the cervix as visualized from within the vagina.  The uterine specimen was placed in an endocatch bag and removed through the vagina.   The vaginal cuff was closed with a running stitch of 0 Vicryl suture.  The pelvis was copiously irrigated. Flo seal was placed in the bilateral pelvic node dissection beds and on the cuff, and all operative sites were found to be hemostatic.  All instruments were removed and the robot was taken from the patient's bedside. The fascia at the 12 mm incision was closed with 0 Vicryl using a PMI device. The abdomen was desufflated and all ports were removed. The skin at all incisions was closed with 4-0 Vicryl to reapproximate the subcutaneous tissue and 4-0 monocryl in a subcuticular fashion followed by surgical glue. The vagina was copiously irrigated.  Patient tolerated the procedure well. Sponge, lap, and instrument counts were correct.  Patient received 2 gm of Ancef prior to skin incision for routine perioperative antibiotic prophylaxis. This was re-dosed during the procedure given procedure duration.  She was extubated and taken to the PACU in stable condition.  Bernadene Bell, MD Gynecologic Oncology

## 2022-02-10 NOTE — Interval H&P Note (Signed)
History and Physical Interval Note:  02/10/2022 7:53 AM  Maureen Campos  has presented today for surgery, with the diagnosis of ENDOMETRIAL CANCER.  The various methods of treatment have been discussed with the patient and family. After consideration of risks, benefits and other options for treatment, the patient has consented to  Procedure(s): XI ROBOTIC ASSISTED TOTAL HYSTERECTOMY WITH BILATERAL SALPINGO OOPHORECTOMY (N/A) XI ROBOTIC PELVIC AND PARA-AORTIC LYMPH NODE DISSECTION, POSSIBLE LAPAROTOMY (N/A) as a surgical intervention.  The patient's history has been reviewed, patient examined, no change in status, stable for surgery.  I have reviewed the patient's chart and labs.  Questions were answered to the patient's satisfaction.     Jex Strausbaugh

## 2022-02-11 ENCOUNTER — Encounter (HOSPITAL_COMMUNITY): Payer: Self-pay | Admitting: Psychiatry

## 2022-02-11 LAB — CBC
HCT: 25.7 % — ABNORMAL LOW (ref 36.0–46.0)
Hemoglobin: 7.3 g/dL — ABNORMAL LOW (ref 12.0–15.0)
MCH: 19.7 pg — ABNORMAL LOW (ref 26.0–34.0)
MCHC: 28.4 g/dL — ABNORMAL LOW (ref 30.0–36.0)
MCV: 69.5 fL — ABNORMAL LOW (ref 80.0–100.0)
Platelets: 629 10*3/uL — ABNORMAL HIGH (ref 150–400)
RBC: 3.7 MIL/uL — ABNORMAL LOW (ref 3.87–5.11)
RDW: 19.1 % — ABNORMAL HIGH (ref 11.5–15.5)
WBC: 14.1 10*3/uL — ABNORMAL HIGH (ref 4.0–10.5)
nRBC: 0.1 % (ref 0.0–0.2)

## 2022-02-11 LAB — BASIC METABOLIC PANEL
Anion gap: 7 (ref 5–15)
BUN: 7 mg/dL (ref 6–20)
CO2: 24 mmol/L (ref 22–32)
Calcium: 7.9 mg/dL — ABNORMAL LOW (ref 8.9–10.3)
Chloride: 101 mmol/L (ref 98–111)
Creatinine, Ser: 0.48 mg/dL (ref 0.44–1.00)
GFR, Estimated: >60 mL/min (ref >60)
Glucose, Bld: 168 mg/dL — ABNORMAL HIGH (ref 70–99)
Potassium: 4.1 mmol/L (ref 3.5–5.1)
Sodium: 132 mmol/L — ABNORMAL LOW (ref 135–145)

## 2022-02-11 LAB — HEMOGLOBIN AND HEMATOCRIT, BLOOD
HCT: 25.6 % — ABNORMAL LOW (ref 36.0–46.0)
HCT: 27.6 % — ABNORMAL LOW (ref 36.0–46.0)
Hemoglobin: 7.1 g/dL — ABNORMAL LOW (ref 12.0–15.0)
Hemoglobin: 7.8 g/dL — ABNORMAL LOW (ref 12.0–15.0)

## 2022-02-11 MED ORDER — OXYCODONE HCL 5 MG PO TABS: 5.0000 mg | ORAL_TABLET | ORAL | 0 refills | Status: DC | PRN

## 2022-02-11 MED ORDER — OXYCODONE HCL 5 MG PO TABS
5.0000 mg | ORAL_TABLET | ORAL | Status: DC | PRN
  Administered 2022-02-11 – 2022-02-12 (×3): 10 mg via ORAL
  Filled 2022-02-11 (×3): qty 2

## 2022-02-11 MED ORDER — SODIUM CHLORIDE 0.9% IV SOLUTION: Freq: Once | INTRAVENOUS | Status: AC

## 2022-02-11 NOTE — Discharge Summary (Signed)
Physician Discharge Summary  Patient ID: Maureen Campos MRN: [id number] DOB/AGE: [birth year] 52 y.o.  Admit date: 02/10/2022 Discharge date: 02/12/2022  Admission Diagnoses: Endometrial cancer [facility])  Discharge Diagnoses:  Principal Problem:   Endometrial cancer [facility])   Discharged Condition:  The patient is in good condition and stable for discharge.    Hospital Course: On 02/10/2022, the patient underwent the following: Procedure(s): XI ROBOTIC ASSISTED TOTAL HYSTERECTOMY WITH BILATERAL SALPINGO OOPHORECTOMY XI ROBOTIC PELVIC AND PARA-AORTIC SENTINEL LYMPH NODE DISSECTION. The postoperative course included post-operative dizziness, abdominal pain. She was transfused 1 unit PRBCs on 02/11/22 for symptomatic anemia with Hgb at 7.1.  She was discharged to home on postoperative day 2 tolerating a regular diet, passing flatus, ambulating, dizziness resolved, pain controlled with oral medications.   Consults: None  Significant Diagnostic Studies: Labs  Treatments: Surgery: see above. Blood transfusion of 1 unit PRBCs.  Discharge Exam: Blood pressure 134/79, pulse 80, temperature 98.1 F (36.7 C), temperature source Oral, resp. rate 18, height '5\' 1"'$  (1.549 m), weight 233 lb 12.8 oz (106.1 kg), last menstrual period 01/06/2022, SpO2 97 %. General: alert, cooperative, and no distress Resp: clear to auscultation bilaterally Cardio: regular rate and rhythm, S1, S2 normal, no murmur, click, rub or gallop GI: incision: lap sites to the abdomen with dermabond intact with no drainage and abdomen soft, obese, active bowel sounds, moderate tenderness noted on left abdomen with this area being soft. Extremities: extremities normal, atraumatic, no cyanosis or edema  Disposition: Discharge disposition: 01-Home or Self Care       Discharge Instructions     Call MD for:  difficulty breathing, headache or visual disturbances   Complete by: As directed    Call MD for:  extreme fatigue   Complete  by: As directed    Call MD for:  hives   Complete by: As directed    Call MD for:  persistant dizziness or light-headedness   Complete by: As directed    Call MD for:  persistant nausea and vomiting   Complete by: As directed    Call MD for:  redness, tenderness, or signs of infection (pain, swelling, redness, odor or green/yellow discharge around incision site)   Complete by: As directed    Call MD for:  severe uncontrolled pain   Complete by: As directed    Call MD for:  temperature >100.4   Complete by: As directed    Diet - low sodium heart healthy   Complete by: As directed    Driving Restrictions   Complete by: As directed    No driving for around 1 week(s).  Do not take narcotics and drive. You need to make sure your reaction time has returned.   Increase activity slowly   Complete by: As directed    Lifting restrictions   Complete by: As directed    No lifting greater than 10 lbs, pushing, pulling, straining for 6 weeks.   Sexual Activity Restrictions   Complete by: As directed    No sexual activity, nothing in the vagina, for 12 weeks.      Allergies as of 02/12/2022   No Known Allergies      Medication List     STOP taking these medications    traMADol 50 MG tablet Commonly known as: ULTRAM       TAKE these medications    ferrous sulfate 325 (65 FE) MG EC tablet Take 1 tablet (325 mg total) by mouth in the morning and at bedtime.  ibuprofen 800 MG tablet Commonly known as: ADVIL Take 1 tablet (800 mg total) by mouth every 8 (eight) hours as needed for moderate pain. For AFTER surgery only What changed:  medication strength how much to take when to take this additional instructions   oxyCODONE 5 MG immediate release tablet Commonly known as: Oxy IR/ROXICODONE Take 1 tablet (5 mg total) by mouth every 4 (four) hours as needed for severe pain. For AFTER surgery, do not take and drive, do not take with tramadol   senna-docusate 8.6-50 MG  tablet Commonly known as: Senokot-S Take 2 tablets by mouth at bedtime. For AFTER surgery, do not take if having diarrhea        Follow-up Information     Bernadene Bell, MD Follow up on 02/23/2022.   Specialty: Gynecologic Oncology Why: at 1:30pm at the [facility] for post-op check Contact information: [address] [place] [address] [phone]                 Greater than thirty minutes were spend for face to face discharge instructions and discharge orders/summary in EPIC.   Signed: Dorothyann Gibbs 02/12/2022, 8:52 AM

## 2022-02-11 NOTE — Progress Notes (Signed)
1 Day Post-Op Procedure(s) (LRB): XI ROBOTIC ASSISTED TOTAL HYSTERECTOMY WITH BILATERAL SALPINGO OOPHORECTOMY (N/A) XI ROBOTIC PELVIC AND PARA-AORTIC SENTINEL LYMPH NODE DISSECTION (N/A)  Subjective: Patient reports abdominal soreness that is relieved with prn medications. Pain level at 7 intermittently. Does not feel the tramadol works for pain. No nausea or emesis. Voiding without difficulty. Belching intermittently. No flatus. Increase in abdominal pain with ambulation but feels steady. Dizziness intermittently with walking.  Plan to recheck Hgb level. Send in oxy. Cancel tramadol with pharmacy.  Objective: Vital signs in last 24 hours: Temp:  [97.8 F (36.6 C)-98.7 F (37.1 C)] 98.1 F (36.7 C) (02/07 0456) Pulse Rate:  [78-108] 78 (02/07 0456) Resp:  [13-21] 18 (02/07 0456) BP: (117-174)/(68-103) 117/68 (02/07 0456) SpO2:  [90 %-100 %] 97 % (02/07 0456)    Intake/Output from previous day: 02/06 0701 - 02/07 0700 In: 5592.5 [P.O.:660; I.V.:4416.5; Blood:316; IV Piggyback:200] Out: 2600 [Urine:2350; Blood:250]  Physical Examination: General: alert, cooperative, and no distress Resp: clear to auscultation bilaterally Cardio: regular rate and rhythm, S1, S2 normal, no murmur, click, rub or gallop GI: incision: lap sites to the abdomen with dermabond intact with no drainage and abdomen soft, obese, mildly hypoactive, moderate tenderness noted on left abdomen with this area being soft. Extremities: extremities normal, atraumatic, no cyanosis or edema  Labs: WBC/Hgb/Hct/Plts:  14.1/7.3/25.7/629 (02/07 3419) BUN/Cr/glu/ALT/AST/amyl/lip:  7/0.48/--/--/--/--/-- (02/07 3790)  Assessment: 52 y.o. s/p Procedure(s): XI ROBOTIC ASSISTED TOTAL HYSTERECTOMY WITH BILATERAL SALPINGO OOPHORECTOMY XI ROBOTIC PELVIC AND PARA-AORTIC SENTINEL LYMPH NODE DISSECTION: stable Pain:  Pain is well-controlled on PRN medications.  Heme: Hgb 7.3 and Hct 25.7 this am. Dizziness with ambulation. Plan for  repeat H&H to assess stability.  ID: WBC 14.1 this am. Given decadron intra-op. No evidence of infection.  CV: BP and HR overall stable. Continue to monitor while inpatient.  GI:  Tolerating po:Yes. Antiemetics ordered if needed.  GU: Voiding adequate amounts. Creatinine 0.48 this am.    FEN: No critical values this am.  Prophylaxis: SCDs on. Lovenox ordered for later in the am.  Plan: Plan for repeat H&H this am to assess for stability IV to saline lock Blood transfusion may be needed since symptomatic, awaiting H&H If improvement in dizziness with ambulation, H&H stable, plan for discharge.  Plan for discharge to home when ready   LOS: 0 days    Maureen Campos 02/11/2022, 7:44 AM

## 2022-02-11 NOTE — Progress Notes (Signed)
Gynecologic Oncology Progress Note  Patient alert, oriented, ambulating without assist in the room when entering. States the dizziness when out of bed is getting better but not resolved. Tolerating diet with no nausea or emesis. Reporting abdominal pain with the need for oxycodone every couple of hours. The oxycodone is working to relief her pain. No flatus or BM reported. She states she would like to stay overnight given the dizziness, abdominal pain, and no return of flatus or BM. No other concerns voiced. Patient consented to blood transfusion given dizziness symptoms this am.  Exam: Alert, oriented, in no acute distress. Lungs clear. Heart regular in rate and rhythm, non-tachycardic. Abdomen soft, tender on palpation, active bowel sounds. No lower extrem edema noted.   Given persistent dizziness, need for systemic chemotherapy in the near future, anemia with Hgb at 7.1 and Hct 25.6, plan for transfusion of 1 unit RBCs. Advised patient we will see how she feels at the completion. Plan for discharge later today or in the am based on pt's status later today.

## 2022-02-11 NOTE — Progress Notes (Signed)
Gynecologic Oncology Progress Note  Received update from patient's RN. Patient having moderate to severe abdominal pain. Pt took oxycodone with little relief and was given IV dilaudid which assisted with pain relief. Vitals normal per RN except for mild tachycardia when in pain which resolved after medication. Given increase in pain, persistent dizziness, plan for hold on discharge at this time and monitoring overnight. She is receiving transfusion of 1 unit RBCs. Plan for repeat labs in the am. Oxycodone dosing adjusted. Status change to admit.

## 2022-02-12 LAB — CBC
HCT: 28.3 % — ABNORMAL LOW (ref 36.0–46.0)
Hemoglobin: 8.1 g/dL — ABNORMAL LOW (ref 12.0–15.0)
MCH: 20.9 pg — ABNORMAL LOW (ref 26.0–34.0)
MCHC: 28.6 g/dL — ABNORMAL LOW (ref 30.0–36.0)
MCV: 72.9 fL — ABNORMAL LOW (ref 80.0–100.0)
Platelets: 426 10*3/uL — ABNORMAL HIGH (ref 150–400)
RBC: 3.88 MIL/uL (ref 3.87–5.11)
RDW: 21.4 % — ABNORMAL HIGH (ref 11.5–15.5)
WBC: 9.9 10*3/uL (ref 4.0–10.5)
nRBC: 0 % (ref 0.0–0.2)

## 2022-02-12 LAB — BASIC METABOLIC PANEL
Anion gap: 11 (ref 5–15)
BUN: 12 mg/dL (ref 6–20)
CO2: 25 mmol/L (ref 22–32)
Calcium: 8 mg/dL — ABNORMAL LOW (ref 8.9–10.3)
Chloride: 101 mmol/L (ref 98–111)
Creatinine, Ser: 0.45 mg/dL (ref 0.44–1.00)
GFR, Estimated: >60 mL/min (ref >60)
Glucose, Bld: 134 mg/dL — ABNORMAL HIGH (ref 70–99)
Potassium: 4.3 mmol/L (ref 3.5–5.1)
Sodium: 137 mmol/L (ref 135–145)

## 2022-02-12 NOTE — Progress Notes (Signed)
2 Days Post-Op Procedure(s) (LRB): XI ROBOTIC ASSISTED TOTAL HYSTERECTOMY WITH BILATERAL SALPINGO OOPHORECTOMY (N/A) XI ROBOTIC PELVIC AND PARA-AORTIC SENTINEL LYMPH NODE DISSECTION (N/A)  Subjective: Patient reports dizziness resolved. More pain on left side of abdomen with tenderness with LUQ incision. Passing flatus. No BM. Tolerating diet. Feels steady when out of bed. Denies chest pain, dyspnea. Voiding without difficulty.  Objective: Vital signs in last 24 hours: Temp:  [97.8 F (36.6 C)-98.2 F (36.8 C)] 98.1 F (36.7 C) (02/08 0550) Pulse Rate:  [76-91] 80 (02/08 0550) Resp:  [18] 18 (02/08 0550) BP: (118-138)/(66-79) 134/79 (02/08 0550) SpO2:  [97 %-98 %] 97 % (02/08 0550)    Intake/Output from previous day: 02/07 0701 - 02/08 0700 In: 960 [P.O.:600; Blood:360] Out: 825 [Urine:825]  Physical Examination: General: alert, cooperative, and no distress Resp: clear to auscultation bilaterally Cardio: regular rate and rhythm, S1, S2 normal, no murmur, click, rub or gallop GI: incision: lap sites to the abdomen with dermabond intact with no drainage and abdomen soft, obese, active bowel sounds, moderate tenderness noted on left abdomen with this area being soft. Extremities: extremities normal, atraumatic, no cyanosis or edema  Labs: WBC/Hgb/Hct/Plts:  9.9/8.1/28.3/426 (02/08 0414) BUN/Cr/glu/ALT/AST/amyl/lip:  12/0.45/--/--/--/--/-- (02/08 0414)  Assessment: 52 y.o. s/p Procedure(s): XI ROBOTIC ASSISTED TOTAL HYSTERECTOMY WITH BILATERAL SALPINGO OOPHORECTOMY XI ROBOTIC PELVIC AND PARA-AORTIC SENTINEL LYMPH NODE DISSECTION: stable Pain:  Pain is well-controlled on PRN medications.  Heme: Hgb 8.1 and Hct 28.3 this am. S/P transfusion of 1 unit PRBCs on 02/11/22. Dizziness with ambulation on POD 1.   ID: WBC 9.9 this am. Given decadron intra-op. No evidence of infection.  CV: BP and HR overall stable. Continue to monitor while inpatient.  GI:  Tolerating po:Yes.  Antiemetics ordered if needed.  GU: Voiding adequate amounts. Creatinine 0.45 this am.    FEN: No critical values this am.  Prophylaxis: SCDs on. Lovenox ordered for later in the am.  Plan: Plan for discharge to home this am   LOS: 1 day    Maureen Campos 02/12/2022, 7:42 AM

## 2022-02-13 ENCOUNTER — Telehealth: Payer: Self-pay

## 2022-02-13 NOTE — Telephone Encounter (Signed)
Spoke with Ms. Leibold this morning. She states she is eating, drinking and urinating well. She has not had a BM yet but is passing gas. She is taking senokot as prescribed and encouraged her to drink plenty of water. She denies fever or chills. Incisions are dry and intact. She rates her pain 4/10. Her pain is controlled with Ibuprofen/Oxycodone. Pt aware she can alternate Tylenol/Ibuprofen and use Oxycodone only for severe pain that Tylenol/Ibuprofen isn't getting rid of. She voiced an understanding    Instructed to call office with any fever, chills, purulent drainage, uncontrolled pain or any other questions or concerns. Patient verbalizes understanding.   Pt aware of post op appointments as well as the office number [phone] and after hours number [phone] to call if she has any questions or concerns

## 2022-02-14 LAB — TYPE AND SCREEN
ABO/RH(D): A POS
Antibody Screen: NEGATIVE
Unit division: 0
Unit division: 0
Unit division: 0
Unit division: 0

## 2022-02-14 LAB — BPAM RBC
Blood Product Expiration Date: 202402282359
Blood Product Expiration Date: 202402282359
Blood Product Expiration Date: 202403022359
Blood Product Expiration Date: 202403022359
ISSUE DATE / TIME: 202402060839
ISSUE DATE / TIME: 202402071657
Unit Type and Rh: 6200
Unit Type and Rh: 6200
Unit Type and Rh: 6200
Unit Type and Rh: 6200

## 2022-02-16 ENCOUNTER — Other Ambulatory Visit: Payer: Self-pay | Admitting: Oncology

## 2022-02-16 ENCOUNTER — Telehealth: Payer: Self-pay | Admitting: Psychiatry

## 2022-02-16 NOTE — Telephone Encounter (Signed)
Attempted to call patient twice to review pathology results and wanted to go ahead and arrange follow-up with medical oncology.  No answer.  Generic voicemail left.  Will have our team try again tomorrow.

## 2022-02-16 NOTE — Progress Notes (Signed)
Gynecologic Oncology Multi-Disciplinary Disposition Conference Note  Date of the Conference: 02/16/2022  Patient Name: Maureen Campos  Referring Provider: Dr. Ronnald Nian Primary GYN Oncologist: Dr. 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 Disposition is to chemotherapy with carboplatin/Taxol followed by repeat imaging and consideration for external beam radiation and vaginal brachytherapy.   This Multidisciplinary conference took place involving physicians from [place], Medical Oncology, Radiation Oncology, Pathology, Radiology along with the Gynecologic Oncology Nurse Practitioner and Gynecologic Oncology Nurse Navigator.  Comprehensive assessment of the patient's malignancy, staging, need for surgery, chemotherapy, radiation therapy, and need for further testing were reviewed. Supportive measures, both inpatient and following discharge were also discussed. The recommended plan of care is documented. Greater than 35 minutes were spent correlating and coordinating this patient's care.

## 2022-02-17 ENCOUNTER — Telehealth: Payer: Self-pay | Admitting: Oncology

## 2022-02-17 LAB — SURGICAL PATHOLOGY

## 2022-02-17 NOTE — Telephone Encounter (Signed)
Maureen Campos called back and scheduled new patient appointment for her mom with Dr. Alvy Bimler on 03/02/22 at 2:00 with 1:30 arrival.  She verbalized understanding and agreement of the appointment date and time.

## 2022-02-17 NOTE — Telephone Encounter (Signed)
Left message with [name] (daughter) to schedule an appointment with Dr. Alvy Bimler.  Requested a return call.

## 2022-02-23 ENCOUNTER — Other Ambulatory Visit: Payer: Self-pay

## 2022-02-23 ENCOUNTER — Encounter: Payer: Self-pay | Admitting: Psychiatry

## 2022-02-23 ENCOUNTER — Inpatient Hospital Stay: Payer: Self-pay | Attending: Psychiatry | Admitting: Psychiatry

## 2022-02-23 VITALS — BP 145/76 | HR 92 | Temp 98.4°F | Resp 16 | Ht 61.0 in | Wt 224.0 lb

## 2022-02-23 DIAGNOSIS — Z90722 Acquired absence of ovaries, bilateral: Secondary | ICD-10-CM | POA: Insufficient documentation

## 2022-02-23 DIAGNOSIS — E669 Obesity, unspecified: Secondary | ICD-10-CM | POA: Insufficient documentation

## 2022-02-23 DIAGNOSIS — D509 Iron deficiency anemia, unspecified: Secondary | ICD-10-CM | POA: Insufficient documentation

## 2022-02-23 DIAGNOSIS — Z9079 Acquired absence of other genital organ(s): Secondary | ICD-10-CM | POA: Insufficient documentation

## 2022-02-23 DIAGNOSIS — Z79899 Other long term (current) drug therapy: Secondary | ICD-10-CM | POA: Insufficient documentation

## 2022-02-23 DIAGNOSIS — Z90710 Acquired absence of both cervix and uterus: Secondary | ICD-10-CM | POA: Insufficient documentation

## 2022-02-23 DIAGNOSIS — C541 Malignant neoplasm of endometrium: Secondary | ICD-10-CM | POA: Insufficient documentation

## 2022-02-23 NOTE — Progress Notes (Signed)
Gynecologic Oncology Return Clinic Visit  Date of Service: 02/23/2022 Referring Provider:  Lennice Sites, DO   Assessment & Plan: Maureen Campos is a 52 y.o. 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  Postop: - Pt recovering well from surgery and healing appropriately postoperatively - Intraoperative findings and pathology results reviewed. - Ongoing postoperative expectations and precautions reviewed. Continue with no lifting >10lbs through 6 weeks postoperatively - Nothing in the vagina for 8 weeks.   Endometrial cancer: - Pathology results reviewed in detail - Given pt's high grade lesion that involves at least her pelvic lymph nodes, recommend adjuvant treatment with systemic treatment with carboplatin/paclitaxel. Given her MMRd status, would recommend addition of immunotherapy. - General side effects and expectations of treatment reviewed.  - Given pt's grossly involved lymph nodes, with possible residual positive nodes given that PA nodes that were reviewed were negative for malignancy but PET had avid node, would recommend repeat imaging following systemic treatment and evaluate for consideration of EBRT versus VBT as pt also had tumor spillage with uterine manipulator and at time of colpotomy.  - Signs/symptoms of recurrence reviewed. - Will plan to have pt follow-up with me for surveillance once she has completed her adjuvant treatment.   RTC following adjuvant treatment.  Bernadene Bell, MD Gynecologic Oncology   Medical Decision Making I personally spent  TOTAL 30 minutes face-to-face and non-face-to-face in the care of this patient, which includes all pre, intra, and post visit time on the date of  service.  ----------------------- Reason for Visit: Postop/Treatment counseling  Treatment History: Oncology History Overview Note  Endometrioid high grade dMMR abnormal   Endometrial cancer ([place])  01/13/2022 Imaging   CT Abdomen/Pelvis: IMPRESSION: Heterogenous cystic mass centered in the cervix concerning for neoplasm. Endometrial thickening possibly due to obstruction from the cystic mass. Direct visualization is recommended.   Enlarged right para-aortic and right external iliac nodes are indeterminate but concerning for metastasis given cervical lesion.   Borderline enlargement and fecalization of the distal ileum. No definite transition point. Findings suggestive of slow transit/constipation.   01/14/2022 Initial Biopsy   Endometrial biopsy and ECC: A. ENDOCERVIX, CURETTAGE: - BENIGN ENDOCERVICAL MUCOSA WITH ACUTE AND CHRONIC INFLAMMATION - NEGATIVE FOR MALIGNANCY  B. 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  The tumor cells are partially positive for p16. The p53 stain shows foci with wild-type staining and foci with overexpression.  The tumor cells show patchy positivity for ER and PR and are negative for Napsin A.  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  Drs. Doreene Eland reviewed the case and agree with the above diagnosis.  Clinical correlation recommended.    01/14/2022 Initial Diagnosis   Endometrial cancer ([place])   01/24/2022 Imaging   MR pelvis 1. There is an enhancing mass identified within the lower uterine segment which measures 4.3 x 3.9 by 4.7 cm. This invades the anterior myometrium within the lower uterine segment. This appears to terminate at the level of the  internal os of the cervix. Findings are compatible with  endometrial carcinoma. 2. Aortocaval, right common iliac, and bilateral pelvic sidewall lymph nodes are enlarged compatible with metastatic adenopathy. Given the presence of periaortic and pelvic node involvement imaging findings are compatible with stage IIIC2 disease.   02/06/2022 PET scan   1. Diffusely increased uptake within the endometrium and cervix compatible with known endometrial carcinoma. 2. Tracer avid retroperitoneal and pelvic lymph nodes compatible with nodal metastasis. 3. No signs of solid organ metastasis or distant metastatic disease. 4. Nonspecific, diffuse increased bone marrow uptake is noted within the axial and appendicular skeleton. No focal areas of increased uptake to suggest osseous metastasis. 5. Multifocal bilateral areas of fibrosis, architectural distortion and scarring within both lungs which is favored to represent a inflammatory or infectious process. Findings may be the sequelae of prior atypical viral infection.   02/10/2022 Pathology Results   A. RIGHT AORTOCAVAL LYMPH NODE, EXCISION: Two benign lymph nodes, negative for carcinoma (0/2)  B. RIGHT PELVIC LYMPH NODE, EXCISION: Two of two lymph nodes with metastatic adenocarcinoma (2/2)  C. LEFT OBTURATOR PELVIC LYMPH NODE, EXCISION: Three lymph nodes, negative for carcinoma (0/3)  D. UTERUS WITH RIGHT AND LEFT FALLOPIAN TUBE AND OVARY, HYSTERECTOMY AND BILATERAL SALPINGO-OOPHORECTOMY: Invasive moderate to poorly differentiated endometrioid adenocarcinoma, FIGO 3 Tumor measures 5.8 x 3.0 and invades 1.0 cm and 30% of the myometrium (10 of 33 mm) (pT1a) Tumor extends into the anterior lower uterine segment Background complex atypical hyperplasia (EIN) Angiolymphatic invasion present Focal adenomyosis Benign leiomyoma, intramural, measuring 0.6 cm in greatest dimension Chronic cervicitis with squamous metaplasia Benign cystic follicles and luteal cyst  of left ovary Bilateral hydrosalpinx Benign right ovary  E. RIGHT POSTERIOR CERVICAL MARGIN, EXCISION: Benign cervical stroma Negative for carcinoma  ONCOLOGY TABLE:  UTERUS, CARCINOMA OR CARCINOSARCOMA: Resection  Procedure: Total hysterectomy and bilateral salpingo-oophorectomy with node sampling Histologic Type: Endometrioid adenocarcinoma Histologic Grade: High-grade, FIGO 3 Myometrial Invasion:      Depth of Myometrial Invasion (mm): 10 mm      Myometrial Thickness (mm): 33 mm      Percentage of Myometrial Invasion: 30% Uterine Serosa Involvement: Not identified Cervical stromal Involvement: Not identified Extent of involvement of other tissue/organs: Not identified Peritoneal/Ascitic Fluid: []$  Lymphovascular Invasion: Present Regional Lymph Nodes:      Pelvic Lymph Nodes Examined: 5 nonsentinel lymph nodes      Pelvic Lymph Nodes with Metastasis: 2          Macrometastasis: (>2.0 mm): 2          Micrometastasis: (>0.2 mm and < 2.0 mm): 0          Isolated Tumor Cells (<0.2 mm): 0          Laterality of Lymph Node with Tumor: Right          Extracapsular Extension: Not identified      Para-aortic Lymph Nodes Examined: 2 nonsentinel lymph nodes       Para-aortic Lymph Nodes with Metastasis: 0          Macrometastasis: (>2.0 mm): 0          Micrometastasis: (>0.2 mm and < 2.0 mm): 0          Isolated Tumor Cells (<0.2 mm): 0 Distant Metastasis: Not applicable Pathologic Stage Classification (pTNM, AJCC 8th Edition): pT1a, pN1a  Ancillary Studies: MMR testing has been ordered and the results will be issued within an addendum to this report.    02/10/2022 Surgery   Preoperative Diagnosis: Endometrioid endometrial cancer FIGO grade 2, Morbid obesity (  BMI 44)    Procedures: Robotic-assisted total laparoscopic hysterectomy, bilateral salpingo-oophorectomy, bilateral sentinel lymph node injection and debulking of enlarge, PET avid pelvic and para-aortic lymph nodes (Modifer  22: extreme morbid obesity, BMI 44, with significant retroperitoneal and intraperitoneal adiposity requiring additional OR personnel for positioning and retraction. Obesity made retroperitoneal visualization limited, increasing the complexity of the case and necessitating additional instrumentation for retraction and to create safe exposure. Obesity related complexity increased the duration of the procedure by 60 minutes.)   Surgeon: Bernadene Bell, MD    Findings: Normal upper abdominal survey with normal liver surface and diaphragm. Normal appearing small and large bowel. Globally enlarged uterus. Overall normal tubes, and ovaries, small paratubal cyst. No evidence of peritoneal disease, ascites, or carcinomatosis. Enlarged bilateral pelvic and right para-aortic lymph nodes removed. Tumor spill into the vagina and pelvis noted with colpotomy. Pelvis and vagina copiously irrigated. Significant intra-abdominal adiposity.   02/23/2022 Cancer Staging   Staging form: Corpus Uteri - Carcinoma and Carcinosarcoma, AJCC 8th Edition - Pathologic stage from 02/23/2022: Stage IIIC1 (pT1a, pN1, cM0) - Signed by Heath Lark, MD on 02/23/2022 Stage prefix: Initial diagnosis     Interval History: Pt reports that she is recovering well from surgery. She is using motrin as needed for pain. She is eating and drinking but has noticed some gassiness from certain foods. She is voiding without issue and having regular bowel movements. No bleeding.  Past Medical/Surgical History: Past Medical History:  Diagnosis Date   Dyspnea    with activity   Endometrial cancer ([place])    Fast heart beat    with activity   Fatigue    Hearing loss    Left ear   History of kidney stones    12 years ago   Lazy eye, left    Left leg numbness    occ   Migraine    occ    Past Surgical History:  Procedure Laterality Date   CHOLECYSTECTOMY  2011   ROBOTIC ASSISTED TOTAL HYSTERECTOMY WITH BILATERAL SALPINGO OOPHERECTOMY N/A  02/10/2022   Procedure: XI ROBOTIC ASSISTED TOTAL HYSTERECTOMY WITH BILATERAL SALPINGO OOPHORECTOMY;  Surgeon: Bernadene Bell, MD;  Location: [facility];  Service: Gynecology;  Laterality: N/A;   ROBOTIC PELVIC AND PARA-AORTIC LYMPH NODE DISSECTION N/A 02/10/2022   Procedure: XI ROBOTIC PELVIC AND PARA-AORTIC SENTINEL LYMPH NODE DISSECTION;  Surgeon: Bernadene Bell, MD;  Location: [facility];  Service: Gynecology;  Laterality: N/A;    Family History  Problem Relation Age of Onset   Colon cancer Neg Hx    Breast cancer Neg Hx    Ovarian cancer Neg Hx    Endometrial cancer Neg Hx    Pancreatic cancer Neg Hx    Prostate cancer Neg Hx     Social History   Socioeconomic History   Marital status: Significant Other    Spouse name: Not on file   Number of children: Not on file   Years of education: Not on file   Highest education level: Not on file  Occupational History   Not on file  Tobacco Use   Smoking status: Never   Smokeless tobacco: Never  Vaping Use   Vaping Use: Never used  Substance and Sexual Activity   Alcohol use: Yes    Comment: every weekend   Drug use: No   Sexual activity: Yes    Birth control/protection: None  Other Topics Concern   Not on file  Social History Narrative   Not on file  Social Determinants of Health   Financial Resource Strain: Not on file  Food Insecurity: No Food Insecurity (02/10/2022)   Hunger Vital Sign    Worried About Running Out of Food in the Last Year: Never true    Ran Out of Food in the Last Year: Never true  Transportation Needs: No Transportation Needs (02/10/2022)   PRAPARE - Hydrologist (Medical): No    Lack of Transportation (Non-Medical): No  Physical Activity: Not on file  Stress: Not on file  Social Connections: Not on file    Current Medications:  Current Outpatient Medications:    ibuprofen (ADVIL) 800 MG tablet, Take 1 tablet (800 mg total) by mouth every 8 (eight) hours as needed for  moderate pain. For AFTER surgery only, Disp: 30 tablet, Rfl: 0   oxyCODONE (OXY IR/ROXICODONE) 5 MG immediate release tablet, Take 1 tablet (5 mg total) by mouth every 4 (four) hours as needed for severe pain. For AFTER surgery, do not take and drive, do not take with tramadol, Disp: 15 tablet, Rfl: 0   senna-docusate (SENOKOT-S) 8.6-50 MG tablet, Take 2 tablets by mouth at bedtime. For AFTER surgery, do not take if having diarrhea, Disp: 30 tablet, Rfl: 0   ferrous sulfate 325 (65 FE) MG EC tablet, Take 1 tablet (325 mg total) by mouth in the morning and at bedtime. (Patient not taking: Reported on 02/23/2022), Disp: 60 tablet, Rfl: 3  Review of Symptoms: Complete 10-system review is positive for: change in appetite, fatigue, abdominal pain, cramps, itch  Physical Exam: BP (!) 145/76 (BP Location: Right Arm, Patient Position: Sitting)   Pulse 92   Temp 98.4 F (36.9 C) (Oral)   Resp 16   Ht 5' 1"$  (1.549 m)   Wt 224 lb (101.6 kg)   LMP 01/06/2022   SpO2 99%   BMI 42.32 kg/m  General: Alert, oriented, no acute distress. HEENT: Normocephalic, atraumatic. Neck symmetric without masses. Sclera anicteric.  Chest: Normal work of breathing. Clear to auscultation bilaterally.   Cardiovascular: Regular rate and rhythm, no murmurs. Abdomen: Soft, nontender.  Normoactive bowel sounds.    Well-healing incisions. Extremities: Grossly normal range of motion.  Warm, well perfused.  No edema bilaterally. Skin: No rashes or lesions noted. GU: Normal appearing external genitalia without erythema, excoriation, or lesions.  Speculum exam reveals intact well healing vaginal cuff.  Bimanual exam reveals smooth intact vaginal cuff without induration or tenderness. No pelvic mass. Exam chaperoned by Kimberly Martinique, [place]   Laboratory & Radiologic Studies: FINAL MICROSCOPIC DIAGNOSIS:  A. RIGHT AORTOCAVAL LYMPH NODE, EXCISION: Two benign lymph nodes, negative for carcinoma (0/2)  B. RIGHT PELVIC LYMPH NODE,  EXCISION: Two of two lymph nodes with metastatic adenocarcinoma (2/2)  C. LEFT OBTURATOR PELVIC LYMPH NODE, EXCISION: Three lymph nodes, negative for carcinoma (0/3)  D. 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 RIGHT POSTERIOR CERVICAL MARGIN, EXCISION: Benign cervical stroma Negative for carcinoma  ONCOLOGY TABLE:  UTERUS, CARCINOMA OR CARCINOSARCOMA: Resection  Procedure: Total  hysterectomy and bilateral salpingo-oophorectomy with node sampling Histologic Type: Endometrioid adenocarcinoma Histologic Grade: High-grade, FIGO 3 Myometrial Invasion:      Depth of Myometrial Invasion (mm): 10 mm      Myometrial Thickness (mm): 33 mm      Percentage of Myometrial Invasion: 30% Uterine Serosa Involvement: Not identified Cervical stromal Involvement: Not identified Extent of involvement of other tissue/organs: Not identified Peritoneal/Ascitic Fluid: []$  Lymphovascular Invasion: Present Regional Lymph Nodes:      Pelvic Lymph Nodes Examined: 5 nonsentinel lymph nodes      Pelvic Lymph Nodes with Metastasis: 2          Macrometastasis: (>2.0 mm): 2          Micrometastasis: (>0.2 mm and < 2.0 mm): 0          Isolated Tumor Cells (<0.2 mm): 0          Laterality of Lymph Node with Tumor: Right          Extracapsular Extension: Not identified      Para-aortic Lymph Nodes Examined: 2  nonsentinel lymph nodes       Para-aortic Lymph Nodes with Metastasis: 0          Macrometastasis: (>2.0 mm): 0          Micrometastasis: (>0.2 mm and < 2.0 mm): 0          Isolated Tumor Cells (<0.2 mm): 0 Distant Metastasis: Not applicable Pathologic Stage Classification (pTNM, AJCC 8th Edition): pT1a, pN1a Ancillary Studies: MMR testing has been ordered and the results will be issued within an addendum to this report. Representative Tumor Block: D3 Comment(s): An immunohistochemical stain for pancytokeratin is performed with adequate control on each of the negative lymph nodes and are negative for occult epithelial cells.  An immunohistochemical stain for p53 is performed with adequate control on the endometrioid adenocarcinoma and shows variable weak to focally strong staining within the tumor consistent with a wild-type staining pattern. (v4.2.0.1)   IHC EXPRESSION RESULTS   TEST           RESULT  MLH1:          LOSS OF NUCLEAR EXPRESSION  MSH2:          Preserved nuclear expression  MSH6:          Preserved nuclear expression  PMS2:          LOSS OF NUCLEAR EXPRESSION

## 2022-02-23 NOTE — Patient Instructions (Signed)
It was a pleasure to see you in clinic today. - Healing well. - No lifting >10lbs for 6 weeks. - Nothing in the vagina for 8 weeks. - I'll follow-up with you after you complete your treatments.  Thank you very much for allowing me to provide care for you today.  I appreciate your confidence in choosing our Gynecologic Oncology team at [facility].  If you have any questions about your visit today please call our office or send [place] a MyChart message and we will get back to you as soon as possible.

## 2022-03-02 ENCOUNTER — Inpatient Hospital Stay (HOSPITAL_BASED_OUTPATIENT_CLINIC_OR_DEPARTMENT_OTHER): Payer: Self-pay | Admitting: Hematology and Oncology

## 2022-03-02 ENCOUNTER — Telehealth: Payer: Self-pay | Admitting: Hematology and Oncology

## 2022-03-02 ENCOUNTER — Encounter: Payer: Self-pay | Admitting: Hematology and Oncology

## 2022-03-02 ENCOUNTER — Other Ambulatory Visit: Payer: Self-pay

## 2022-03-02 VITALS — BP 149/73 | HR 89 | Temp 98.5°F | Resp 18 | Ht 61.0 in | Wt 230.0 lb

## 2022-03-02 DIAGNOSIS — D509 Iron deficiency anemia, unspecified: Secondary | ICD-10-CM

## 2022-03-02 DIAGNOSIS — C541 Malignant neoplasm of endometrium: Secondary | ICD-10-CM

## 2022-03-02 DIAGNOSIS — E669 Obesity, unspecified: Secondary | ICD-10-CM

## 2022-03-02 MED ORDER — DEXAMETHASONE 4 MG PO TABS: ORAL_TABLET | ORAL | 6 refills | Status: DC

## 2022-03-02 MED ORDER — PROCHLORPERAZINE MALEATE 10 MG PO TABS: 10.0000 mg | ORAL_TABLET | Freq: Four times a day (QID) | ORAL | 1 refills | Status: AC | PRN

## 2022-03-02 MED ORDER — ONDANSETRON HCL 8 MG PO TABS: 8.0000 mg | ORAL_TABLET | Freq: Three times a day (TID) | ORAL | 1 refills | Status: AC | PRN

## 2022-03-02 MED ORDER — LIDOCAINE-PRILOCAINE 2.5-2.5 % EX CREA: TOPICAL_CREAM | CUTANEOUS | 3 refills | Status: DC

## 2022-03-02 NOTE — Telephone Encounter (Signed)
Reached out to patient to schedule genetics, spoke with daughter, she is aware of date and time of appointment,

## 2022-03-02 NOTE — Progress Notes (Signed)
START ON PATHWAY REGIMEN - Uterine     Cycles 1 through 6: A cycle is every 21 days:     Dostarlimab-gxly      Paclitaxel      Carboplatin    Cycles 7 and beyond: A cycle is every 42 days:     Dostarlimab-gxly   **Always confirm dose/schedule in your pharmacy ordering system**  Patient Characteristics: Endometrioid, Newly Diagnosed, Postoperative (Pathologic Staging), Stage IIIC1/IIIC2 - Grade 1, 2, or 3, MSI-H/dMMR Histology: Endometrioid Therapeutic Status: Newly Diagnosed, Postoperative (Pathologic Staging) AJCC M Category: cM0 AJCC 8 Stage Grouping: IIIC1 AJCC T Category: pT1 AJCC N Category: pN1 Microsatellite/Mismatch Repair Status: MSI-H/dMMR Intent of Therapy: Curative Intent, Discussed with Patient

## 2022-03-02 NOTE — Assessment & Plan Note (Addendum)
We reviewed the NCCN guidelines We discussed the role of chemotherapy. The intent is of curative intent.  We discussed some of the risks, benefits, side-effects of carboplatin & Taxol & Dostarlimab. Treatment is intravenous, every 3 weeks x 6 cycles followed by maintenance immunotherapy and radiation treatment  Some of the short term side-effects included, though not limited to, including weight loss, life threatening infections, risk of allergic reactions, need for transfusions of blood products, nausea, vomiting, change in bowel habits, loss of hair, admission to hospital for various reasons, and risks of death.   Long term side-effects are also discussed including risks of infertility, permanent damage to nerve function, hearing loss, chronic fatigue, kidney damage with possibility needing hemodialysis, and rare secondary malignancy including bone marrow disorders.  The patient is aware that the response rates discussed earlier is not guaranteed.  After a long discussion, patient made an informed decision to proceed with the prescribed plan of care.   Patient education material was dispensed. We discussed premedication with dexamethasone before chemotherapy. I recommend chemo education class and port placement We will get her treatment started on March 12 per patient request I will see her prior to cycle 2 of therapy We discussed referral to genetics

## 2022-03-02 NOTE — Progress Notes (Signed)
[facility] CONSULT NOTE  Maureen Campos Care Team: Maureen Campos, No Pcp Per as PCP - General (General Practice)  ASSESSMENT & PLAN:  Endometrial cancer ([place]) We reviewed the NCCN guidelines We discussed the role of chemotherapy. The intent is of curative intent.  We discussed some of the risks, benefits, side-effects of carboplatin & Taxol & Dostarlimab. Treatment is intravenous, every 3 weeks x 6 cycles followed by maintenance immunotherapy and radiation treatment  Some of the short term side-effects included, though not limited to, including weight loss, life threatening infections, risk of allergic reactions, need for transfusions of blood products, nausea, vomiting, change in bowel habits, loss of hair, admission to hospital for various reasons, and risks of death.   Long term side-effects are also discussed including risks of infertility, permanent damage to nerve function, hearing loss, chronic fatigue, kidney damage with possibility needing hemodialysis, and rare secondary malignancy including bone marrow disorders.  The Maureen Campos is aware that the response rates discussed earlier is not guaranteed.  After a long discussion, Maureen Campos made an informed decision to proceed with the prescribed plan of care.   Maureen Campos education material was dispensed. We discussed premedication with dexamethasone before chemotherapy. I recommend chemo education class and port placement We will get her treatment started on March 12 per Maureen Campos request I will see her prior to cycle 2 of therapy We discussed referral to genetics  Microcytic anemia This is due to iron deficiency She tolerated oral iron supplement well She will continue the same I will check iron studies in her next visit  Obesity, Class III, BMI 40-49.9 (morbid obesity) ([place]) The calculated dose of chemotherapy comes back quite high due to class III obesity We discussed importance of dietary modification during treatment I will keep  maximum dose of carboplatin 750 mg  Orders Placed This Encounter  Procedures   IR IMAGING GUIDED PORT INSERTION    Standing Status:   Future    Standing Expiration Date:   03/03/2023    Order Specific Question:   Reason for Exam (SYMPTOM  OR DIAGNOSIS REQUIRED)    Answer:   need port for chemo    Order Specific Question:   Is the Maureen Campos pregnant?    Answer:   No    Order Specific Question:   Preferred Imaging Location?    Answer:   [facility]   CBC with Differential ([place] Only)    Standing Status:   Future    Standing Expiration Date:   03/18/2023   CMP ([place] only)    Standing Status:   Future    Standing Expiration Date:   03/18/2023   T4    Standing Status:   Future    Standing Expiration Date:   03/18/2023   TSH    Standing Status:   Future    Standing Expiration Date:   03/18/2023   CBC with Differential ([place] Only)    Standing Status:   Future    Standing Expiration Date:   04/08/2023   CMP ([place] only)    Standing Status:   Future    Standing Expiration Date:   04/08/2023   CBC with Differential ([place] Only)    Standing Status:   Future    Standing Expiration Date:   04/29/2023   CMP ([place] only)    Standing Status:   Future    Standing Expiration Date:   04/29/2023   T4    Standing Status:   Future  Standing Expiration Date:   04/29/2023   TSH    Standing Status:   Future    Standing Expiration Date:   04/29/2023   CBC with Differential ([facility] Only)    Standing Status:   Future    Standing Expiration Date:   05/20/2023   CMP ([place] only)    Standing Status:   Future    Standing Expiration Date:   05/20/2023   CBC with Differential ([facility] Only)    Standing Status:   Future    Standing Expiration Date:   06/10/2023   CMP ([place] only)    Standing Status:   Future    Standing Expiration Date:   06/10/2023   CBC with Differential ([place] Only)    Standing Status:   Future     Standing Expiration Date:   07/01/2023   CMP ([place] only)    Standing Status:   Future    Standing Expiration Date:   07/01/2023   T4    Standing Status:   Future    Standing Expiration Date:   07/01/2023   TSH    Standing Status:   Future    Standing Expiration Date:   07/01/2023   CBC with Differential ([facility] Only)    Standing Status:   Future    Standing Expiration Date:   07/22/2023   CMP ([place] only)    Standing Status:   Future    Standing Expiration Date:   07/22/2023   T4    Standing Status:   Future    Standing Expiration Date:   07/22/2023   TSH    Standing Status:   Future    Standing Expiration Date:   07/22/2023   CBC with Differential ([facility] Only)    Standing Status:   Future    Standing Expiration Date:   09/02/2023   CMP ([place] only)    Standing Status:   Future    Standing Expiration Date:   09/02/2023   T4    Standing Status:   Future    Standing Expiration Date:   09/02/2023   TSH    Standing Status:   Future    Standing Expiration Date:   09/02/2023   CBC with Differential ([place] Only)    Standing Status:   Future    Standing Expiration Date:   10/14/2023   CMP ([place] only)    Standing Status:   Future    Standing Expiration Date:   10/14/2023   T4    Standing Status:   Future    Standing Expiration Date:   10/14/2023   TSH    Standing Status:   Future    Standing Expiration Date:   10/14/2023   CBC with Differential ([place] Only)    Standing Status:   Future    Standing Expiration Date:   11/25/2023   CMP ([place] only)    Standing Status:   Future    Standing Expiration Date:   11/25/2023   T4    Standing Status:   Future    Standing Expiration Date:   11/25/2023   TSH    Standing Status:   Future    Standing Expiration Date:   11/25/2023   CBC with Differential ([place] Only)    Standing Status:   Future    Standing Expiration Date:   01/06/2024   CMP ([place] only)     Standing Status:  Future    Standing Expiration Date:   01/06/2024   T4    Standing Status:   Future    Standing Expiration Date:   01/06/2024   TSH    Standing Status:   Future    Standing Expiration Date:   01/06/2024   CBC with Differential ([facility] Only)    Standing Status:   Future    Standing Expiration Date:   02/17/2024   CMP ([place] only)    Standing Status:   Future    Standing Expiration Date:   02/17/2024   T4    Standing Status:   Future    Standing Expiration Date:   02/17/2024   TSH    Standing Status:   Future    Standing Expiration Date:   02/17/2024   Iron and Iron Binding Capacity ([facility] only)    Standing Status:   Future    Standing Expiration Date:   03/03/2023   Ferritin    Standing Status:   Future    Standing Expiration Date:   03/02/2023   Ambulatory referral to Genetics    Referral Priority:   Routine    Referral Type:   Consultation    Referral Reason:   Specialty Services Required    Number of Visits Requested:   1    The total time spent in the appointment was 60 minutes encounter with patients including review of chart and various tests results, discussions about plan of care and coordination of care plan   All questions were answered. The Maureen Campos knows to call the clinic with any problems, questions or concerns. No barriers to learning was detected.  Heath Lark, MD 2/26/20242:53 PM  CHIEF COMPLAINTS/PURPOSE OF CONSULTATION:  Urine cancer, for adjuvant treatment  HISTORY OF PRESENTING ILLNESS:  Maureen Campos 52 y.o. female is here because of recent diagnosis of uterine cancer She is here accompanied by her daughter She was diagnosed with uterine cancer after abnormal uterine bleeding  I have reviewed her chart and materials related to her cancer extensively and collaborated history with the Maureen Campos. Summary of oncologic history is as follows: Oncology History Overview Note  Endometrioid high grade dMMR abnormal   Endometrial  cancer ([place])  01/13/2022 Imaging   CT Abdomen/Pelvis: IMPRESSION: Heterogenous cystic mass centered in the cervix concerning for neoplasm. Endometrial thickening possibly due to obstruction from the cystic mass. Direct visualization is recommended.   Enlarged right para-aortic and right external iliac nodes are indeterminate but concerning for metastasis given cervical lesion.   Borderline enlargement and fecalization of the distal ileum. No definite transition point. Findings suggestive of slow transit/constipation.   01/14/2022 Initial Biopsy   Endometrial biopsy and ECC: A. ENDOCERVIX, CURETTAGE: - BENIGN ENDOCERVICAL MUCOSA WITH ACUTE AND CHRONIC INFLAMMATION - NEGATIVE FOR MALIGNANCY  B. 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  The tumor cells are partially positive for p16. The p53 stain shows foci with wild-type staining and foci with overexpression.  The tumor cells show patchy positivity for ER and PR and are negative for Napsin A.  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  Drs. Doreene Eland reviewed the case and agree with the above diagnosis.  Clinical correlation recommended.    01/14/2022 Initial Diagnosis   Endometrial cancer (  [place])   01/24/2022 Imaging   MR pelvis 1. There is an enhancing mass identified within the lower uterine segment which measures 4.3 x 3.9 by 4.7 cm. This invades the anterior myometrium within the lower uterine segment. This appears to terminate at the level of the internal os of the cervix. Findings are compatible with endometrial carcinoma. 2. Aortocaval, right common iliac, and bilateral pelvic sidewall lymph nodes are enlarged compatible with metastatic  adenopathy. Given the presence of periaortic and pelvic node involvement imaging findings are compatible with stage IIIC2 disease.   02/06/2022 PET scan   1. Diffusely increased uptake within the endometrium and cervix compatible with known endometrial carcinoma. 2. Tracer avid retroperitoneal and pelvic lymph nodes compatible with nodal metastasis. 3. No signs of solid organ metastasis or distant metastatic disease. 4. Nonspecific, diffuse increased bone marrow uptake is noted within the axial and appendicular skeleton. No focal areas of increased uptake to suggest osseous metastasis. 5. Multifocal bilateral areas of fibrosis, architectural distortion and scarring within both lungs which is favored to represent a inflammatory or infectious process. Findings may be the sequelae of prior atypical viral infection.   02/10/2022 Pathology Results   A. RIGHT AORTOCAVAL LYMPH NODE, EXCISION: Two benign lymph nodes, negative for carcinoma (0/2)  B. RIGHT PELVIC LYMPH NODE, EXCISION: Two of two lymph nodes with metastatic adenocarcinoma (2/2)  C. LEFT OBTURATOR PELVIC LYMPH NODE, EXCISION: Three lymph nodes, negative for carcinoma (0/3)  D. 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 RIGHT POSTERIOR CERVICAL MARGIN, EXCISION: Benign cervical stroma Negative for carcinoma  ONCOLOGY TABLE:  UTERUS, CARCINOMA OR  CARCINOSARCOMA: Resection  Procedure: Total hysterectomy and bilateral salpingo-oophorectomy with node sampling Histologic Type: Endometrioid adenocarcinoma Histologic Grade: High-grade, FIGO 3 Myometrial Invasion:      Depth of Myometrial Invasion (mm): 10 mm      Myometrial Thickness (mm): 33 mm      Percentage of Myometrial Invasion: 30% Uterine Serosa Involvement: Not identified Cervical stromal Involvement: Not identified Extent of involvement of other tissue/organs: Not identified Peritoneal/Ascitic Fluid: '[]'$  Lymphovascular Invasion: Present Regional Lymph Nodes:      Pelvic Lymph Nodes Examined: 5 nonsentinel lymph nodes      Pelvic Lymph Nodes with Metastasis: 2          Macrometastasis: (>2.0 mm): 2          Micrometastasis: (>0.2 mm and < 2.0 mm): 0          Isolated Tumor Cells (<0.2 mm): 0          Laterality of Lymph Node with Tumor: Right          Extracapsular Extension: Not identified      Para-aortic Lymph Nodes Examined: 2 nonsentinel lymph nodes       Para-aortic Lymph Nodes with Metastasis: 0          Macrometastasis: (>2.0 mm): 0          Micrometastasis: (>0.2 mm and < 2.0 mm): 0  Isolated Tumor Cells (<0.2 mm): 0 Distant Metastasis: Not applicable Pathologic Stage Classification (pTNM, AJCC 8th Edition): pT1a, pN1a  Ancillary Studies: MMR testing has been ordered and the results will be issued within an addendum to this report.    02/10/2022 Surgery   Preoperative Diagnosis: Endometrioid endometrial cancer FIGO grade 2, Morbid obesity (BMI 44)    Procedures: Robotic-assisted total laparoscopic hysterectomy, bilateral salpingo-oophorectomy, bilateral sentinel lymph node injection and debulking of enlarge, PET avid pelvic and para-aortic lymph nodes (Modifer 22: extreme morbid obesity, BMI 44, with significant retroperitoneal and intraperitoneal adiposity requiring additional OR personnel for positioning and retraction. Obesity made retroperitoneal  visualization limited, increasing the complexity of the case and necessitating additional instrumentation for retraction and to create safe exposure. Obesity related complexity increased the duration of the procedure by 60 minutes.)   Surgeon: Bernadene Bell, MD    Findings: Normal upper abdominal survey with normal liver surface and diaphragm. Normal appearing small and large bowel. Globally enlarged uterus. Overall normal tubes, and ovaries, small paratubal cyst. No evidence of peritoneal disease, ascites, or carcinomatosis. Enlarged bilateral pelvic and right para-aortic lymph nodes removed. Tumor spill into the vagina and pelvis noted with colpotomy. Pelvis and vagina copiously irrigated. Significant intra-abdominal adiposity.   02/23/2022 Cancer Staging   Staging form: Corpus Uteri - Carcinoma and Carcinosarcoma, AJCC 8th Edition - Pathologic stage from 02/23/2022: Stage IIIC1 (pT1a, pN1, cM0) - Signed by Heath Lark, MD on 02/23/2022 Stage prefix: Initial diagnosis   03/17/2022 -  Chemotherapy   Maureen Campos is on Treatment Plan : UTERINE ENDOMETRIAL Dostarlimab-gxly (500 mg) + Carboplatin (AUC 5) + Paclitaxel (175 mg/m2) q21d x 6 cycles / Dostarlimab-gxly (1000 mg) q42d x 6 cycles        MEDICAL HISTORY:  Past Medical History:  Diagnosis Date   Dyspnea    with activity   Endometrial cancer (H[facility])    Fast heart beat    with activity   Fatigue    Hearing loss    Left ear   History of kidney stones    12 years ago   Lazy eye, left    Left leg numbness    occ   Migraine    occ    SURGICAL HISTORY: Past Surgical History:  Procedure Laterality Date   CHOLECYSTECTOMY  2011   ROBOTIC ASSISTED TOTAL HYSTERECTOMY WITH BILATERAL SALPINGO OOPHERECTOMY N/A 02/10/2022   Procedure: XI ROBOTIC ASSISTED TOTAL HYSTERECTOMY WITH BILATERAL SALPINGO OOPHORECTOMY;  Surgeon: Bernadene Bell, MD;  Location: [facility];  Service: Gynecology;  Laterality: N/A;   ROBOTIC PELVIC AND PARA-AORTIC LYMPH NODE  DISSECTION N/A 02/10/2022   Procedure: XI ROBOTIC PELVIC AND PARA-AORTIC SENTINEL LYMPH NODE DISSECTION;  Surgeon: Bernadene Bell, MD;  Location: [facility];  Service: Gynecology;  Laterality: N/A;    SOCIAL HISTORY: Social History   Socioeconomic History   Marital status: Significant Other    Spouse name: Not on file   Number of children: Not on file   Years of education: Not on file   Highest education level: Not on file  Occupational History   Not on file  Tobacco Use   Smoking status: Never   Smokeless tobacco: Never  Vaping Use   Vaping Use: Never used  Substance and Sexual Activity   Alcohol use: Yes    Comment: every weekend   Drug use: No   Sexual activity: Yes    Birth control/protection: None  Other Topics Concern   Not on file  Social History Narrative   Not  on file   Social Determinants of Health   Financial Resource Strain: Not on file  Food Insecurity: No Food Insecurity (02/10/2022)   Hunger Vital Sign    Worried About Running Out of Food in the Last Year: Never true    Ran Out of Food in the Last Year: Never true  Transportation Needs: No Transportation Needs (02/10/2022)   PRAPARE - Hydrologist (Medical): No    Lack of Transportation (Non-Medical): No  Physical Activity: Not on file  Stress: Not on file  Social Connections: Not on file  Intimate Partner Violence: Not At Risk (02/10/2022)   Humiliation, Afraid, Rape, and Kick questionnaire    Fear of Current or Ex-Partner: No    Emotionally Abused: No    Physically Abused: No    Sexually Abused: No    FAMILY HISTORY: Family History  Problem Relation Age of Onset   Colon cancer Neg Hx    Breast cancer Neg Hx    Ovarian cancer Neg Hx    Endometrial cancer Neg Hx    Pancreatic cancer Neg Hx    Prostate cancer Neg Hx     ALLERGIES:  has No Known Allergies.  MEDICATIONS:  Current Outpatient Medications  Medication Sig Dispense Refill   dexamethasone (DECADRON) 4 MG  tablet Take 2 tabs at the night before and 2 tab the morning of chemotherapy, every 3 weeks, by mouth x 6 cycles 24 tablet 6   ferrous sulfate 325 (65 FE) MG EC tablet Take 1 tablet (325 mg total) by mouth in the morning and at bedtime. (Maureen Campos not taking: Reported on 02/23/2022) 60 tablet 3   ibuprofen (ADVIL) 800 MG tablet Take 1 tablet (800 mg total) by mouth every 8 (eight) hours as needed for moderate pain. For AFTER surgery only 30 tablet 0   lidocaine-prilocaine (EMLA) cream Apply to affected area once 30 g 3   ondansetron (ZOFRAN) 8 MG tablet Take 1 tablet (8 mg total) by mouth every 8 (eight) hours as needed for nausea or vomiting. Start on the third day after chemotherapy. 30 tablet 1   oxyCODONE (OXY IR/ROXICODONE) 5 MG immediate release tablet Take 1 tablet (5 mg total) by mouth every 4 (four) hours as needed for severe pain. For AFTER surgery, do not take and drive, do not take with tramadol 15 tablet 0   prochlorperazine (COMPAZINE) 10 MG tablet Take 1 tablet (10 mg total) by mouth every 6 (six) hours as needed for nausea or vomiting. 30 tablet 1   senna-docusate (SENOKOT-S) 8.6-50 MG tablet Take 2 tablets by mouth at bedtime. For AFTER surgery, do not take if having diarrhea 30 tablet 0   No current facility-administered medications for this visit.    REVIEW OF SYSTEMS:   Constitutional: Denies fevers, chills or abnormal night sweats Eyes: Denies blurriness of vision, double vision or watery eyes Ears, nose, mouth, throat, and face: Denies mucositis or sore throat Respiratory: Denies cough, dyspnea or wheezes Cardiovascular: Denies palpitation, chest discomfort or lower extremity swelling Gastrointestinal:  Denies nausea, heartburn or change in bowel habits Skin: Denies abnormal skin rashes Lymphatics: Denies new lymphadenopathy or easy bruising Neurological:Denies numbness, tingling or new weaknesses Behavioral/Psych: Mood is stable, no new changes  All other systems were  reviewed with the Maureen Campos and are negative.  PHYSICAL EXAMINATION: ECOG PERFORMANCE STATUS: 1 - Symptomatic but completely ambulatory  Vitals:   03/02/22 1341  BP: (!) 149/73  Pulse: 89  Resp: 18  Temp:  98.5 F (36.9 C)  SpO2: 98%   Filed Weights   03/02/22 1341  Weight: 230 lb (104.3 kg)    GENERAL:alert, no distress and comfortable SKIN: skin color, texture, turgor are normal, no rashes or significant lesions EYES: normal, conjunctiva are pink and non-injected, sclera clear OROPHARYNX:no exudate, no erythema and lips, buccal mucosa, and tongue normal  NECK: supple, thyroid normal size, non-tender, without nodularity LYMPH:  no palpable lymphadenopathy in the cervical, axillary or inguinal LUNGS: clear to auscultation and percussion with normal breathing effort HEART: regular rate & rhythm and no murmurs and no lower extremity edema ABDOMEN:abdomen soft, non-tender and normal bowel sounds.  Noted well-healed surgical scar Musculoskeletal:no cyanosis of digits and no clubbing  PSYCH: alert & oriented x 3 with fluent speech NEURO: no focal motor/sensory deficits  LABORATORY DATA:  I have reviewed the data as listed Lab Results  Component Value Date   WBC 9.9 02/12/2022   HGB 8.1 (L) 02/12/2022   HCT 28.3 (L) 02/12/2022   MCV 72.9 (L) 02/12/2022   PLT 426 (H) 02/12/2022   Recent Labs    01/12/22 2223 02/02/22 0951 02/11/22 0433 02/12/22 0414  NA 132* 133* 132* 137  K 3.9 4.2 4.1 4.3  CL 98 102 101 101  CO2 '25 23 24 25  '$ GLUCOSE 171* 165* 168* 134*  BUN 5* '6 7 12  '$ CREATININE 0.56 0.39* 0.48 0.45  CALCIUM 8.8* 8.7* 7.9* 8.0*  GFRNONAA >60 >60 >60 >60  PROT 8.0 7.8  --   --   ALBUMIN 3.3* 3.4*  --   --   AST 42* 17  --   --   ALT 41 17  --   --   ALKPHOS 85 68  --   --   BILITOT 0.5 0.3  --   --     RADIOGRAPHIC STUDIES: I have personally reviewed the radiological images as listed and agreed with the findings in the report. NM PET Image Initial (PI) Skull  Base To Thigh (F-18 FDG)  Result Date: 02/06/2022 CLINICAL DATA:  Initial treatment strategy for endometrial carcinoma and mediastinal adenopathy. EXAM: NUCLEAR MEDICINE PET SKULL BASE TO THIGH TECHNIQUE: 11.57 mCi F-18 FDG was injected intravenously. Full-ring PET imaging was performed from the skull base to thigh after the radiotracer. CT data was obtained and used for attenuation correction and anatomic localization. Fasting blood glucose: 169 mg/dl COMPARISON:  MR pelvis 01/24/2022, CT chest 01/23/2022, and CT AP 01/23/2022 FINDINGS: Mediastinal blood pool activity: SUV max 3.67 Liver activity: SUV max NA NECK: No hypermetabolic lymph nodes in the neck. Incidental CT findings: Bilateral chronic appearing mucosal thickening within the maxillary sinuses. CHEST: No tracer avid mediastinal, hilar, or axillary lymph nodes. No tracer avid pulmonary nodule. Bilateral patchy areas scarring, architectural distortion and fibrosis identified as noted on the previous chest CT from 01/23/2022. Incidental CT findings: None. ABDOMEN/PELVIS: -There is diffusely increased radiotracer uptake identified throughout the endometrium and cervix. SUV max is equal to 20.2. -aortocaval lymph node measures 1.3 cm with SUV max of 9.40, image 119/4. -right external iliac node measures 1.4 cm within SUV max of 11.94, image 151/4. Left external iliac lymph node measures scratch set -left external iliac lymph node measures 0.8 cm within SUV max of 3.29, image 154/4. No abnormal tracer uptake identified within the liver, pancreas, spleen, or adrenal glands. Status post cholecystectomy. Incidental CT findings: Status post cholecystectomy. No ascites or focal fluid collections. No peritoneal nodularity identified. SKELETON: There is diffusely increased radiotracer uptake  identified throughout the axial and appendicular skeleton. No focal areas of increased uptake above background bone marrow activity identified Incidental CT findings: No acute  or suspicious bone lesions. IMPRESSION: 1. Diffusely increased uptake within the endometrium and cervix compatible with known endometrial carcinoma. 2. Tracer avid retroperitoneal and pelvic lymph nodes compatible with nodal metastasis. 3. No signs of solid organ metastasis or distant metastatic disease. 4. Nonspecific, diffuse increased bone marrow uptake is noted within the axial and appendicular skeleton. No focal areas of increased uptake to suggest osseous metastasis. 5. Multifocal bilateral areas of fibrosis, architectural distortion and scarring within both lungs which is favored to represent a inflammatory or infectious process. Findings may be the sequelae of prior atypical viral infection. Electronically Signed   By: Kerby Moors M.D.   On: 02/06/2022 10:41

## 2022-03-02 NOTE — Assessment & Plan Note (Signed)
This is due to iron deficiency She tolerated oral iron supplement well She will continue the same I will check iron studies in her next visit

## 2022-03-02 NOTE — Assessment & Plan Note (Signed)
The calculated dose of chemotherapy comes back quite high due to class III obesity We discussed importance of dietary modification during treatment I will keep maximum dose of carboplatin 750 mg

## 2022-03-03 ENCOUNTER — Telehealth: Payer: Self-pay | Admitting: Hematology and Oncology

## 2022-03-03 ENCOUNTER — Other Ambulatory Visit: Payer: Self-pay | Admitting: Genetic Counselor

## 2022-03-03 DIAGNOSIS — C541 Malignant neoplasm of endometrium: Secondary | ICD-10-CM

## 2022-03-03 NOTE — Telephone Encounter (Signed)
Left patient a vm regarding upcoming appointments

## 2022-03-04 ENCOUNTER — Other Ambulatory Visit: Payer: Self-pay | Admitting: Radiology

## 2022-03-04 ENCOUNTER — Encounter: Payer: Self-pay | Admitting: Genetic Counselor

## 2022-03-04 ENCOUNTER — Inpatient Hospital Stay: Payer: Self-pay

## 2022-03-04 ENCOUNTER — Other Ambulatory Visit: Payer: Self-pay

## 2022-03-04 ENCOUNTER — Inpatient Hospital Stay (HOSPITAL_BASED_OUTPATIENT_CLINIC_OR_DEPARTMENT_OTHER): Payer: Self-pay | Admitting: Genetic Counselor

## 2022-03-04 DIAGNOSIS — Z8049 Family history of malignant neoplasm of other genital organs: Secondary | ICD-10-CM | POA: Insufficient documentation

## 2022-03-04 DIAGNOSIS — C541 Malignant neoplasm of endometrium: Secondary | ICD-10-CM

## 2022-03-04 DIAGNOSIS — Z800 Family history of malignant neoplasm of digestive organs: Secondary | ICD-10-CM

## 2022-03-04 HISTORY — DX: Family history of malignant neoplasm of digestive organs: Z80.0

## 2022-03-04 LAB — GENETIC SCREENING ORDER

## 2022-03-04 NOTE — Progress Notes (Signed)
REFERRING PROVIDER: Heath Lark, MD [place],  [place] [id number]  PRIMARY PROVIDER:  Patient, No Pcp Per  PRIMARY REASON FOR VISIT:  1. Endometrial cancer ([place])   2. Family history of colon cancer   3. Family history of uterine cancer      HISTORY OF PRESENT ILLNESS:   Maureen Campos, a 52 y.o. female, was seen for a [facility] cancer genetics consultation at the request of Dr. Alvy Bimler due to a personal and family history of uterine cancer.  Maureen Campos presents to clinic today to discuss the possibility of a hereditary predisposition to cancer, genetic testing, and to further clarify her future cancer risks, as well as potential cancer risks for family members.   In January 2024, at the age of 45, Maureen Campos was diagnosed with uterine cancer. The treatment plan surgery and chemotherapy. There was IHC loss of MLH1 and PMS2, with hypermethylation suggesting that the tumor is not due to Lynch syndrome. However, based on her personal and family history of young uterine cancer, she meets the current NCCN 2.2023 colorectal cancer guidelines.    CANCER HISTORY:  Oncology History Overview Note  Endometrioid high grade dMMR abnormal   Endometrial cancer ([place])  01/13/2022 Imaging   CT Abdomen/Pelvis: IMPRESSION: Heterogenous cystic mass centered in the cervix concerning for neoplasm. Endometrial thickening possibly due to obstruction from the cystic mass. Direct visualization is recommended.   Enlarged right para-aortic and right external iliac nodes are indeterminate but concerning for metastasis given cervical lesion.   Borderline enlargement and fecalization of the distal ileum. No definite transition point. Findings suggestive of slow transit/constipation.   01/14/2022 Initial Biopsy   Endometrial biopsy and ECC: A. ENDOCERVIX, CURETTAGE: - BENIGN ENDOCERVICAL MUCOSA WITH ACUTE AND CHRONIC INFLAMMATION - NEGATIVE FOR MALIGNANCY  B. 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  The tumor cells are partially positive for p16. The p53 stain shows foci with wild-type staining and foci with overexpression.  The tumor cells show patchy positivity for ER and PR and are negative for Napsin A.  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  Drs. Doreene Eland reviewed the case and agree with the above diagnosis.  Clinical correlation recommended.    01/14/2022 Initial Diagnosis   Endometrial cancer ([place])   01/24/2022 Imaging   MR pelvis 1. There is an enhancing mass identified within the lower uterine segment which measures 4.3 x 3.9 by 4.7 cm. This invades the anterior myometrium within the lower uterine segment. This appears to terminate at the level of the internal os of the cervix. Findings are compatible with endometrial carcinoma. 2. Aortocaval, right common iliac, and bilateral pelvic sidewall lymph nodes are enlarged compatible with metastatic adenopathy. Given the presence of periaortic and pelvic node involvement imaging findings are compatible with stage IIIC2 disease.   02/06/2022 PET scan   1. Diffusely increased uptake within the endometrium and cervix compatible with known endometrial carcinoma. 2. Tracer avid retroperitoneal and pelvic lymph nodes compatible with nodal metastasis. 3. No signs of solid organ metastasis or distant metastatic disease. 4. Nonspecific, diffuse increased bone marrow uptake is noted within the axial and appendicular skeleton. No focal areas of increased uptake to suggest osseous metastasis. 5. Multifocal bilateral areas of fibrosis, architectural distortion  and scarring within both lungs which is favored to represent a inflammatory or  infectious process. Findings may be the sequelae of prior atypical viral infection.   02/10/2022 Pathology Results   A. RIGHT AORTOCAVAL LYMPH NODE, EXCISION: Two benign lymph nodes, negative for carcinoma (0/2)  B. RIGHT PELVIC LYMPH NODE, EXCISION: Two of two lymph nodes with metastatic adenocarcinoma (2/2)  C. LEFT OBTURATOR PELVIC LYMPH NODE, EXCISION: Three lymph nodes, negative for carcinoma (0/3)  D. 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 RIGHT POSTERIOR CERVICAL MARGIN, EXCISION: Benign cervical stroma Negative for carcinoma  ONCOLOGY TABLE:  UTERUS, CARCINOMA OR CARCINOSARCOMA: Resection  Procedure: Total hysterectomy and bilateral salpingo-oophorectomy with node sampling Histologic Type: Endometrioid adenocarcinoma Histologic Grade: High-grade, FIGO 3 Myometrial Invasion:      Depth of Myometrial Invasion (mm): 10 mm      Myometrial Thickness (mm): 33 mm      Percentage of Myometrial Invasion: 30% Uterine Serosa Involvement: Not identified Cervical stromal Involvement: Not identified Extent of involvement of other tissue/organs: Not identified Peritoneal/Ascitic Fluid: '[]'$  Lymphovascular Invasion: Present Regional Lymph Nodes:      Pelvic Lymph Nodes Examined: 5 nonsentinel lymph nodes      Pelvic Lymph Nodes with Metastasis: 2          Macrometastasis: (>2.0 mm): 2           Micrometastasis: (>0.2 mm and < 2.0 mm): 0          Isolated Tumor Cells (<0.2 mm): 0          Laterality of Lymph Node with Tumor: Right          Extracapsular Extension: Not identified      Para-aortic Lymph Nodes Examined: 2 nonsentinel lymph nodes       Para-aortic Lymph Nodes with Metastasis: 0          Macrometastasis: (>2.0 mm): 0          Micrometastasis: (>0.2 mm and < 2.0 mm): 0          Isolated Tumor Cells (<0.2 mm): 0 Distant Metastasis: Not applicable Pathologic Stage Classification (pTNM, AJCC 8th Edition): pT1a, pN1a  Ancillary Studies: MMR testing has been ordered and the results will be issued within an addendum to this report.    02/10/2022 Surgery   Preoperative Diagnosis: Endometrioid endometrial cancer FIGO grade 2, Morbid obesity (BMI 44)    Procedures: Robotic-assisted total laparoscopic hysterectomy, bilateral salpingo-oophorectomy, bilateral sentinel lymph node injection and debulking of enlarge, PET avid pelvic and para-aortic lymph nodes (Modifer 22: extreme morbid obesity, BMI 44, with significant retroperitoneal and intraperitoneal adiposity requiring additional OR personnel for positioning and retraction. Obesity made retroperitoneal visualization limited, increasing the complexity of the case and necessitating additional instrumentation for retraction and to create safe exposure. Obesity related complexity increased the duration of the procedure by 60 minutes.)   Surgeon: Bernadene Bell, MD    Findings: Normal upper abdominal survey with normal liver surface and diaphragm. Normal appearing small and large bowel. Globally enlarged uterus.  Overall normal tubes, and ovaries, small paratubal cyst. No evidence of peritoneal disease, ascites, or carcinomatosis. Enlarged bilateral pelvic and right para-aortic lymph nodes removed. Tumor spill into the vagina and pelvis noted with colpotomy. Pelvis and vagina copiously irrigated. Significant intra-abdominal adiposity.    02/23/2022 Cancer Staging   Staging form: Corpus Uteri - Carcinoma and Carcinosarcoma, AJCC 8th Edition - Pathologic stage from 02/23/2022: Stage IIIC1 (pT1a, pN1, cM0) - Signed by Heath Lark, MD on 02/23/2022 Stage prefix: Initial diagnosis   03/17/2022 -  Chemotherapy   Patient is on Treatment Plan : UTERINE ENDOMETRIAL Dostarlimab-gxly (500 mg) + Carboplatin (AUC 5) + Paclitaxel (175 mg/m2) q21d x 6 cycles / Dostarlimab-gxly (1000 mg) q42d x 6 cycles         RISK FACTORS:  Menarche was at age 47.  First live birth at age 70.  OCP use for approximately 0 years.  Ovaries intact: no.  Hysterectomy: yes.  Menopausal status: postmenopausal.  HRT use: 0 years. Colonoscopy: no; not examined. Mammogram within the last year: no. Number of breast biopsies: 0. Up to date with pelvic exams: yes. Any excessive radiation exposure in the past: no  Past Medical History:  Diagnosis Date   Dyspnea    with activity   Endometrial cancer ([place])    Family history of colon cancer 03/04/2022   Family history of uterine cancer    Fast heart beat    with activity   Fatigue    Hearing loss    Left ear   History of kidney stones    12 years ago   Lazy eye, left    Left leg numbness    occ   Migraine    occ    Past Surgical History:  Procedure Laterality Date   CHOLECYSTECTOMY  2011   ROBOTIC ASSISTED TOTAL HYSTERECTOMY WITH BILATERAL SALPINGO OOPHERECTOMY N/A 02/10/2022   Procedure: XI ROBOTIC ASSISTED TOTAL HYSTERECTOMY WITH BILATERAL SALPINGO OOPHORECTOMY;  Surgeon: Bernadene Bell, MD;  Location: [facility];  Service: Gynecology;  Laterality: N/A;   ROBOTIC PELVIC AND PARA-AORTIC LYMPH NODE DISSECTION N/A 02/10/2022   Procedure: XI ROBOTIC PELVIC AND PARA-AORTIC SENTINEL LYMPH NODE DISSECTION;  Surgeon: Bernadene Bell, MD;  Location: [facility];  Service: Gynecology;  Laterality: N/A;    Social History   Socioeconomic History   Marital status: Significant Other    Spouse name: Not on file    Number of children: Not on file   Years of education: Not on file   Highest education level: Not on file  Occupational History   Not on file  Tobacco Use   Smoking status: Never   Smokeless tobacco: Never  Vaping Use   Vaping Use: Never used  Substance and Sexual Activity   Alcohol use: Yes    Comment: every weekend   Drug use: No   Sexual activity: Yes    Birth control/protection: None  Other Topics Concern   Not on file  Social History Narrative   Not on file   Social Determinants of Health   Financial Resource Strain: Not on file  Food Insecurity: No Food Insecurity (02/10/2022)   Hunger Vital Sign    Worried About Running Out of Food in the Last Year: Never true    Ran Out of Food in the Last Year: Never true  Transportation Needs: No Transportation Needs (02/10/2022)   PRAPARE - Hydrologist (Medical): No    Lack of Transportation (Non-Medical): No  Physical Activity: Not on  file  Stress: Not on file  Social Connections: Not on file     FAMILY HISTORY:  We obtained a detailed, 4-generation family history.  Significant diagnoses are listed below: Family History  Problem Relation Age of Onset   Colon cancer Father 29   Uterine cancer Sister 38   Melanoma Sister 45   Cancer Paternal Aunt        possible brain cancer, dx > 39   Breast cancer Neg Hx    Ovarian cancer Neg Hx    Endometrial cancer Neg Hx    Pancreatic cancer Neg Hx    Prostate cancer Neg Hx      The patient has three daughters and a son who are cancer free. She has four sisters and a brother. Her older sister was diagnosed with uterine cancer at 56 and melanoma at 75.  Currently, it is thought that she has metastatic disease as there are mets in her brain.  All other siblings are younger than the patient.  The patient's parents are living.  The patient's father was diagnosed with colon cancer at 49.  He has one sister who had possibly a brain tumor.  His mother died of  'digestive issues' and his father died young of unknown causes.  The patient's mother does not have cancer.  She has 12 siblings with no reported incidences of cancer. The maternal grandparents were not reported to have cancer.  Maureen Campos is unaware of previous family history of genetic testing for hereditary cancer risks. Patient's maternal ancestors are of [place] descent, and paternal ancestors are of [place] and [place] descent. There is no reported Ashkenazi Jewish ancestry. There is no known consanguinity.  GENETIC COUNSELING ASSESSMENT: Maureen Campos is a 52 y.o. female with a personal and family history of cancer which is somewhat suggestive of a Lynch syndrome and predisposition to cancer given the young ages of uterine cancer diagnosis and the combination of cancers in the family. We, therefore, discussed and recommended the following at today's visit.   DISCUSSION: We discussed that, in general, most cancer is not inherited in families, but instead is sporadic or familial. Sporadic cancers occur by chance and typically happen at older ages (>50 years) as this type of cancer is caused by genetic changes acquired during an individual's lifetime. Some families have more cancers than would be expected by chance; however, the ages or types of cancer are not consistent with a known genetic mutation or known genetic mutations have been ruled out. This type of familial cancer is thought to be due to a combination of multiple genetic, environmental, hormonal, and lifestyle factors. While this combination of factors likely increases the risk of cancer, the exact source of this risk is not currently identifiable or testable.  We discussed that 3 - 5% of uterine cancer is hereditary, with most cases associated with Lynch syndrome.  There are other genes that can be associated with hereditary uterine cancer syndromes.  These include PTEN and a couple others.  We discussed that testing is beneficial  for several reasons including knowing how to follow individuals after completing their treatment, identifying whether potential treatment options such as PARP inhibitors would be beneficial, and understand if other family members could be at risk for cancer and allow them to undergo genetic testing.   We reviewed the characteristics, features and inheritance patterns of hereditary cancer syndromes. We also discussed genetic testing, including the appropriate family members to test, the process of testing, insurance coverage and  turn-around-time for results. We discussed the implications of a negative, positive, carrier and/or variant of uncertain significant result. Maureen Campos  was offered a common hereditary cancer panel (47 genes) and an expanded pan-cancer panel (77 genes). Maureen Campos was informed of the benefits and limitations of each panel, including that expanded pan-cancer panels contain genes that do not have clear management guidelines at this point in time.  We also discussed that as the number of genes included on a panel increases, the chances of variants of uncertain significance increases. Maureen Campos decided to pursue genetic testing for the CancerNext-Expanded+RNAinsight gene panel.   The CancerNext-Expanded gene panel offered by [facility] and includes sequencing and rearrangement analysis for the following 71 genes: AIP, ALK, APC*, ATM*, AXIN2, BAP1, BARD1, BMPR1A, BRCA1*, BRCA2*, BRIP1*, CDC73, CDH1*, CDK4, CDKN1B, CDKN2A, CHEK2*, CTNNA1, DICER1, FH, FLCN, KIF1B, LZTR1, MAX, MEN1, MET, MLH1*, MSH2*, MSH3, MSH6*, MUTYH*, NF1*, NF2, NTHL1, PALB2*, PHOX2B, PMS2*, POT1, PRKAR1A, PTCH1, PTEN*, RAD51C*, RAD51D*, RB1, RET, SDHA, SDHAF2, SDHB, SDHC, SDHD, SMAD4, SMARCA4, SMARCB1, SMARCE1, STK11, SUFU, TMEM127, TP53*, TSC1, TSC2, and VHL (sequencing and deletion/duplication); EGFR, EGLN1, HOXB13, KIT, MITF, PDGFRA, POLD1, and POLE (sequencing only); EPCAM and GREM1 (deletion/duplication only).  DNA and RNA analyses performed for * genes.   Based on Maureen Campos's personal and family history of cancer, she meets medical criteria for genetic testing. Despite that she meets criteria, she may still have an out of pocket cost. We discussed that if her out of pocket cost for testing is over $100, the laboratory will call and confirm whether she wants to proceed with testing.  If the out of pocket cost of testing is less than $100 she will be billed by the genetic testing laboratory.   We discussed that some people do not want to undergo genetic testing due to fear of genetic discrimination.  The Genetic Information Nondiscrimination Act (GINA) was signed into federal law in 2008. GINA prohibits health insurers and most employers from discriminating against individuals based on genetic information (including the results of genetic tests and family history information). According to GINA, health insurance companies cannot consider genetic information to be a preexisting condition, nor can they use it to make decisions regarding coverage or rates. GINA also makes it illegal for most employers to use genetic information in making decisions about hiring, firing, promotion, or terms of employment. It is important to note that GINA does not offer protections for life insurance, disability insurance, or long-term care insurance. GINA does not apply to those in the [employer], those who work for companies with less than 15 employees, and new life insurance or long-term disability insurance policies.  Health status due to a cancer diagnosis is not protected under GINA. More information about GINA can be found by visiting [email or web address].   In order to estimate her chance of having a MMR mutation, we used statistical models (MMRPro) that consider her personal medical history, family history and ancestry.  Because each model is different, there can be a lot of variability in the risks they give.  Therefore, these  numbers must be considered a rough range and not a precise risk of having a MMR mutation.  These models estimate that she has approximately a up to a 59.86% chance of having a mutation. Based on this assessment of her family and personal history, genetic testing is recommended.    Based on the patient's personal and family history, a statistical model (MMRPro) was used to estimate her risk of developing colon cancer.  This estimates her lifetime risk of developing colon cancer to be approximately 21.09%. This estimation does not consider any genetic testing results.      PLAN: After considering the risks, benefits, and limitations, Maureen Campos provided informed consent to pursue genetic testing and the blood sample was sent to [facility] for analysis of the CancerNext-Expanded+RNAinsight. Results should be available within approximately 2-3 weeks' time, at which point they will be disclosed by telephone to Maureen Campos, as will any additional recommendations warranted by these results. Maureen Campos will receive a summary of her genetic counseling visit and a copy of her results once available. This information will also be available in Epic.   Lastly, we encouraged Maureen Campos to remain in contact with cancer genetics annually so that we can continuously update the family history and inform her of any changes in cancer genetics and testing that may be of benefit for this family.   Maureen Campos questions were answered to her satisfaction today. Our contact information was provided should additional questions or concerns arise. Thank you for the referral and allowing [place] to share in the care of your patient.   Donnelle Rubey P. Florene Glen, [place], [facility] Licensed, Insurance risk surveyor [email or web address] phone: [phone]  The patient was seen for a total of 35 minutes in face-to-face genetic counseling.  The patient brought her daughter. Drs. Michell Heinrich, and/or [place] were available  for questions, if needed..    _______________________________________________________________________ For Office Staff:  Number of people involved in session: 2 Was an Intern/ student involved with case: no

## 2022-03-05 ENCOUNTER — Ambulatory Visit (HOSPITAL_COMMUNITY)
Admission: RE | Admit: 2022-03-05 | Discharge: 2022-03-05 | Disposition: A | Discharge status: Home / Self Care | Payer: Self-pay | Source: Ambulatory Visit | Attending: Hematology and Oncology | Admitting: Hematology and Oncology

## 2022-03-05 ENCOUNTER — Other Ambulatory Visit: Payer: Self-pay

## 2022-03-05 ENCOUNTER — Encounter (HOSPITAL_COMMUNITY): Payer: Self-pay

## 2022-03-05 DIAGNOSIS — C541 Malignant neoplasm of endometrium: Secondary | ICD-10-CM | POA: Insufficient documentation

## 2022-03-05 HISTORY — PX: IR IMAGING GUIDED PORT INSERTION: IMG5740

## 2022-03-05 MED ORDER — HEPARIN SOD (PORK) LOCK FLUSH 100 UNIT/ML IV SOLN
INTRAVENOUS | Status: AC
  Administered 2022-03-05: 500 [IU] via INTRAVENOUS
  Filled 2022-03-05: qty 5

## 2022-03-05 MED ORDER — MIDAZOLAM HCL 2 MG/2ML IJ SOLN
INTRAMUSCULAR | Status: AC | PRN
  Administered 2022-03-05 (×3): 1 mg via INTRAVENOUS

## 2022-03-05 MED ORDER — LIDOCAINE-EPINEPHRINE 1 %-1:100000 IJ SOLN
INTRAMUSCULAR | Status: AC | PRN
  Administered 2022-03-05: 20 mL

## 2022-03-05 MED ORDER — MIDAZOLAM HCL 2 MG/2ML IJ SOLN
INTRAMUSCULAR | Status: AC
  Filled 2022-03-05: qty 4

## 2022-03-05 MED ORDER — FENTANYL CITRATE (PF) 100 MCG/2ML IJ SOLN
INTRAMUSCULAR | Status: AC
  Filled 2022-03-05: qty 2

## 2022-03-05 MED ORDER — LIDOCAINE-EPINEPHRINE 1 %-1:100000 IJ SOLN
INTRAMUSCULAR | Status: AC
  Filled 2022-03-05: qty 1

## 2022-03-05 MED ORDER — SODIUM CHLORIDE 0.9 % IV SOLN: INTRAVENOUS | Status: DC

## 2022-03-05 MED ORDER — FENTANYL CITRATE (PF) 100 MCG/2ML IJ SOLN
INTRAMUSCULAR | Status: AC | PRN
  Administered 2022-03-05 (×2): 50 ug via INTRAVENOUS

## 2022-03-05 NOTE — Procedures (Signed)
Interventional Radiology Procedure Note  Procedure: Placement of a right IJ approach single lumen PowerPort.  Tip is positioned at the superior cavoatrial junction and catheter is ready for immediate use.  Complications: None Recommendations:  - Ok to shower tomorrow - Do not submerge for 7 days - Routine line care   Signed,  Tanaja Ganger S. Wilhelm Ganaway, DO   

## 2022-03-05 NOTE — H&P (Signed)
Chief Complaint: Patient was seen in consultation today for image-guided port placement   Referring Physician(s): [place]  Supervising Physician: Corrie Mckusick  Patient Status: [facility] - Out-pt  History of Present Illness: Maureen Campos is a 52 y.o. female with PMH significant for endometrial cancer and recent total hysterectomy with bilateral oophorectomy being seen today for image-guided port placement. The patient is followed by Dr Alvy Bimler from Oncology. The patient was only recently diagnosed with endometrial cancer following abnormal uterine bleeding. The patient presents today in her usual state of health and is anxious to have her port placed.   Past Medical History:  Diagnosis Date   Dyspnea    with activity   Endometrial cancer ([place])    Family history of colon cancer 03/04/2022   Family history of uterine cancer    Fast heart beat    with activity   Fatigue    Hearing loss    Left ear   History of kidney stones    12 years ago   Lazy eye, left    Left leg numbness    occ   Migraine    occ    Past Surgical History:  Procedure Laterality Date   CHOLECYSTECTOMY  2011   ROBOTIC ASSISTED TOTAL HYSTERECTOMY WITH BILATERAL SALPINGO OOPHERECTOMY N/A 02/10/2022   Procedure: XI ROBOTIC ASSISTED TOTAL HYSTERECTOMY WITH BILATERAL SALPINGO OOPHORECTOMY;  Surgeon: Bernadene Bell, MD;  Location: [facility] ORS;  Service: Gynecology;  Laterality: N/A;   ROBOTIC PELVIC AND PARA-AORTIC LYMPH NODE DISSECTION N/A 02/10/2022   Procedure: XI ROBOTIC PELVIC AND PARA-AORTIC SENTINEL LYMPH NODE DISSECTION;  Surgeon: Bernadene Bell, MD;  Location: [facility] ORS;  Service: Gynecology;  Laterality: N/A;    Allergies: Patient has no known allergies.  Medications: Prior to Admission medications   Medication Sig Start Date End Date Taking? Authorizing Provider  dexamethasone (DECADRON) 4 MG tablet Take 2 tabs at the night before and 2 tab the morning of chemotherapy, every 3 weeks, by mouth x 6  cycles 03/02/22   Heath Lark, MD  ferrous sulfate 325 (65 FE) MG EC tablet Take 1 tablet (325 mg total) by mouth in the morning and at bedtime. Patient not taking: Reported on 02/23/2022 02/04/22   Bernadene Bell, MD  ibuprofen (ADVIL) 800 MG tablet Take 1 tablet (800 mg total) by mouth every 8 (eight) hours as needed for moderate pain. For AFTER surgery only 02/10/22   Joylene John D, NP  lidocaine-prilocaine (EMLA) cream Apply to affected area once 03/02/22   Heath Lark, MD  ondansetron (ZOFRAN) 8 MG tablet Take 1 tablet (8 mg total) by mouth every 8 (eight) hours as needed for nausea or vomiting. Start on the third day after chemotherapy. 03/02/22   Heath Lark, MD  oxyCODONE (OXY IR/ROXICODONE) 5 MG immediate release tablet Take 1 tablet (5 mg total) by mouth every 4 (four) hours as needed for severe pain. For AFTER surgery, do not take and drive, do not take with tramadol 02/11/22   Cross, Lenna Sciara D, NP  prochlorperazine (COMPAZINE) 10 MG tablet Take 1 tablet (10 mg total) by mouth every 6 (six) hours as needed for nausea or vomiting. 03/02/22   Heath Lark, MD  senna-docusate (SENOKOT-S) 8.6-50 MG tablet Take 2 tablets by mouth at bedtime. For AFTER surgery, do not take if having diarrhea 02/10/22   Dorothyann Gibbs, NP     Family History  Problem Relation Age of Onset   Colon cancer Father 15   Uterine cancer Sister 54  Melanoma Sister 34   Cancer Paternal Aunt        possible brain cancer, dx > 51   Breast cancer Neg Hx    Ovarian cancer Neg Hx    Endometrial cancer Neg Hx    Pancreatic cancer Neg Hx    Prostate cancer Neg Hx     Social History   Socioeconomic History   Marital status: Significant Other    Spouse name: Not on file   Number of children: Not on file   Years of education: Not on file   Highest education level: Not on file  Occupational History   Not on file  Tobacco Use   Smoking status: Never   Smokeless tobacco: Never  Vaping Use   Vaping Use: Never used   Substance and Sexual Activity   Alcohol use: Yes    Comment: every weekend   Drug use: No   Sexual activity: Yes    Birth control/protection: None  Other Topics Concern   Not on file  Social History Narrative   Not on file   Social Determinants of Health   Financial Resource Strain: Not on file  Food Insecurity: No Food Insecurity (02/10/2022)   Hunger Vital Sign    Worried About Running Out of Food in the Last Year: Never true    Ran Out of Food in the Last Year: Never true  Transportation Needs: No Transportation Needs (02/10/2022)   PRAPARE - Hydrologist (Medical): No    Lack of Transportation (Non-Medical): No  Physical Activity: Not on file  Stress: Not on file  Social Connections: Not on file    Code status: Full code  Review of Systems: A 12 point ROS discussed and pertinent positives are indicated in the HPI above.  All other systems are negative.  Review of Systems  Constitutional:  Negative for chills and fever.  Respiratory:  Negative for chest tightness and shortness of breath.   Cardiovascular:  Negative for chest pain and leg swelling.  Gastrointestinal:  Positive for abdominal pain and nausea. Negative for diarrhea and vomiting.  Neurological:  Positive for headaches. Negative for dizziness.  Psychiatric/Behavioral:  Negative for confusion.     Vital Signs: LMP 01/06/2022  T: 98.58F BP: 153/93 HR: 79 RR: 18    Physical Exam Vitals reviewed.  Constitutional:      General: She is not in acute distress.    Appearance: She is not ill-appearing.  Cardiovascular:     Rate and Rhythm: Normal rate and regular rhythm.     Pulses: Normal pulses.     Heart sounds: Normal heart sounds.  Pulmonary:     Effort: Pulmonary effort is normal.     Breath sounds: Normal breath sounds.  Abdominal:     General: Bowel sounds are normal.     Palpations: Abdomen is soft.     Tenderness: There is no abdominal tenderness.  Musculoskeletal:      Right lower leg: No edema.     Left lower leg: No edema.  Neurological:     Mental Status: She is alert and oriented to person, place, and time.  Psychiatric:        Mood and Affect: Mood normal.        Behavior: Behavior normal.        Thought Content: Thought content normal.        Judgment: Judgment normal.     Imaging: NM PET Image Initial (PI) Skull Base To  Thigh (F-18 FDG)  Result Date: 02/06/2022 CLINICAL DATA:  Initial treatment strategy for endometrial carcinoma and mediastinal adenopathy. EXAM: NUCLEAR MEDICINE PET SKULL BASE TO THIGH TECHNIQUE: 11.57 mCi F-18 FDG was injected intravenously. Full-ring PET imaging was performed from the skull base to thigh after the radiotracer. CT data was obtained and used for attenuation correction and anatomic localization. Fasting blood glucose: 169 mg/dl COMPARISON:  MR pelvis 01/24/2022, CT chest 01/23/2022, and CT AP 01/23/2022 FINDINGS: Mediastinal blood pool activity: SUV max 3.67 Liver activity: SUV max NA NECK: No hypermetabolic lymph nodes in the neck. Incidental CT findings: Bilateral chronic appearing mucosal thickening within the maxillary sinuses. CHEST: No tracer avid mediastinal, hilar, or axillary lymph nodes. No tracer avid pulmonary nodule. Bilateral patchy areas scarring, architectural distortion and fibrosis identified as noted on the previous chest CT from 01/23/2022. Incidental CT findings: None. ABDOMEN/PELVIS: -There is diffusely increased radiotracer uptake identified throughout the endometrium and cervix. SUV max is equal to 20.2. -aortocaval lymph node measures 1.3 cm with SUV max of 9.40, image 119/4. -right external iliac node measures 1.4 cm within SUV max of 11.94, image 151/4. Left external iliac lymph node measures scratch set -left external iliac lymph node measures 0.8 cm within SUV max of 3.29, image 154/4. No abnormal tracer uptake identified within the liver, pancreas, spleen, or adrenal glands. Status post  cholecystectomy. Incidental CT findings: Status post cholecystectomy. No ascites or focal fluid collections. No peritoneal nodularity identified. SKELETON: There is diffusely increased radiotracer uptake identified throughout the axial and appendicular skeleton. No focal areas of increased uptake above background bone marrow activity identified Incidental CT findings: No acute or suspicious bone lesions. IMPRESSION: 1. Diffusely increased uptake within the endometrium and cervix compatible with known endometrial carcinoma. 2. Tracer avid retroperitoneal and pelvic lymph nodes compatible with nodal metastasis. 3. No signs of solid organ metastasis or distant metastatic disease. 4. Nonspecific, diffuse increased bone marrow uptake is noted within the axial and appendicular skeleton. No focal areas of increased uptake to suggest osseous metastasis. 5. Multifocal bilateral areas of fibrosis, architectural distortion and scarring within both lungs which is favored to represent a inflammatory or infectious process. Findings may be the sequelae of prior atypical viral infection. Electronically Signed   By: Kerby Moors M.D.   On: 02/06/2022 10:41    Labs:  CBC: Recent Labs    01/12/22 2223 02/02/22 0951 02/11/22 0433 02/11/22 0846 02/11/22 2232 02/12/22 0414  WBC 13.4* 10.0 14.1*  --   --  9.9  HGB 8.4* 8.2* 7.3* 7.1* 7.8* 8.1*  HCT 29.8* 30.1* 25.7* 25.6* 27.6* 28.3*  PLT 796* 643* 629*  --   --  426*    COAGS: No results for input(s): "INR", "APTT" in the last 8760 hours.  BMP: Recent Labs    01/12/22 2223 02/02/22 0951 02/11/22 0433 02/12/22 0414  NA 132* 133* 132* 137  K 3.9 4.2 4.1 4.3  CL 98 102 101 101  CO2 '25 23 24 25  '$ GLUCOSE 171* 165* 168* 134*  BUN 5* '6 7 12  '$ CALCIUM 8.8* 8.7* 7.9* 8.0*  CREATININE 0.56 0.39* 0.48 0.45  GFRNONAA >60 >60 >60 >60    LIVER FUNCTION TESTS: Recent Labs    01/12/22 2223 02/02/22 0951  BILITOT 0.5 0.3  AST 42* 17  ALT 41 17  ALKPHOS 85  68  PROT 8.0 7.8  ALBUMIN 3.3* 3.4*    TUMOR MARKERS: No results for input(s): "AFPTM", "CEA", "CA199", "CHROMGRNA" in the last 8760 hours.  Assessment and Plan:  Maureen Campos is a 53 yo female being seen today for image-guided port placement. The patient was recently diagnosed with endometrial cancer and is s/p total hysterectomy with bilateral oophorectomy. The patient presents today in her usual state of health. Case has been reviewed with Dr Earleen Newport and is set to proceed on 03/05/22.  Risks and benefits of image guided port-a-catheter placement was discussed with the patient including, but not limited to bleeding, infection, pneumothorax, or fibrin sheath development and need for additional procedures.  All of the patient's questions were answered, patient is agreeable to proceed. Consent signed and in chart.   Thank you for this interesting consult.  I greatly enjoyed meeting [place] and look forward to participating in their care.  A copy of this report was sent to the requesting provider on this date.  Electronically Signed: Lura Em, PA-C 03/05/2022, 12:58 PM   I spent a total of  40 Minutes   in face to face in clinical consultation, greater than 50% of which was counseling/coordinating care for image-guided port placement.

## 2022-03-10 NOTE — Progress Notes (Signed)
Pharmacist Chemotherapy Monitoring - Initial Assessment    Anticipated start date: 03/17/22    The following has been reviewed per standard work regarding the patient's treatment regimen: The patient's diagnosis, treatment plan and drug doses, and organ/hematologic function Lab orders and baseline tests specific to treatment regimen  The treatment plan start date, drug sequencing, and pre-medications Prior authorization status  Patient's documented medication list, including drug-drug interaction screen and prescriptions for anti-emetics and supportive care specific to the treatment regimen The drug concentrations, fluid compatibility, administration routes, and timing of the medications to be used The patient's access for treatment and lifetime cumulative dose history, if applicable  The patient's medication allergies and previous infusion related reactions, if applicable   Changes made to treatment plan:  N/A  Follow up needed:  Patient is uninsured.   Raul Del [place], [place],  BCPS, BCOP 03/10/2022  3:14 PM

## 2022-03-16 ENCOUNTER — Telehealth: Payer: Self-pay | Admitting: Oncology

## 2022-03-16 ENCOUNTER — Inpatient Hospital Stay: Payer: Self-pay

## 2022-03-16 ENCOUNTER — Other Ambulatory Visit: Payer: Self-pay

## 2022-03-16 ENCOUNTER — Other Ambulatory Visit: Payer: Self-pay | Admitting: Hematology and Oncology

## 2022-03-16 ENCOUNTER — Telehealth: Payer: Self-pay | Admitting: Hematology and Oncology

## 2022-03-16 MED FILL — Dexamethasone Sodium Phosphate Inj 100 MG/10ML: INTRAMUSCULAR | Qty: 1 | Status: AC

## 2022-03-16 MED FILL — Fosaprepitant Dimeglumine For IV Infusion 150 MG (Base Eq): INTRAVENOUS | Qty: 5 | Status: AC

## 2022-03-16 NOTE — Telephone Encounter (Signed)
Called Maureen Campos regarding port flush/lab apt and patient education today.  She was unaware of appointments and can't bring [name] today due to her work.  Rescheduled appointments for tomorrow at 3:00 for port flush/lab and 4:00 patient education.  Also advised her of chemotherapy appointment on Friday, 03/20/22 at 8:00.  [name] verbalized agreement of appointments and gave her mother's cell phone number to call for appointments as well - [phone].

## 2022-03-16 NOTE — Telephone Encounter (Signed)
Called patient received no answer unable to leave message vm not set up

## 2022-03-16 NOTE — Telephone Encounter (Signed)
[place] and reviewed upcoming appointments with her.  She verbalized understanding and agreement. Advised I will give her a new calendar of appointments tomorrow.

## 2022-03-17 ENCOUNTER — Inpatient Hospital Stay: Payer: Self-pay

## 2022-03-17 ENCOUNTER — Other Ambulatory Visit: Payer: Self-pay

## 2022-03-17 ENCOUNTER — Inpatient Hospital Stay: Payer: Self-pay | Attending: Psychiatry

## 2022-03-17 ENCOUNTER — Other Ambulatory Visit: Payer: Self-pay | Admitting: Hematology and Oncology

## 2022-03-17 DIAGNOSIS — C541 Malignant neoplasm of endometrium: Secondary | ICD-10-CM | POA: Insufficient documentation

## 2022-03-17 DIAGNOSIS — Z5112 Encounter for antineoplastic immunotherapy: Secondary | ICD-10-CM | POA: Insufficient documentation

## 2022-03-17 DIAGNOSIS — D509 Iron deficiency anemia, unspecified: Secondary | ICD-10-CM

## 2022-03-17 DIAGNOSIS — Z5111 Encounter for antineoplastic chemotherapy: Secondary | ICD-10-CM | POA: Insufficient documentation

## 2022-03-17 LAB — CMP (CANCER CENTER ONLY)
ALT: 27 U/L (ref 0–44)
AST: 20 U/L (ref 15–41)
Albumin: 3.8 g/dL (ref 3.5–5.0)
Alkaline Phosphatase: 79 U/L (ref 38–126)
Anion gap: 8 (ref 5–15)
BUN: 12 mg/dL (ref 6–20)
CO2: 26 mmol/L (ref 22–32)
Calcium: 9.4 mg/dL (ref 8.9–10.3)
Chloride: 104 mmol/L (ref 98–111)
Creatinine: 0.5 mg/dL (ref 0.44–1.00)
GFR, Estimated: >60 mL/min (ref >60)
Glucose, Bld: 172 mg/dL — ABNORMAL HIGH (ref 70–99)
Potassium: 3.8 mmol/L (ref 3.5–5.1)
Sodium: 138 mmol/L (ref 135–145)
Total Bilirubin: 0.3 mg/dL (ref 0.3–1.2)
Total Protein: 8 g/dL (ref 6.5–8.1)

## 2022-03-17 LAB — CBC WITH DIFFERENTIAL (CANCER CENTER ONLY)
Abs Immature Granulocytes: 0.02 10*3/uL (ref 0.00–0.07)
Basophils Absolute: 0.1 10*3/uL (ref 0.0–0.1)
Basophils Relative: 1 %
Eosinophils Absolute: 0.4 10*3/uL (ref 0.0–0.5)
Eosinophils Relative: 5 %
HCT: 34.9 % — ABNORMAL LOW (ref 36.0–46.0)
Hemoglobin: 10.5 g/dL — ABNORMAL LOW (ref 12.0–15.0)
Immature Granulocytes: 0 %
Lymphocytes Relative: 31 %
Lymphs Abs: 2.8 10*3/uL (ref 0.7–4.0)
MCH: 21.9 pg — ABNORMAL LOW (ref 26.0–34.0)
MCHC: 30.1 g/dL (ref 30.0–36.0)
MCV: 72.9 fL — ABNORMAL LOW (ref 80.0–100.0)
Monocytes Absolute: 0.5 10*3/uL (ref 0.1–1.0)
Monocytes Relative: 6 %
Neutro Abs: 5.2 10*3/uL (ref 1.7–7.7)
Neutrophils Relative %: 57 %
Platelet Count: 375 10*3/uL (ref 150–400)
RBC: 4.79 MIL/uL (ref 3.87–5.11)
RDW: 23.2 % — ABNORMAL HIGH (ref 11.5–15.5)
WBC Count: 9 10*3/uL (ref 4.0–10.5)
nRBC: 0 % (ref 0.0–0.2)

## 2022-03-17 LAB — IRON AND IRON BINDING CAPACITY (CC-WL,HP ONLY)
Iron: 22 ug/dL — ABNORMAL LOW (ref 28–170)
Saturation Ratios: 5 % — ABNORMAL LOW (ref 10.4–31.8)
TIBC: 458 ug/dL — ABNORMAL HIGH (ref 250–450)
UIBC: 436 ug/dL (ref 148–442)

## 2022-03-17 MED ORDER — HEPARIN SOD (PORK) LOCK FLUSH 100 UNIT/ML IV SOLN
500.0000 [IU] | Freq: Once | INTRAVENOUS | Status: AC
  Administered 2022-03-17: 500 [IU]

## 2022-03-17 MED ORDER — SODIUM CHLORIDE 0.9% FLUSH
10.0000 mL | Freq: Once | INTRAVENOUS | Status: AC
  Administered 2022-03-17: 10 mL

## 2022-03-18 LAB — FERRITIN: Ferritin: 13 ng/mL (ref 11–307)

## 2022-03-18 LAB — TSH: TSH: 2.328 u[IU]/mL (ref 0.350–4.500)

## 2022-03-19 ENCOUNTER — Encounter: Payer: Self-pay | Admitting: Hematology and Oncology

## 2022-03-19 ENCOUNTER — Other Ambulatory Visit: Payer: Self-pay | Admitting: Hematology and Oncology

## 2022-03-19 LAB — T4: T4, Total: 8.4 ug/dL (ref 4.5–12.0)

## 2022-03-19 NOTE — Progress Notes (Signed)
Called pt to introduce myself as her Arboriculturist and to discuss the [employer].  I wasn't able to leave a msg on either number that's on file so I will try and speak to her when she comes for her next visit.

## 2022-03-20 ENCOUNTER — Inpatient Hospital Stay: Payer: Self-pay

## 2022-03-20 ENCOUNTER — Other Ambulatory Visit: Payer: Self-pay

## 2022-03-20 VITALS — BP 159/81 | HR 81 | Temp 98.2°F | Resp 18 | Wt 228.0 lb

## 2022-03-20 DIAGNOSIS — C541 Malignant neoplasm of endometrium: Secondary | ICD-10-CM

## 2022-03-20 MED ORDER — SODIUM CHLORIDE 0.9 % IV SOLN
175.0000 mg/m2 | Freq: Once | INTRAVENOUS | Status: AC
  Administered 2022-03-20: 372 mg via INTRAVENOUS
  Filled 2022-03-20: qty 62

## 2022-03-20 MED ORDER — FAMOTIDINE IN NACL 20-0.9 MG/50ML-% IV SOLN
20.0000 mg | Freq: Once | INTRAVENOUS | Status: AC
  Administered 2022-03-20: 20 mg via INTRAVENOUS
  Filled 2022-03-20: qty 50

## 2022-03-20 MED ORDER — SODIUM CHLORIDE 0.9 % IV SOLN
750.0000 mg | Freq: Once | INTRAVENOUS | Status: AC
  Administered 2022-03-20: 750 mg via INTRAVENOUS
  Filled 2022-03-20: qty 75

## 2022-03-20 MED ORDER — SODIUM CHLORIDE 0.9 % IV SOLN
510.0000 mg | Freq: Once | INTRAVENOUS | Status: AC
  Administered 2022-03-20: 510 mg via INTRAVENOUS
  Filled 2022-03-20: qty 17

## 2022-03-20 MED ORDER — SODIUM CHLORIDE 0.9 % IV SOLN
500.0000 mg | Freq: Once | INTRAVENOUS | Status: AC
  Administered 2022-03-20: 500 mg via INTRAVENOUS
  Filled 2022-03-20: qty 10

## 2022-03-20 MED ORDER — SODIUM CHLORIDE 0.9% FLUSH
10.0000 mL | INTRAVENOUS | Status: DC | PRN
  Administered 2022-03-20: 10 mL

## 2022-03-20 MED ORDER — SODIUM CHLORIDE 0.9 % IV SOLN
10.0000 mg | Freq: Once | INTRAVENOUS | Status: AC
  Administered 2022-03-20: 10 mg via INTRAVENOUS
  Filled 2022-03-20: qty 10

## 2022-03-20 MED ORDER — HEPARIN SOD (PORK) LOCK FLUSH 100 UNIT/ML IV SOLN
500.0000 [IU] | Freq: Once | INTRAVENOUS | Status: AC | PRN
  Administered 2022-03-20: 500 [IU]

## 2022-03-20 MED ORDER — SODIUM CHLORIDE 0.9 % IV SOLN: Freq: Once | INTRAVENOUS | Status: AC

## 2022-03-20 MED ORDER — SODIUM CHLORIDE 0.9 % IV SOLN
150.0000 mg | Freq: Once | INTRAVENOUS | Status: AC
  Administered 2022-03-20: 150 mg via INTRAVENOUS
  Filled 2022-03-20: qty 150

## 2022-03-20 MED ORDER — PALONOSETRON HCL INJECTION 0.25 MG/5ML
0.2500 mg | Freq: Once | INTRAVENOUS | Status: AC
  Administered 2022-03-20: 0.25 mg via INTRAVENOUS
  Filled 2022-03-20: qty 5

## 2022-03-20 MED ORDER — CETIRIZINE HCL 10 MG/ML IV SOLN
10.0000 mg | Freq: Once | INTRAVENOUS | Status: AC
  Administered 2022-03-20: 10 mg via INTRAVENOUS
  Filled 2022-03-20: qty 1

## 2022-03-20 NOTE — Patient Instructions (Signed)
Instrucciones al darle de alta: Discharge Instructions Gracias por elegir al [facility] de Cncer de [place] para brindarle atencin mdica de oncologa y Music therapist.   Si usted tiene una cita de laboratorio con [place], por favor vaya directamente [place] y regstrese en el rea de Control and instrumentation engineer.   Use ropa cmoda y [place] para tener fcil acceso a las vas del Portacath (acceso venoso de Engineer, site duracin) o la lnea PICC (catter central colocado por va perifrica).   Nos esforzamos por ofrecerle tiempo de calidad con su proveedor. Es posible que tenga que volver a programar su cita si llega tarde (15 minutos o ms).  El llegar tarde le afecta a usted y a otros pacientes cuyas citas son posteriores a Merchandiser, retail.  Adems, si usted falta a tres o ms citas sin avisar a la oficina, puede ser retirado(a) de la clnica a discrecin del proveedor.      Para las solicitudes de renovacin de recetas, pida a su farmacia que se ponga en contacto con nuestra oficina y deje que transcurran 58 horas para que se complete el proceso de las renovaciones.    Hoy usted recibi los siguientes agentes de quimioterapia e/o inmunoterapia Jemperli, Taxol, Carboplatin      Para ayudar a prevenir las nuseas y los vmitos despus de su tratamiento, le recomendamos que tome su medicamento para las nuseas segn las indicaciones.  LOS SNTOMAS QUE DEBEN COMUNICARSE INMEDIATAMENTE SE INDICAN A CONTINUACIN: *FIEBRE SUPERIOR A 100.4 F (38 C) O MS *ESCALOFROS O SUDORACIN *NUSEAS Y VMITOS QUE NO SE CONTROLAN CON EL MEDICAMENTO PARA LAS NUSEAS *DIFICULTAD INUSUAL PARA RESPIRAR  *MORETONES O HEMORRAGIAS NO HABITUALES *PROBLEMAS URINARIOS (dolor o ardor al Garment/textile technologist o frecuencia para Garment/textile technologist) *PROBLEMAS INTESTINALES (diarrea inusual, estreimiento, dolor cerca del ano) SENSIBILIDAD EN LA BOCA Y EN LA GARGANTA CON O SIN LA PRESENCIA DE LCERAS (dolor de garganta, llagas en la boca o dolor de  muelas/dientes) ERUPCIN, HINCHAZN O DOLORES INUSUALES FLUJO VAGINAL INUSUAL O PICAZN/RASQUIA    Los puntos marcados con un asterisco ( *) indican una posible emergencia y debe hacer un seguimiento tan pronto como le sea posible o vaya al Departamento de Emergencias si se le presenta algn problema.  Por favor, muestre la [place] DE ADVERTENCIA DE Windy Canny DE ADVERTENCIA DE Benay Spice al registrarse en [address] de Emergencias y a la enfermera de triaje.  Si tiene preguntas despus de su visita o necesita cancelar o volver a programar su cita, por favor pngase en contacto con [place]  Dept: [phone] y [place] instrucciones. Las horas de oficina son de 8:00 a.m. a 4:30 p.m. de lunes a viernes. Por favor, tenga en cuenta que los mensajes de voz que se dejan despus de las 4:00 p.m. posiblemente no se devolvern hasta el siguiente da de [place].  Cerramos los fines de semana y [employer]. En todo momento tiene acceso a una enfermera para preguntas urgentes. Por favor, llame al nmero principal de la clnica Dept: [phone] y [place] instrucciones.   Para cualquier pregunta que no sea de carcter urgente, tambin puede ponerse en contacto con su proveedor [employer]. Ahora ofrecemos visitas electrnicas para cualquier persona mayor de 18 aos que solicite atencin mdica en lnea para los sntomas que no sean urgentes. Para ms detalles vaya a [email or web address].   Tambin puede bajar la aplicacin de MyChart! Vaya a la tienda de aplicaciones, busque "MyChart",  abra la aplicacin, seleccione [place], e ingrese con su nombre de usuario y la contrasea de Pharmacist, community.  Dostarlimab Injection Qu es este medicamento? El DOSTARLIMAB trata algunos tipos de cncer. Acta Linwood Dibbles al sistema inmunolgico a desacelerar o detener la propagacin de las clulas cancerosas. Es un anticuerpo  monoclonal. Cindra Presume medicamento puede ser utilizado para otros usos; si tiene alguna pregunta consulte con su proveedor de atencin mdica o con su farmacutico. MARCAS COMUNES: Jemperli Qu le debo informar a mi profesional de la salud antes de tomar este medicamento? Necesitan saber si usted presenta alguno de los siguientes problemas o situaciones: Trasplante alognico de Social research officer, government (se usan las clulas madre de otra persona) Enfermedades autoinmunes, tales como enfermedad de Crohn, colitis ulcerativa, lupus Antecedentes de radiacin en el pecho Problemas del sistema nervioso, tales como sndrome de [place], [place] grave Trasplante de rganos Furniture conservator/restorer reaccin alrgica o inusual al dostarlimab, a otros medicamentos, alimentos, colorantes o conservantes Si est embarazada o buscando quedar embarazada Si est amamantando a un beb Cmo debo [employer]? Este medicamento se inyecta en una vena. Su equipo de atencin lo [place] en un hospital o en un entorno clnico. Se le entregar una Gua del medicamento (MedGuide, su nombre en ingls) especial antes de cada tratamiento. Asegrese de leer esta informacin cada vez cuidadosamente. Hable con su equipo de atencin sobre el uso de este medicamento en nios. Puede requerir atencin especial. Sobredosis: Pngase en contacto inmediatamente con un centro toxicolgico o una sala de urgencia si usted cree que haya tomado demasiado medicamento. ATENCIN: [employer] es solo para usted. No comparta este medicamento con nadie. Qu sucede si me olvido de una dosis? Cumpla con las citas para dosis de seguimiento. Es importante no olvidar ninguna dosis. Llame a su equipo de atencin si no puede asistir a una cita. Qu puede interactuar con este medicamento? No se han estudiado las interacciones. Puede ser que esta lista no menciona todas las posibles interacciones. Informe a su profesional de KB Home	[place] de [employer] productos a  base de hierbas, medicamentos de [place] o suplementos nutritivos que est tomando. Si usted fuma, consume bebidas alcohlicas o si utiliza drogas ilegales, indqueselo tambin a su profesional de KB Home	[place]. Algunas sustancias pueden interactuar con su medicamento. A qu debo estar atento al usar Coca-Cola? Se supervisar su estado de salud atentamente mientras reciba este medicamento. Usted podra necesitar realizarse [employer]. [employer]wide de sangre mientras est usando [place]. Este medicamento podra causar reacciones graves en la piel. Pueden presentarse semanas a meses despus de comenzar a Environmental consultant. Contacte a su equipo de atencin de inmediato si nota que tiene fiebre o sntomas gripales con una erupcin. La erupcin puede ser roja o Phill Myron, y luego puede convertirse en ampollas o descamacin de la piel. Tambin podra observar una erupcin roja con hinchazn en la cara, los labios o los ganglios linfticos en el cuello o debajo de los brazos. Si observa algn cambio en la vista, infrmelo de inmediato a su equipo de atencin. Hable con su equipo de atencin si podra estar embarazada. Este medicamento puede causar defectos congnitos graves si se [place] durante el embarazo y por 4 meses despus de la ltima dosis. Deber realizarse una prueba de embarazo y obtener resultado negativo antes de Medical laboratory scientific officer a Solicitor. Se recomienda utilizar un mtodo anticonceptivo mientras est usando este medicamento y por 4 meses despus de la ltima dosis. Su equipo de atencin mdica puede ayudarle a Animator opcin que  mejor se adapte a sus necesidades. No debe amamantar a un beb mientras [place] este medicamento y por 4 meses despus de la ltima dosis. Qu efectos secundarios puedo tener al [employer] este medicamento? Efectos secundarios que debe informar a su equipo de atencin tan pronto como sea posible: Reacciones alrgicas: erupcin cutnea, comezn/picazn, urticaria, hinchazn  de la cara, los labios, la lengua o la garganta Tos seca, falta de aire o problemas para Contractor, enrojecimiento, irritacin o secrecin de los ojos con visin borrosa o disminuida Inflamacin del msculo cardiaco: debilidad o fatiga inusuales, falta de aire, dolor en el pecho, frecuencia cardaca rpida o irregular, mareos, hinchazn de los tobillos, los pies o las manos. Problemas de las glndulas hormonales: dolor de [place], sensibilidad a la luz, debilidad o fatiga inusuales, mareos, ritmo cardaco rpido o irregular, aumento de la sensibilidad al fro o al calor, sudoracin excesiva, estreimiento, cada del cabello, aumento de la sed o de la cantidad de [place], temblores o sacudidas, irritabilidad Reacciones a la infusin: Tourist information centre manager, falta de aire o dificultad para respirar, sensacin de desmayo o aturdimiento Lesin renal (glomerulonefritis): disminucin de la cantidad de [place], [place] roja o marrn oscura, orina con espuma o burbujas, hinchazn de los tobillos, las manos o los pies Lesin en el hgado: Social research officer, government en la regin abdominal superior derecha, prdida de apetito, nuseas, heces de color claro, orina amarilla oscura o marrn, color amarillento de los ojos o la piel, debilidad o Copy, hormigueo o entumecimiento en las manos o los pies, debilidad muscular, cambios en la visin, confusin o dificultad para hablar, prdida del equilibrio o la coordinacin, dificultad para caminar, convulsiones Erupcin, fiebre y ganglios linfticos inflamados Enrojecimiento, formacin de ampollas, descamacin o distensin de la piel, incluso dentro de la boca Dolor de Paramedic repentino o intenso, diarrea con [place], fiebre, nuseas, vmitos Efectos secundarios que generalmente no requieren atencin mdica (debe informarlos a su equipo de atencin si persisten o si son molestos): [employer], las articulaciones o los msculos Diarrea Fatiga Prdida del  apetito Nuseas Erupcin cutnea Puede ser que esta lista no menciona todos los posibles efectos secundarios. Comunquese a su mdico por asesoramiento mdico [employer]. Usted puede informar los efectos secundarios a la FDA por telfono al 1-800-FDA-1088. Dnde debo guardar mi medicina? Este medicamento se administra en hospitales o clnicas. No se guarda en su casa. ATENCIN: Este folleto es un resumen. Puede ser que no cubra toda la posible informacin. Si usted tiene preguntas acerca de esta medicina, consulte con su mdico, su farmacutico o su profesional de Technical sales engineer.  2023 Elsevier/Gold Standard (2021-11-12 00:00:00)  Paclitaxel Injection Qu es este medicamento? El PACLITAXEL trata algunos tipos de cncer. Acta desacelerando el crecimiento de clulas cancerosas. Este medicamento puede ser utilizado para otros usos; si tiene alguna pregunta consulte con su proveedor de atencin mdica o con su farmacutico. MARCAS COMUNES: Onxol, Taxol Qu le debo informar a mi profesional de la salud antes de tomar este medicamento? Necesitan saber si usted presenta alguno de los siguientes problemas o situaciones: Enfermedad cardiaca Enfermedad heptica Niveles bajos de glbulos blancos Una reaccin alrgica o inusual al paclitaxel, a otros medicamentos, alimentos, colorantes o conservantes Si usted o su pareja est embarazada o intentando quedar embarazada Si est amamantando a un beb Cmo debo Insurance account manager medicamento? Este medicamento se inyecta en una vena. Su equipo de atencin lo [place] en un hospital o en un entorno clnico. Hable con su equipo de atencin  sobre el uso de este medicamento en nios. Aunque este medicamento se puede administrar a nios con ciertas afecciones, existen precauciones que deben tomarse. Sobredosis: Pngase en contacto inmediatamente con un centro toxicolgico o una sala de urgencia si usted cree que haya tomado demasiado  medicamento. ATENCIN: [employer] es solo para usted. No comparta este medicamento con nadie. Qu sucede si me olvido de una dosis? Cumpla con las citas para dosis de seguimiento. Es importante no olvidar ninguna dosis. Llame a su equipo de atencin si no puede asistir a una cita. Qu puede interactuar con este medicamento? No use este medicamento con ninguno de los siguientes productos: Vacunas de virus vivos Otros medicamentos podran afectar la forma en que funciona este medicamento. Hable con su equipo de atencin sobre todos los medicamentos que [place]. Es posible que Lobbyist en su plan de tratamiento para reducir el riesgo de efectos secundarios y asegurarse de que los medicamentos actan del modo previsto. Puede ser que esta lista no menciona todas las posibles interacciones. Informe a su profesional de KB Home	[place] de [employer] Corporation productos a base de hierbas, medicamentos de [place] o suplementos nutritivos que est tomando. Si usted fuma, consume bebidas alcohlicas o si utiliza drogas ilegales, indqueselo tambin a su profesional de KB Home	[place]. Algunas sustancias pueden interactuar con su medicamento. A qu debo estar atento al usar Coca-Cola? Se supervisar su estado de salud atentamente mientras reciba este medicamento. Usted podra necesitar realizarse [employer]. [employer]wide de sangre mientras est usando [place]. Este medicamento podra hacerle sentir un Nurse, mental health. Esto no es inusual, ya que la quimioterapia puede afectar tanto a las clulas sanas como a las clulas cancerosas. Si presenta algn efecto secundario, infrmelo. Contine con el tratamiento incluso si se siente enfermo, a menos que su equipo de [employer] lo suspenda. Este medicamento puede causar Chief of Staff graves. Para reducir Catering manager, su equipo de atencin puede darle otros medicamentos que deber usar antes de Health and safety inspector. Asegrese de seguir las instrucciones de su equipo de  atencin. Este medicamento puede aumentar su riesgo de contraer una infeccin. Llame para pedir consejo a su equipo de atencin si tiene fiebre, escalofros, dolor de garganta o cualquier otro sntoma de resfriado o gripe. No se trate usted mismo. Trate de no acercarse a personas que estn enfermas. Este medicamento podra aumentar el riesgo de moretones o sangrado. Llame a su equipo de atencin si observa sangrados inusuales. Proceda con cuidado al cepillar sus dientes, usar hilo dental o Risk manager palillos para los dientes, ya que podra contraer una infeccin o Therapist, art con mayor facilidad. Si recibe algn tratamiento dental, informe a su dentista que est [employer]. Hable con su equipo de atencin si podra estar embarazada. Este medicamento puede causar defectos congnitos graves si se [place] durante el [place]. Hable con su equipo de atencin antes de amamantar. Es posible que sea Chartered loss adjuster cambios en su plan de tratamiento. Qu efectos secundarios puedo tener al [employer] este medicamento? Efectos secundarios que debe informar a su equipo de atencin tan pronto como sea posible: Reacciones alrgicas: erupcin cutnea, comezn/picazn, urticaria, hinchazn de la cara, los labios, la lengua o la garganta Cambios en el ritmo cardiaco: frecuencia cardiaca rpida o irregular, mareos, sensacin de desmayo o aturdimiento, dolor en el pecho, dificultad para respirar Aumento de la presin arterial Infeccin: fiebre, escalofros, tos, dolor de garganta, heridas que no sanan, dolor o problemas para orinar, sensacin general de [place] Presin arterial baja: mareo,  sensacin de desmayo o aturdimiento, visin borrosa Recuento bajo de glbulos rojos: debilidad o fatiga inusuales, mareo, dolor de cabeza, dificultad para respirar Hinchazn dolorosa, calor o enrojecimiento de la piel, ampollas o llagas en el sitio de la infusin Dolor, hormigueo o entumecimiento de las manos o los  pies Frecuencia cardiaca lenta: mareos, sensacin de desmayo o aturdimiento, confusin, problemas para respirar, debilidad o fatiga inusuales Sangrado o moretones inusuales Efectos secundarios que generalmente no requieren atencin mdica (debe informarlos a su equipo de atencin si persisten o si son molestos): Diarrea Cada del Programmer, multimedia en las articulaciones Prdida del apetito Dolor muscular Nuseas Vmito Puede ser que esta lista no menciona todos los posibles efectos secundarios. Comunquese a su mdico por asesoramiento mdico [employer]. Usted puede informar los efectos secundarios a la FDA por telfono al 1-800-FDA-1088. Dnde debo guardar mi medicina? Este medicamento se administra en hospitales o clnicas. No se guarda en su casa. ATENCIN: Este folleto es un resumen. Puede ser que no cubra toda la posible informacin. Si usted tiene preguntas acerca de esta medicina, consulte con su mdico, su farmacutico o su profesional de Technical sales engineer.  2023 Elsevier/Gold Standard (2021-11-12 00:00:00)  Carboplatin Injection Qu es este medicamento? El CARBOPLATINO trata algunos tipos de cncer. Acta desacelerando el crecimiento de clulas cancerosas. Este medicamento puede ser utilizado para otros usos; si tiene alguna pregunta consulte con su proveedor de atencin mdica o con su farmacutico. MARCAS COMUNES: Paraplatin Qu le debo informar a mi profesional de la salud antes de tomar este medicamento? Necesitan saber si usted presenta alguno de los siguientes problemas o situaciones: Trastornos sanguneos Problemas auditivos Enfermedad renal Terapia de radiacin en curso o reciente Una reaccin alrgica o inusual al carboplatino, al cisplatino, a otros medicamentos, alimentos, colorantes o conservantes Si est embarazada o buscando quedar embarazada Si est amamantando a un beb Cmo debo Insurance account manager medicamento? Este medicamento se inyecta en una vena. Su  equipo de atencin lo [employer] en un hospital o en un entorno clnico. Hable con su equipo de atencin sobre el uso de este medicamento en nios. Puede requerir atencin especial. Sobredosis: Pngase en contacto inmediatamente con un centro toxicolgico o una sala de urgencia si usted cree que haya tomado demasiado medicamento. ATENCIN: [employer] es solo para usted. No comparta este medicamento con nadie. Qu sucede si me olvido de una dosis? Cumpla con las citas para dosis de seguimiento. Es importante no olvidar ninguna dosis. Llame a su equipo de atencin si no puede asistir a una cita. Qu puede interactuar con este medicamento? Medicamentos para convulsiones Algunos antibiticos, tales como [place], gentamicina, neomicina, estreptomicina, tobramicina Clinical biochemist Puede ser que esta lista no menciona todas las posibles interacciones. Informe a su profesional de KB Home	[place] de [employer] Corporation productos a base de hierbas, medicamentos de [place] o suplementos nutritivos que est tomando. Si usted fuma, consume bebidas alcohlicas o si utiliza drogas ilegales, indqueselo tambin a su profesional de KB Home	[place]. Algunas sustancias pueden interactuar con su medicamento. A qu debo estar atento al usar Coca-Cola? Se supervisar su estado de salud atentamente mientras reciba este medicamento. Usted podra necesitar realizarse [employer]. [employer]wide de sangre mientras est usando [place]. Este medicamento podra hacerle sentir un Nurse, mental health. Esto es normal, ya que la quimioterapia puede afectar tanto a las clulas sanas como a las clulas cancerosas. Si presenta algn efecto secundario, infrmelo. Contine con el tratamiento incluso si se siente enfermo, a menos que su equipo de [employer]  lo suspenda. En algunos casos, podra recibir [employer] para ayudarle con los efectos secundarios. Siga todas las instrucciones para usarlos. Este medicamento puede aumentar su  riesgo de contraer una infeccin. Llame para pedir consejo a su equipo de atencin si tiene fiebre, escalofros, dolor de garganta o cualquier otro sntoma de resfriado o gripe. No se trate usted mismo. Trate de no acercarse a personas que estn enfermas. Evite usar medicamentos que contengan aspirina, acetaminofeno, ibuprofeno, naproxeno o ketoprofeno, a menos que as lo indique su equipo de atencin. Estos medicamentos pueden ocultar la fiebre. Proceda con cuidado al cepillar sus dientes, usar hilo dental o Risk manager palillos para los dientes, ya que podra contraer una infeccin o Therapist, art con mayor facilidad. Si recibe algn tratamiento dental, informe a su dentista que est [employer]. Informe a su equipo de atencin si est buscando un embarazo o si cree que podra estar embarazada. Este medicamento puede causar defectos congnitos graves. Hable con su equipo de atencin sobre mtodos anticonceptivos eficaces. No debe amamantar a un beb mientras est [employer]. Qu efectos secundarios puedo tener al [employer] este medicamento? Efectos secundarios que debe informar a su equipo de atencin tan pronto como sea posible: Reacciones alrgicas: erupcin cutnea, comezn/picazn, urticaria, hinchazn de la cara, los labios, la lengua o la garganta Infeccin: fiebre, escalofros, tos, dolor de garganta, heridas que no sanan, dolor o problemas para Garment/textile technologist, sensacin general de molestia o malestar Recuento bajo de glbulos rojos: debilidad o fatiga inusuales, mareo, dolor de cabeza, dificultad para Contractor, hormigueo o entumecimiento en las manos o los pies, debilidad muscular, cambios en la visin, confusin o dificultad para hablar, prdida del equilibrio o la coordinacin, dificultad para caminar, convulsiones Sangrado o moretones inusuales Efectos secundarios que generalmente no requieren atencin mdica (debe informarlos a su equipo de atencin si persisten o si son  molestos): Cada del cabello Nuseas Debilidad o fatiga inusuales Vmito Puede ser que esta lista no menciona todos los posibles efectos secundarios. Comunquese a su mdico por asesoramiento mdico [employer]. Usted puede informar los efectos secundarios a la FDA por telfono al 1-800-FDA-1088. Dnde debo guardar mi medicina? Este medicamento se administra en hospitales o clnicas. No se guarda en su casa. ATENCIN: Este folleto es un resumen. Puede ser que no cubra toda la posible informacin. Si usted tiene preguntas acerca de esta medicina, consulte con su mdico, su farmacutico o su profesional de Technical sales engineer.  2023 Elsevier/Gold Standard (2021-11-12 00:00:00)

## 2022-03-23 ENCOUNTER — Telehealth: Payer: Self-pay | Admitting: *Deleted

## 2022-03-23 NOTE — Telephone Encounter (Signed)
-----   Message from Rolene Course, RN sent at 03/20/2022  3:58 PM EDT ----- Regarding: Alvy Bimler 1st Tx F/U call - Jemperli, Taxol, Carbo Gorsuch 1st Tx F/U call - Jemperli, Taxol, Carbo.  Patient also received venofer.  Tolerated infusion well.

## 2022-03-23 NOTE — Telephone Encounter (Signed)
Called pt to discuss how she did with her recent treatment.  She reports doing well after treatment but today she went out with family for lunch & walk & her legs & body hurts.  She also reported a h/a.  She took some tylenol which didn't help much & also took oxycodone which didn't help much. She thinks it is from walking & just wants to rest some.  She reports eating & drinking fluids OK. She reported feeling cold but no fever.  Encouraged to call back if she was worse.  She states that she doesn't think she needs to be seen.  Message routed to Dr Marlinda Mike RN.

## 2022-03-31 ENCOUNTER — Telehealth: Payer: Self-pay

## 2022-03-31 DIAGNOSIS — R300 Dysuria: Secondary | ICD-10-CM

## 2022-03-31 NOTE — Progress Notes (Unsigned)
Symptom Management Consult Note [place]    Patient Care Team: Patient, No Pcp Per as PCP - General (General Practice)    Name / MRN / DOB: Maureen Campos  [id number]  16-May-1970   Date of visit: 04/01/2022   Chief Complaint/Reason for visit: dysuria   Current Therapy: paraplatin, jemperli, taxol  Last treatment:  Day 1   Cycle 1 on 03/20/22   ASSESSMENT & PLAN: Patient is a 52 y.o. female  with oncologic history of stage IIIC1 endometrial cancer followed by Dr. Alvy Bimler.  I have viewed most recent oncology note and lab work.    #stage IIIC1 endometrial cancer - Next appointment with oncologist is 04/10/22   #       Heme/Onc History: Oncology History Overview Note  Endometrioid high grade dMMR abnormal   Endometrial cancer ([place])  01/13/2022 Imaging   CT Abdomen/Pelvis: IMPRESSION: Heterogenous cystic mass centered in the cervix concerning for neoplasm. Endometrial thickening possibly due to obstruction from the cystic mass. Direct visualization is recommended.   Enlarged right para-aortic and right external iliac nodes are indeterminate but concerning for metastasis given cervical lesion.   Borderline enlargement and fecalization of the distal ileum. No definite transition point. Findings suggestive of slow transit/constipation.   01/14/2022 Initial Biopsy   Endometrial biopsy and ECC: A. ENDOCERVIX, CURETTAGE: - BENIGN ENDOCERVICAL MUCOSA WITH ACUTE AND CHRONIC INFLAMMATION - NEGATIVE FOR MALIGNANCY  B. 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  The tumor cells are partially positive for p16. The p53 stain shows foci with wild-type staining and foci with overexpression.  The tumor cells show patchy positivity for ER and PR and are negative for Napsin A.  Overall,  the morphologic and immunohistochemical findings are consistent with a endometrial endometrioid carcinoma with clear cell change (FIGO grade 2).  Drs. Doreene Eland reviewed the case and agree with the above diagnosis.  Clinical correlation recommended.    01/14/2022 Initial Diagnosis   Endometrial cancer ([place])   01/24/2022 Imaging   MR pelvis 1. There is an enhancing mass identified within the lower uterine segment which measures 4.3 x 3.9 by 4.7 cm. This invades the anterior myometrium within the lower uterine segment. This appears to terminate at the level of the internal os of the cervix. Findings are compatible with endometrial carcinoma. 2. Aortocaval, right common iliac, and bilateral pelvic sidewall lymph nodes are enlarged compatible with metastatic adenopathy. Given the presence of periaortic and pelvic node involvement imaging findings are compatible with stage IIIC2 disease.   02/06/2022 PET scan   1. Diffusely increased uptake within the endometrium and cervix compatible with known endometrial carcinoma. 2. Tracer avid retroperitoneal and pelvic lymph nodes compatible with nodal metastasis. 3. No signs of solid organ metastasis or distant metastatic disease. 4. Nonspecific, diffuse increased bone marrow uptake is noted within the axial and appendicular skeleton. No focal areas of increased uptake to suggest osseous metastasis. 5. Multifocal bilateral areas of fibrosis, architectural distortion and scarring within both lungs which is favored to represent a inflammatory or infectious process. Findings may be the sequelae of prior atypical viral infection.   02/10/2022 Pathology Results   A. RIGHT AORTOCAVAL LYMPH NODE, EXCISION: Two benign lymph nodes, negative for carcinoma (0/2)  B. RIGHT PELVIC LYMPH NODE, EXCISION: Two of two lymph nodes with metastatic adenocarcinoma (2/2)  C. LEFT OBTURATOR PELVIC LYMPH NODE, EXCISION: Three lymph nodes, negative for carcinoma  (0/3)  D. 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 RIGHT POSTERIOR CERVICAL MARGIN, EXCISION: Benign cervical stroma Negative for carcinoma  ONCOLOGY TABLE:  UTERUS, CARCINOMA OR CARCINOSARCOMA: Resection  Procedure: Total hysterectomy and bilateral salpingo-oophorectomy with node sampling Histologic Type: Endometrioid adenocarcinoma Histologic Grade: High-grade, FIGO 3 Myometrial Invasion:      Depth of Myometrial Invasion (mm): 10 mm      Myometrial Thickness (mm): 33 mm      Percentage of Myometrial Invasion: 30% Uterine Serosa Involvement: Not identified Cervical stromal Involvement: Not identified Extent of involvement of other tissue/organs: Not identified Peritoneal/Ascitic Fluid: []  Lymphovascular Invasion: Present Regional Lymph Nodes:      Pelvic Lymph Nodes Examined: 5 nonsentinel lymph nodes      Pelvic Lymph Nodes with Metastasis: 2          Macrometastasis: (>2.0 mm): 2          Micrometastasis: (>0.2 mm and < 2.0 mm): 0          Isolated Tumor Cells (<0.2 mm): 0          Laterality of Lymph Node with Tumor: Right          Extracapsular Extension: Not identified      Para-aortic Lymph Nodes Examined: 2 nonsentinel lymph nodes       Para-aortic Lymph Nodes with Metastasis: 0          Macrometastasis: (>2.0 mm): 0          Micrometastasis: (>0.2 mm and < 2.0 mm): 0           Isolated Tumor Cells (<0.2 mm): 0 Distant Metastasis: Not applicable Pathologic Stage Classification (pTNM, AJCC 8th Edition): pT1a, pN1a  Ancillary Studies: MMR testing has been ordered and the results will be issued within an addendum to this report.    02/10/2022 Surgery   Preoperative Diagnosis: Endometrioid endometrial cancer FIGO grade 2, Morbid obesity (BMI 44)    Procedures: Robotic-assisted total laparoscopic hysterectomy, bilateral salpingo-oophorectomy, bilateral sentinel lymph node injection and debulking of enlarge, PET avid pelvic and para-aortic lymph nodes (Modifer 22: extreme morbid obesity, BMI 44, with significant retroperitoneal and intraperitoneal adiposity requiring additional OR personnel for positioning and retraction. Obesity made retroperitoneal visualization limited, increasing the complexity of the case and necessitating additional instrumentation for retraction and to create safe exposure. Obesity related complexity increased the duration of the procedure by 60 minutes.)   Surgeon: Bernadene Bell, MD    Findings: Normal upper abdominal survey with normal liver surface and diaphragm. Normal appearing small and large bowel. Globally enlarged uterus. Overall normal tubes, and ovaries, small paratubal cyst. No evidence of peritoneal disease, ascites, or carcinomatosis. Enlarged bilateral pelvic and right para-aortic lymph nodes removed. Tumor spill into the vagina and pelvis noted with colpotomy. Pelvis and vagina copiously irrigated. Significant intra-abdominal adiposity.   02/23/2022 Cancer Staging   Staging form: Corpus Uteri - Carcinoma and Carcinosarcoma, AJCC 8th Edition - Pathologic  stage from 02/23/2022: Stage IIIC1 (pT1a, pN1, cM0) - Signed by Heath Lark, MD on 02/23/2022 Stage prefix: Initial diagnosis   03/05/2022 Procedure   Status post right IJ port catheter placement.    03/20/2022 -  Chemotherapy   Patient is on Treatment Plan : UTERINE ENDOMETRIAL  Dostarlimab-gxly (500 mg) + Carboplatin (AUC 5) + Paclitaxel (175 mg/m2) q21d x 6 cycles / Dostarlimab-gxly (1000 mg) q42d x 6 cycles          Interval history-: Chester Floriano is a 52 y.o. female with oncologic history as above presenting to [facility] today with chief complaint of      ROS  All other systems are reviewed and are negative for acute change except as noted in the HPI.    Allergies  Allergen Reactions   Advil [Ibuprofen] Itching and Rash     Past Medical History:  Diagnosis Date   Dyspnea    with activity   Endometrial cancer ([place])    Family history of colon cancer 03/04/2022   Family history of uterine cancer    Fast heart beat    with activity   Fatigue    Hearing loss    Left ear   History of kidney stones    12 years ago   Lazy eye, left    Left leg numbness    occ   Migraine    occ     Past Surgical History:  Procedure Laterality Date   CHOLECYSTECTOMY  2011   IR IMAGING GUIDED PORT INSERTION  03/05/2022   ROBOTIC ASSISTED TOTAL HYSTERECTOMY WITH BILATERAL SALPINGO OOPHERECTOMY N/A 02/10/2022   Procedure: XI ROBOTIC ASSISTED TOTAL HYSTERECTOMY WITH BILATERAL SALPINGO OOPHORECTOMY;  Surgeon: Bernadene Bell, MD;  Location: [facility];  Service: Gynecology;  Laterality: N/A;   ROBOTIC PELVIC AND PARA-AORTIC LYMPH NODE DISSECTION N/A 02/10/2022   Procedure: XI ROBOTIC PELVIC AND PARA-AORTIC SENTINEL LYMPH NODE DISSECTION;  Surgeon: Bernadene Bell, MD;  Location: [facility];  Service: Gynecology;  Laterality: N/A;    Social History   Socioeconomic History   Marital status: Significant Other    Spouse name: Not on file   Number of children: Not on file   Years of education: Not on file   Highest education level: Not on file  Occupational History   Not on file  Tobacco Use   Smoking status: Never   Smokeless tobacco: Never  Vaping Use   Vaping Use: Never used  Substance and Sexual Activity   Alcohol use: Yes    Comment: every weekend   Drug use:  No   Sexual activity: Yes    Birth control/protection: None  Other Topics Concern   Not on file  Social History Narrative   Not on file   Social Determinants of Health   Financial Resource Strain: Not on file  Food Insecurity: No Food Insecurity (02/10/2022)   Hunger Vital Sign    Worried About Running Out of Food in the Last Year: Never true    Ran Out of Food in the Last Year: Never true  Transportation Needs: No Transportation Needs (02/10/2022)   PRAPARE - Hydrologist (Medical): No    Lack of Transportation (Non-Medical): No  Physical Activity: Not on file  Stress: Not on file  Social Connections: Not on file  Intimate Partner Violence: Not At Risk (02/10/2022)   Humiliation, Afraid, Rape, and Kick questionnaire    Fear of Current or Ex-Partner: No    Emotionally Abused: No  Physically Abused: No    Sexually Abused: No    Family History  Problem Relation Age of Onset   Colon cancer Father 52   Uterine cancer Sister 66   Melanoma Sister 38   Cancer Paternal Aunt        possible brain cancer, dx > 82   Breast cancer Neg Hx    Ovarian cancer Neg Hx    Endometrial cancer Neg Hx    Pancreatic cancer Neg Hx    Prostate cancer Neg Hx      Current Outpatient Medications:    dexamethasone (DECADRON) 4 MG tablet, Take 2 tabs at the night before and 2 tab the morning of chemotherapy, every 3 weeks, by mouth x 6 cycles, Disp: 24 tablet, Rfl: 6   ferrous sulfate 325 (65 FE) MG EC tablet, Take 1 tablet (325 mg total) by mouth in the morning and at bedtime. (Patient not taking: Reported on 02/23/2022), Disp: 60 tablet, Rfl: 3   ibuprofen (ADVIL) 800 MG tablet, Take 1 tablet (800 mg total) by mouth every 8 (eight) hours as needed for moderate pain. For AFTER surgery only (Patient not taking: Reported on 03/20/2022), Disp: 30 tablet, Rfl: 0   lidocaine-prilocaine (EMLA) cream, Apply to affected area once, Disp: 30 g, Rfl: 3   ondansetron (ZOFRAN) 8 MG  tablet, Take 1 tablet (8 mg total) by mouth every 8 (eight) hours as needed for nausea or vomiting. Start on the third day after chemotherapy., Disp: 30 tablet, Rfl: 1   oxyCODONE (OXY IR/ROXICODONE) 5 MG immediate release tablet, Take 1 tablet (5 mg total) by mouth every 4 (four) hours as needed for severe pain. For AFTER surgery, do not take and drive, do not take with tramadol, Disp: 15 tablet, Rfl: 0   prochlorperazine (COMPAZINE) 10 MG tablet, Take 1 tablet (10 mg total) by mouth every 6 (six) hours as needed for nausea or vomiting., Disp: 30 tablet, Rfl: 1   senna-docusate (SENOKOT-S) 8.6-50 MG tablet, Take 2 tablets by mouth at bedtime. For AFTER surgery, do not take if having diarrhea, Disp: 30 tablet, Rfl: 0  PHYSICAL EXAM: ECOG FS:{CHL ONC [id number]   There were no vitals filed for this visit. Physical Exam     LABORATORY DATA: I have reviewed the data as listed    Latest Ref Rng & Units 03/17/2022    3:04 PM 02/12/2022    4:14 AM 02/11/2022   10:32 PM  CBC  WBC 4.0 - 10.5 K/uL 9.0  9.9    Hemoglobin 12.0 - 15.0 g/dL 10.5  8.1  7.8   Hematocrit 36.0 - 46.0 % 34.9  28.3  27.6   Platelets 150 - 400 K/uL 375  426          Latest Ref Rng & Units 03/17/2022    3:04 PM 02/12/2022    4:14 AM 02/11/2022    4:33 AM  CMP  Glucose 70 - 99 mg/dL 172  134  168   BUN 6 - 20 mg/dL 12  12  7    Creatinine 0.44 - 1.00 mg/dL 0.50  0.45  0.48   Sodium 135 - 145 mmol/L 138  137  132   Potassium 3.5 - 5.1 mmol/L 3.8  4.3  4.1   Chloride 98 - 111 mmol/L 104  101  101   CO2 22 - 32 mmol/L 26  25  24    Calcium 8.9 - 10.3 mg/dL 9.4  8.0  7.9   Total Protein 6.5 -  8.1 g/dL 8.0     Total Bilirubin 0.3 - 1.2 mg/dL 0.3     Alkaline Phos 38 - 126 U/L 79     AST 15 - 41 U/L 20     ALT 0 - 44 U/L 27          RADIOGRAPHIC STUDIES (from last 24 hours if applicable) I have personally reviewed the radiological images as listed and agreed with the findings in the report. No results found.       Visit Diagnosis: No diagnosis found.   No orders of the defined types were placed in this encounter.   All questions were answered. The patient knows to call the clinic with any problems, questions or concerns. No barriers to learning was detected.  I have spent a total of *** minutes minutes of face-to-face and non-face-to-face time, preparing to see the patient, obtaining and/or reviewing separately obtained history, performing a medically appropriate examination, counseling and educating the patient, ordering tests, documenting clinical information in the electronic health record, and care coordination (communications with other health care professionals or caregivers).    Thank you for allowing me to participate in the care of this patient.    Barrie Folk, PA-C Department of Hematology/Oncology [facility] Phone: [phone]  [phone]    03/31/2022 9:58 PM

## 2022-03-31 NOTE — Telephone Encounter (Signed)
Returned Pt's call regarding sick sx. Pt reports burning with urination and chills without temp >100.4 since yesterday. Pt reports she is drinking fluids. Advised Pt she can seek care from Urgent Care tonight or see [place] tomorrow. Pt asking to be seen by [place]. Lab and [facility] appt scheduled for tomorrow with CMP/CBC/UA/UC orders placed. Pt verbalized understanding.

## 2022-04-01 ENCOUNTER — Other Ambulatory Visit (HOSPITAL_COMMUNITY): Payer: Self-pay

## 2022-04-01 ENCOUNTER — Other Ambulatory Visit: Payer: Self-pay

## 2022-04-01 ENCOUNTER — Inpatient Hospital Stay: Payer: Self-pay

## 2022-04-01 ENCOUNTER — Ambulatory Visit (HOSPITAL_COMMUNITY)
Admission: RE | Admit: 2022-04-01 | Discharge: 2022-04-01 | Disposition: A | Discharge status: Home / Self Care | Payer: Self-pay | Source: Ambulatory Visit | Attending: Physician Assistant | Admitting: Physician Assistant

## 2022-04-01 ENCOUNTER — Emergency Department (HOSPITAL_COMMUNITY)
Admission: EM | Admit: 2022-04-01 | Discharge: 2022-04-01 | Disposition: A | Discharge status: Home / Self Care | Payer: Self-pay | Source: Home / Self Care

## 2022-04-01 ENCOUNTER — Encounter: Payer: Self-pay | Admitting: Hematology and Oncology

## 2022-04-01 ENCOUNTER — Inpatient Hospital Stay (HOSPITAL_BASED_OUTPATIENT_CLINIC_OR_DEPARTMENT_OTHER): Payer: Self-pay | Admitting: Physician Assistant

## 2022-04-01 VITALS — BP 121/78 | HR 97 | Temp 98.2°F | Resp 18

## 2022-04-01 DIAGNOSIS — R300 Dysuria: Secondary | ICD-10-CM

## 2022-04-01 DIAGNOSIS — Z95828 Presence of other vascular implants and grafts: Secondary | ICD-10-CM

## 2022-04-01 DIAGNOSIS — I82621 Acute embolism and thrombosis of deep veins of right upper extremity: Secondary | ICD-10-CM

## 2022-04-01 DIAGNOSIS — C541 Malignant neoplasm of endometrium: Secondary | ICD-10-CM

## 2022-04-01 LAB — URINALYSIS, COMPLETE (UACMP) WITH MICROSCOPIC
Bilirubin Urine: NEGATIVE
Glucose, UA: NEGATIVE mg/dL
Ketones, ur: NEGATIVE mg/dL
Leukocytes,Ua: NEGATIVE
Nitrite: NEGATIVE
Protein, ur: 30 mg/dL — AB
Specific Gravity, Urine: 1.017 (ref 1.005–1.030)
pH: 5 (ref 5.0–8.0)

## 2022-04-01 LAB — CBC WITH DIFFERENTIAL (CANCER CENTER ONLY)
Abs Immature Granulocytes: 0.01 10*3/uL (ref 0.00–0.07)
Basophils Absolute: 0.1 10*3/uL (ref 0.0–0.1)
Basophils Relative: 1 %
Eosinophils Absolute: 0.1 10*3/uL (ref 0.0–0.5)
Eosinophils Relative: 3 %
HCT: 37.8 % (ref 36.0–46.0)
Hemoglobin: 11.5 g/dL — ABNORMAL LOW (ref 12.0–15.0)
Immature Granulocytes: 0 %
Lymphocytes Relative: 41 %
Lymphs Abs: 1.6 10*3/uL (ref 0.7–4.0)
MCH: 22.5 pg — ABNORMAL LOW (ref 26.0–34.0)
MCHC: 30.4 g/dL (ref 30.0–36.0)
MCV: 73.8 fL — ABNORMAL LOW (ref 80.0–100.0)
Monocytes Absolute: 0.6 10*3/uL (ref 0.1–1.0)
Monocytes Relative: 15 %
Neutro Abs: 1.7 10*3/uL (ref 1.7–7.7)
Neutrophils Relative %: 40 %
Platelet Count: 340 10*3/uL (ref 150–400)
RBC: 5.12 MIL/uL — ABNORMAL HIGH (ref 3.87–5.11)
RDW: 22.8 % — ABNORMAL HIGH (ref 11.5–15.5)
WBC Count: 4.1 10*3/uL (ref 4.0–10.5)
nRBC: 0 % (ref 0.0–0.2)

## 2022-04-01 LAB — CMP (CANCER CENTER ONLY)
ALT: 44 U/L (ref 0–44)
AST: 30 U/L (ref 15–41)
Albumin: 4.2 g/dL (ref 3.5–5.0)
Alkaline Phosphatase: 79 U/L (ref 38–126)
Anion gap: 7 (ref 5–15)
BUN: 9 mg/dL (ref 6–20)
CO2: 27 mmol/L (ref 22–32)
Calcium: 9.6 mg/dL (ref 8.9–10.3)
Chloride: 102 mmol/L (ref 98–111)
Creatinine: 0.54 mg/dL (ref 0.44–1.00)
GFR, Estimated: >60 mL/min (ref >60)
Glucose, Bld: 164 mg/dL — ABNORMAL HIGH (ref 70–99)
Potassium: 4.1 mmol/L (ref 3.5–5.1)
Sodium: 136 mmol/L (ref 135–145)
Total Bilirubin: 0.5 mg/dL (ref 0.3–1.2)
Total Protein: 8.1 g/dL (ref 6.5–8.1)

## 2022-04-01 MED ORDER — SODIUM CHLORIDE 0.9 % IV SOLN: Freq: Once | INTRAVENOUS | Status: AC

## 2022-04-01 MED ORDER — APIXABAN (ELIQUIS) VTE STARTER PACK (10MG AND 5MG)
ORAL_TABLET | ORAL | 0 refills | Status: DC
  Filled 2022-04-01: qty 74, 28d supply, fill #0

## 2022-04-01 MED ORDER — AMOXICILLIN-POT CLAVULANATE 875-125 MG PO TABS: 1.0000 | ORAL_TABLET | Freq: Two times a day (BID) | ORAL | 0 refills | Status: AC

## 2022-04-01 NOTE — Progress Notes (Signed)
This RN attempted to access patient's port X2 with no success. Fransisco Hertz, RN, attempted X1 with still no success. Although the port access needle would be directly over the access site, saline flush would have resistance and no blood return noted. Patient denied severe pain during access attempts. PIV started with patient's consent and IV fluids administered.

## 2022-04-01 NOTE — Progress Notes (Signed)
Upper extremity venous right study completed.  Stat preliminary results relayed to Jinny Blossom, Therapist, sports for [place], [place]. Patient referred to [facility] ED for further evaluation.   See CV Proc for preliminary results report.   Darlin Coco, RDMS, RVT

## 2022-04-01 NOTE — Progress Notes (Signed)
error 

## 2022-04-01 NOTE — Progress Notes (Signed)
Received STAT results from DVT study from Apolonio Schneiders, [place] in vascular lab. 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 This RN was notified 04/01/22 at 1455 and Sherol Dade, PA was notified of STAT result on 04/01/22 at 1456. Per PA pt to go to ED instead of returning to the cancer center clinic and vascular RN notified.

## 2022-04-02 ENCOUNTER — Telehealth: Payer: Self-pay | Admitting: Genetic Counselor

## 2022-04-02 ENCOUNTER — Ambulatory Visit: Payer: Self-pay | Admitting: Genetic Counselor

## 2022-04-02 ENCOUNTER — Encounter: Payer: Self-pay | Admitting: Genetic Counselor

## 2022-04-02 DIAGNOSIS — Z1211 Encounter for screening for malignant neoplasm of colon: Secondary | ICD-10-CM | POA: Insufficient documentation

## 2022-04-02 DIAGNOSIS — Z1379 Encounter for other screening for genetic and chromosomal anomalies: Secondary | ICD-10-CM

## 2022-04-02 LAB — URINE CULTURE

## 2022-04-02 NOTE — Telephone Encounter (Signed)
Revealed negative genetic testing.  Discussed that we do not know why she has uterine cancer or why there is cancer in the family. It could be due to a different gene that we are not testing, or maybe our current technology may not be able to pick something up.  It will be important for her to keep in contact with genetics to keep up with whether additional testing may be needed.   One VUS was identified that will not change medical management.

## 2022-04-02 NOTE — Progress Notes (Signed)
HPI:  Ms. Nogales was previously seen in the [facility] due to a personal and family history of cancer and concerns regarding a hereditary predisposition to cancer. Please refer to our prior cancer genetics clinic note for more information regarding our discussion, assessment and recommendations, at the time. Ms. Niehaus recent genetic test results were disclosed to her, as were recommendations warranted by these results. These results and recommendations are discussed in more detail below.  CANCER HISTORY:  Oncology History Overview Note  Endometrioid high grade dMMR abnormal   Endometrial cancer ([place])  01/13/2022 Imaging   CT Abdomen/Pelvis: IMPRESSION: Heterogenous cystic mass centered in the cervix concerning for neoplasm. Endometrial thickening possibly due to obstruction from the cystic mass. Direct visualization is recommended.   Enlarged right para-aortic and right external iliac nodes are indeterminate but concerning for metastasis given cervical lesion.   Borderline enlargement and fecalization of the distal ileum. No definite transition point. Findings suggestive of slow transit/constipation.   01/14/2022 Initial Biopsy   Endometrial biopsy and ECC: A. ENDOCERVIX, CURETTAGE: - BENIGN ENDOCERVICAL MUCOSA WITH ACUTE AND CHRONIC INFLAMMATION - NEGATIVE FOR MALIGNANCY  B. 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  The tumor cells are partially positive for p16. The p53 stain shows foci with wild-type staining and foci with overexpression.  The tumor cells show patchy positivity for ER and PR and are negative for Napsin A.  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   Drs. Doreene Eland reviewed the case and agree with the above diagnosis.  Clinical correlation recommended.    01/14/2022 Initial Diagnosis   Endometrial cancer ([place])   01/24/2022 Imaging   MR pelvis 1. There is an enhancing mass identified within the lower uterine segment which measures 4.3 x 3.9 by 4.7 cm. This invades the anterior myometrium within the lower uterine segment. This appears to terminate at the level of the internal os of the cervix. Findings are compatible with endometrial carcinoma. 2. Aortocaval, right common iliac, and bilateral pelvic sidewall lymph nodes are enlarged compatible with metastatic adenopathy. Given the presence of periaortic and pelvic node involvement imaging findings are compatible with stage IIIC2 disease.   02/06/2022 PET scan   1. Diffusely increased uptake within the endometrium and cervix compatible with known endometrial carcinoma. 2. Tracer avid retroperitoneal and pelvic lymph nodes compatible with nodal metastasis. 3. No signs of solid organ metastasis or distant metastatic disease. 4. Nonspecific, diffuse increased bone marrow uptake is noted within the axial and appendicular skeleton. No focal areas of increased uptake to suggest osseous metastasis. 5. Multifocal bilateral areas of fibrosis, architectural distortion and scarring within both lungs which is favored to represent a inflammatory or infectious process. Findings may be the sequelae of prior atypical viral infection.   02/10/2022 Pathology Results   A. RIGHT AORTOCAVAL LYMPH NODE, EXCISION: Two benign lymph nodes, negative for carcinoma (0/2)  B. RIGHT PELVIC LYMPH NODE, EXCISION: Two of two lymph nodes with metastatic adenocarcinoma (2/2)  C. LEFT OBTURATOR PELVIC LYMPH NODE, EXCISION: Three lymph nodes, negative for carcinoma (0/3)  D. UTERUS WITH RIGHT AND LEFT FALLOPIAN TUBE AND OVARY, HYSTERECTOMY AND BILATERAL SALPINGO-OOPHORECTOMY: Invasive moderate to poorly  differentiated endometrioid adenocarcinoma, FIGO 3 Tumor measures 5.8 x 3.0 and invades 1.0 cm and  30% of the myometrium (10 of 33 mm) (pT1a) Tumor extends into the anterior lower uterine segment Background complex atypical hyperplasia (EIN) Angiolymphatic invasion present Focal adenomyosis Benign leiomyoma, intramural, measuring 0.6 cm in greatest dimension Chronic cervicitis with squamous metaplasia Benign cystic follicles and luteal cyst of left ovary Bilateral hydrosalpinx Benign right ovary  E. RIGHT POSTERIOR CERVICAL MARGIN, EXCISION: Benign cervical stroma Negative for carcinoma  ONCOLOGY TABLE:  UTERUS, CARCINOMA OR CARCINOSARCOMA: Resection  Procedure: Total hysterectomy and bilateral salpingo-oophorectomy with node sampling Histologic Type: Endometrioid adenocarcinoma Histologic Grade: High-grade, FIGO 3 Myometrial Invasion:      Depth of Myometrial Invasion (mm): 10 mm      Myometrial Thickness (mm): 33 mm      Percentage of Myometrial Invasion: 30% Uterine Serosa Involvement: Not identified Cervical stromal Involvement: Not identified Extent of involvement of other tissue/organs: Not identified Peritoneal/Ascitic Fluid: []  Lymphovascular Invasion: Present Regional Lymph Nodes:      Pelvic Lymph Nodes Examined: 5 nonsentinel lymph nodes      Pelvic Lymph Nodes with Metastasis: 2          Macrometastasis: (>2.0 mm): 2          Micrometastasis: (>0.2 mm and < 2.0 mm): 0          Isolated Tumor Cells (<0.2 mm): 0          Laterality of Lymph Node with Tumor: Right          Extracapsular Extension: Not identified      Para-aortic Lymph Nodes Examined: 2 nonsentinel lymph nodes       Para-aortic Lymph Nodes with Metastasis: 0          Macrometastasis: (>2.0 mm): 0          Micrometastasis: (>0.2 mm and < 2.0 mm): 0          Isolated Tumor Cells (<0.2 mm): 0 Distant Metastasis: Not applicable Pathologic Stage Classification (pTNM, AJCC 8th Edition): pT1a, pN1a   Ancillary Studies: MMR testing has been ordered and the results will be issued within an addendum to this report.    02/10/2022 Surgery   Preoperative Diagnosis: Endometrioid endometrial cancer FIGO grade 2, Morbid obesity (BMI 44)    Procedures: Robotic-assisted total laparoscopic hysterectomy, bilateral salpingo-oophorectomy, bilateral sentinel lymph node injection and debulking of enlarge, PET avid pelvic and para-aortic lymph nodes (Modifer 22: extreme morbid obesity, BMI 44, with significant retroperitoneal and intraperitoneal adiposity requiring additional OR personnel for positioning and retraction. Obesity made retroperitoneal visualization limited, increasing the complexity of the case and necessitating additional instrumentation for retraction and to create safe exposure. Obesity related complexity increased the duration of the procedure by 60 minutes.)   Surgeon: Bernadene Bell, MD    Findings: Normal upper abdominal survey with normal liver surface and diaphragm. Normal appearing small and large bowel. Globally enlarged uterus. Overall normal tubes, and ovaries, small paratubal cyst. No evidence of peritoneal disease, ascites, or carcinomatosis. Enlarged bilateral pelvic and right para-aortic lymph nodes removed. Tumor spill into the vagina and pelvis noted with colpotomy. Pelvis and vagina copiously irrigated. Significant intra-abdominal adiposity.   02/23/2022 Cancer Staging   Staging form: Corpus Uteri - Carcinoma and Carcinosarcoma, AJCC 8th Edition - Pathologic stage from 02/23/2022: Stage IIIC1 (pT1a, pN1, cM0) - Signed by Heath Lark, MD on 02/23/2022 Stage prefix: Initial diagnosis   03/05/2022 Procedure   Status post right IJ port catheter placement.    03/20/2022 -  Chemotherapy   Patient is on Treatment Plan : UTERINE  ENDOMETRIAL Dostarlimab-gxly (500 mg) + Carboplatin (AUC 5) + Paclitaxel (175 mg/m2) q21d x 6 cycles / Dostarlimab-gxly (1000 mg) q42d x 6 cycles       04/01/2022 Genetic Testing   Negative genetic testing on the CancerNext-Expanded+RNAinsight panel.  MSH3 VUS identified.  The report date is April 01, 2022.  The CancerNext-Expanded gene panel offered by [facility] and includes sequencing and rearrangement analysis for the following 77 genes: AIP, ALK, APC*, ATM*, AXIN2, BAP1, BARD1, BMPR1A, BRCA1*, BRCA2*, BRIP1*, CDC73, CDH1*, CDK4, CDKN1B, CDKN2A, CHEK2*, CTNNA1, DICER1, FH, FLCN, KIF1B, LZTR1, MAX, MEN1, MET, MLH1*, MSH2*, MSH3, MSH6*, MUTYH*, NF1*, NF2, NTHL1, PALB2*, PHOX2B, PMS2*, POT1, PRKAR1A, PTCH1, PTEN*, RAD51C*, RAD51D*, RB1, RET, SDHA, SDHAF2, SDHB, SDHC, SDHD, SMAD4, SMARCA4, SMARCB1, SMARCE1, STK11, SUFU, TMEM127, TP53*, TSC1, TSC2, and VHL (sequencing and deletion/duplication); EGFR, EGLN1, HOXB13, KIT, MITF, PDGFRA, POLD1, and POLE (sequencing only); EPCAM and GREM1 (deletion/duplication only). DNA and RNA analyses performed for * genes.      FAMILY HISTORY:  We obtained a detailed, 4-generation family history.  Significant diagnoses are listed below: Family History  Problem Relation Age of Onset   Colon cancer Father 60   Uterine cancer Sister 6   Melanoma Sister 63   Cancer Paternal Aunt        possible brain cancer, dx > 74   Breast cancer Neg Hx    Ovarian cancer Neg Hx    Endometrial cancer Neg Hx    Pancreatic cancer Neg Hx    Prostate cancer Neg Hx        The patient has three daughters and a son who are cancer free. She has four sisters and a brother. Her older sister was diagnosed with uterine cancer at 53 and melanoma at 101.  Currently, it is thought that she has metastatic disease as there are mets in her brain.  All other siblings are younger than the patient.  The patient's parents are living.   The patient's father was diagnosed with colon cancer at 41.  He has one sister who had possibly a brain tumor.  His mother died of 'digestive issues' and his father died young of unknown causes.   The  patient's mother does not have cancer.  She has 12 siblings with no reported incidences of cancer. The maternal grandparents were not reported to have cancer.   Ms. Mccallon is unaware of previous family history of genetic testing for hereditary cancer risks. Patient's maternal ancestors are of [place] descent, and paternal ancestors are of [place] and [place] descent. There is no reported Ashkenazi Jewish ancestry. There is no known consanguinity  GENETIC TEST RESULTS: Genetic testing reported out on April 01, 2022 through the CancerNext-Expanded+RNAinsight cancer panel found no pathogenic mutations. The CancerNext-Expanded gene panel offered by [facility] and includes sequencing and rearrangement analysis for the following 77 genes: AIP, ALK, APC*, ATM*, AXIN2, BAP1, BARD1, BMPR1A, BRCA1*, BRCA2*, BRIP1*, CDC73, CDH1*, CDK4, CDKN1B, CDKN2A, CHEK2*, CTNNA1, DICER1, FH, FLCN, KIF1B, LZTR1, MAX, MEN1, MET, MLH1*, MSH2*, MSH3, MSH6*, MUTYH*, NF1*, NF2, NTHL1, PALB2*, PHOX2B, PMS2*, POT1, PRKAR1A, PTCH1, PTEN*, RAD51C*, RAD51D*, RB1, RET, SDHA, SDHAF2, SDHB, SDHC, SDHD, SMAD4, SMARCA4, SMARCB1, SMARCE1, STK11, SUFU, TMEM127, TP53*, TSC1, TSC2, and VHL (sequencing and deletion/duplication); EGFR, EGLN1, HOXB13, KIT, MITF, PDGFRA, POLD1, and POLE (sequencing only); EPCAM and GREM1 (deletion/duplication only). DNA and RNA analyses performed for * genes. The test report has been scanned into EPIC and is located under the Molecular Pathology section of the Results Review tab.  A portion  of the result report is included below for reference.     We discussed with Ms. Vanbramer that because current genetic testing is not perfect, it is possible there may be a gene mutation in one of these genes that current testing cannot detect, but that chance is small.  We also discussed, that there could be another gene that has not yet been discovered, or that we have not yet tested, that is responsible for the cancer  diagnoses in the family. It is also possible there is a hereditary cause for the cancer in the family that Ms. Kokal did not inherit and therefore was not identified in her testing.  Therefore, it is important to remain in touch with cancer genetics in the future so that we can continue to offer Ms. Frangipane the most up to date genetic testing.   Genetic testing did identify a variant of uncertain significance (VUS) was identified in the [facility] gene called [id number].  At this time, it is unknown if this variant is associated with increased cancer risk or if this is a normal finding, but most variants such as this get reclassified to being inconsequential. It should not be used to make medical management decisions. With time, we suspect the lab will determine the significance of this variant, if any. If we do learn more about it, we will try to contact @M @ @LNAME @ to discuss it further. However, it is important to stay in touch with [place] periodically and keep the address and phone number up to date.  ADDITIONAL GENETIC TESTING: We discussed with Ms. Crump that her genetic testing was fairly extensive.  If there are genes identified to increase cancer risk that can be analyzed in the future, we would be happy to discuss and coordinate this testing at that time.    CANCER SCREENING RECOMMENDATIONS: Ms. Felkins test result is considered negative (normal).  This means that we have not identified a hereditary cause for her personal and family history of cancer at this time. Most cancers happen by chance and this negative test suggests that her cancer may fall into this category.    While reassuring, this does not definitively rule out a hereditary predisposition to cancer. It is still possible that there could be genetic mutations that are undetectable by current technology. There could be genetic mutations in genes that have not been tested or identified to increase cancer risk.  Therefore, it is recommended she  continue to follow the cancer management and screening guidelines provided by her oncology and primary healthcare provider.   An individual's cancer risk and medical management are not determined by genetic test results alone. Overall cancer risk assessment incorporates additional factors, including personal medical history, family history, and any available genetic information that may result in a personalized plan for cancer prevention and surveillance  RECOMMENDATIONS FOR FAMILY MEMBERS:  Individuals in this family might be at some increased risk of developing cancer, over the general population risk, simply due to the family history of cancer.  We recommended women in this family have a yearly mammogram beginning at age 75, or 57 years younger than the earliest onset of cancer, an annual clinical breast exam, and perform monthly breast self-exams. Women in this family should also have a gynecological exam as recommended by their primary provider. All family members should be referred for colonoscopy starting at age 72.  FOLLOW-UP: Lastly, we discussed with Ms. Philpot that cancer genetics is a rapidly advancing field and it is possible that new  genetic tests will be appropriate for her and/or her family members in the future. We encouraged her to remain in contact with cancer genetics on an annual basis so we can update her personal and family histories and let her know of advances in cancer genetics that may benefit this family.   Our contact number was provided. Ms. Glatt questions were answered to her satisfaction, and she knows she is welcome to call us at anytime with additional questions or concerns.   Roma Kayser, [place], [facility] Licensed, Certified Genetic Counselor [email or web address]

## 2022-04-07 ENCOUNTER — Other Ambulatory Visit: Payer: Self-pay | Admitting: Physician Assistant

## 2022-04-07 ENCOUNTER — Ambulatory Visit (HOSPITAL_COMMUNITY)
Admission: RE | Admit: 2022-04-07 | Discharge: 2022-04-07 | Disposition: A | Discharge status: Home / Self Care | Payer: Self-pay | Source: Ambulatory Visit | Attending: Physician Assistant | Admitting: Physician Assistant

## 2022-04-07 ENCOUNTER — Other Ambulatory Visit: Payer: Self-pay

## 2022-04-07 ENCOUNTER — Ambulatory Visit: Payer: Self-pay

## 2022-04-07 ENCOUNTER — Ambulatory Visit: Payer: Self-pay | Admitting: Hematology and Oncology

## 2022-04-07 DIAGNOSIS — Z452 Encounter for adjustment and management of vascular access device: Secondary | ICD-10-CM | POA: Insufficient documentation

## 2022-04-07 DIAGNOSIS — Z95828 Presence of other vascular implants and grafts: Secondary | ICD-10-CM

## 2022-04-07 DIAGNOSIS — I82621 Acute embolism and thrombosis of deep veins of right upper extremity: Secondary | ICD-10-CM

## 2022-04-07 DIAGNOSIS — C541 Malignant neoplasm of endometrium: Secondary | ICD-10-CM

## 2022-04-07 DIAGNOSIS — R300 Dysuria: Secondary | ICD-10-CM

## 2022-04-07 HISTORY — PX: IR CHEST FLUORO: IMG2383

## 2022-04-07 MED ORDER — IOHEXOL 300 MG/ML  SOLN: 50.0000 mL | Freq: Once | INTRAMUSCULAR | Status: DC | PRN

## 2022-04-08 ENCOUNTER — Other Ambulatory Visit: Payer: Self-pay | Admitting: Radiology

## 2022-04-09 ENCOUNTER — Encounter (HOSPITAL_COMMUNITY): Payer: Self-pay

## 2022-04-09 ENCOUNTER — Other Ambulatory Visit: Payer: Self-pay

## 2022-04-09 ENCOUNTER — Other Ambulatory Visit: Payer: Self-pay | Admitting: Physician Assistant

## 2022-04-09 ENCOUNTER — Ambulatory Visit (HOSPITAL_COMMUNITY)
Admission: RE | Admit: 2022-04-09 | Discharge: 2022-04-09 | Disposition: A | Discharge status: Home / Self Care | Payer: Self-pay | Source: Ambulatory Visit | Attending: Physician Assistant | Admitting: Physician Assistant

## 2022-04-09 DIAGNOSIS — T82524A Displacement of infusion catheter, initial encounter: Secondary | ICD-10-CM | POA: Insufficient documentation

## 2022-04-09 DIAGNOSIS — R0609 Other forms of dyspnea: Secondary | ICD-10-CM | POA: Insufficient documentation

## 2022-04-09 DIAGNOSIS — Z95828 Presence of other vascular implants and grafts: Secondary | ICD-10-CM

## 2022-04-09 DIAGNOSIS — Z86718 Personal history of other venous thrombosis and embolism: Secondary | ICD-10-CM | POA: Insufficient documentation

## 2022-04-09 DIAGNOSIS — Y849 Medical procedure, unspecified as the cause of abnormal reaction of the patient, or of later complication, without mention of misadventure at the time of the procedure: Secondary | ICD-10-CM | POA: Insufficient documentation

## 2022-04-09 DIAGNOSIS — Z8542 Personal history of malignant neoplasm of other parts of uterus: Secondary | ICD-10-CM | POA: Insufficient documentation

## 2022-04-09 HISTORY — PX: IR PORT REPAIR CENTRAL VENOUS ACCESS DEVICE: IMG5775

## 2022-04-09 HISTORY — PX: IR US GUIDE VASC ACCESS RIGHT: IMG2390

## 2022-04-09 HISTORY — PX: IR CV LINE INJECTION: IMG2294

## 2022-04-09 MED ORDER — FENTANYL CITRATE (PF) 100 MCG/2ML IJ SOLN
INTRAMUSCULAR | Status: AC | PRN
  Administered 2022-04-09 (×2): 50 ug via INTRAVENOUS

## 2022-04-09 MED ORDER — LIDOCAINE HCL 1 % IJ SOLN
10.0000 mL | Freq: Once | INTRAMUSCULAR | Status: AC
  Administered 2022-04-09: 10 mL via INTRADERMAL

## 2022-04-09 MED ORDER — SODIUM CHLORIDE 0.9 % IV SOLN: INTRAVENOUS | Status: DC

## 2022-04-09 MED ORDER — LIDOCAINE HCL 1 % IJ SOLN
INTRAMUSCULAR | Status: AC | PRN
  Administered 2022-04-09: 5 mL

## 2022-04-09 MED ORDER — LIDOCAINE HCL 1 % IJ SOLN
INTRAMUSCULAR | Status: AC
  Filled 2022-04-09: qty 20

## 2022-04-09 MED ORDER — FENTANYL CITRATE (PF) 100 MCG/2ML IJ SOLN
INTRAMUSCULAR | Status: AC
  Filled 2022-04-09: qty 2

## 2022-04-09 MED ORDER — MIDAZOLAM HCL 2 MG/2ML IJ SOLN
INTRAMUSCULAR | Status: AC
  Filled 2022-04-09: qty 4

## 2022-04-09 MED ORDER — IOHEXOL 300 MG/ML  SOLN
50.0000 mL | Freq: Once | INTRAMUSCULAR | Status: AC | PRN
  Administered 2022-04-09: 20 mL via INTRAVENOUS

## 2022-04-09 MED ORDER — MIDAZOLAM HCL 2 MG/2ML IJ SOLN
INTRAMUSCULAR | Status: AC | PRN
  Administered 2022-04-09 (×2): 1 mg via INTRAVENOUS

## 2022-04-09 MED ORDER — HEPARIN SOD (PORK) LOCK FLUSH 100 UNIT/ML IV SOLN
INTRAVENOUS | Status: AC
  Filled 2022-04-09: qty 5

## 2022-04-09 MED FILL — Fosaprepitant Dimeglumine For IV Infusion 150 MG (Base Eq): INTRAVENOUS | Qty: 5 | Status: AC

## 2022-04-09 MED FILL — Dexamethasone Sodium Phosphate Inj 100 MG/10ML: INTRAMUSCULAR | Qty: 1 | Status: AC

## 2022-04-09 NOTE — Discharge Instructions (Signed)

## 2022-04-09 NOTE — H&P (Signed)
Referring Physician(s): Barrie Folk  Supervising Physician: Michaelle Birks  Patient Status:  [facility] OP  Chief Complaint:  Malpositioned Port-A-Cath tip  Subjective: Patient known to IR service from Port-A-Cath placement on 03/05/2022 and image guided Port-A-Cath evaluation on 04/07/2022 due to ability to flush.  He revealed a retracted and cannot right chest port.  The port would not aspirate.  She presents again today for Port-A-Cath tip repositioning versus revision.  She has a history of endometrial cancer as well as recently noted right IJ/subclavian/brachial vein DVT.  She has also had a recent URI and dysuria.  Currently denies fever, headache, abdominal pain, back pain, nausea, vomiting or bleeding.  She does have occasional cough, dyspnea with exertion, some right-sided chest discomfort especially with coughing.   Past Medical History:  Diagnosis Date   Dyspnea    with activity   Endometrial cancer    Family history of colon cancer 03/04/2022   Family history of uterine cancer    Fast heart beat    with activity   Fatigue    Hearing loss    Left ear   History of kidney stones    12 years ago   Lazy eye, left    Left leg numbness    occ   Migraine    occ   Past Surgical History:  Procedure Laterality Date   CHOLECYSTECTOMY  2011   IR CHEST FLUORO  04/07/2022   IR IMAGING GUIDED PORT INSERTION  03/05/2022   ROBOTIC ASSISTED TOTAL HYSTERECTOMY WITH BILATERAL SALPINGO OOPHERECTOMY N/A 02/10/2022   Procedure: XI ROBOTIC ASSISTED TOTAL HYSTERECTOMY WITH BILATERAL SALPINGO OOPHORECTOMY;  Surgeon: Bernadene Bell, MD;  Location: [facility] ORS;  Service: Gynecology;  Laterality: N/A;   ROBOTIC PELVIC AND PARA-AORTIC LYMPH NODE DISSECTION N/A 02/10/2022   Procedure: XI ROBOTIC PELVIC AND PARA-AORTIC SENTINEL LYMPH NODE DISSECTION;  Surgeon: Bernadene Bell, MD;  Location: [facility];  Service: Gynecology;  Laterality: N/A;           Allergies: Advil  [ibuprofen]  Medications: Prior to Admission medications   Medication Sig Start Date End Date Taking? Authorizing Provider  APIXABAN (ELIQUIS) VTE STARTER PACK (10MG  AND 5MG ) Take as directed on package: start with two-5mg  tablets twice daily for 7 days. On day 8, switch to one-5mg  tablet twice daily. 04/01/22   Sherol Dade E, PA-C  dexamethasone (DECADRON) 4 MG tablet Take 2 tabs at the night before and 2 tab the morning of chemotherapy, every 3 weeks, by mouth x 6 cycles 03/02/22   Heath Lark, MD  ferrous sulfate 325 (65 FE) MG EC tablet Take 1 tablet (325 mg total) by mouth in the morning and at bedtime. Patient not taking: Reported on 02/23/2022 02/04/22   Bernadene Bell, MD  ibuprofen (ADVIL) 800 MG tablet Take 1 tablet (800 mg total) by mouth every 8 (eight) hours as needed for moderate pain. For AFTER surgery only Patient not taking: Reported on 03/20/2022 02/10/22   Joylene John D, NP  lidocaine-prilocaine (EMLA) cream Apply to affected area once 03/02/22   Heath Lark, MD  ondansetron (ZOFRAN) 8 MG tablet Take 1 tablet (8 mg total) by mouth every 8 (eight) hours as needed for nausea or vomiting. Start on the third day after chemotherapy. 03/02/22   Heath Lark, MD  oxyCODONE (OXY IR/ROXICODONE) 5 MG immediate release tablet Take 1 tablet (5 mg total) by mouth every 4 (four) hours as needed for severe pain. For AFTER surgery, do not take and drive, do not take  with tramadol 02/11/22   Cross, Lenna Sciara D, NP  prochlorperazine (COMPAZINE) 10 MG tablet Take 1 tablet (10 mg total) by mouth every 6 (six) hours as needed for nausea or vomiting. 03/02/22   Heath Lark, MD  senna-docusate (SENOKOT-S) 8.6-50 MG tablet Take 2 tablets by mouth at bedtime. For AFTER surgery, do not take if having diarrhea 02/10/22   Joylene John D, NP     Vital Signs: BP (!) 155/98   Pulse 80   Temp 98.2 F (36.8 C) (Oral)   Resp 18   LMP 01/06/2022 Comment: Hysterectomy 02/10/22  SpO2 96%    Code Status:   FULL CODE     Physical Exam: Awake, alert.  Chest clear to auscultation bilaterally.  Clean, intact right chest wall Port-A-Cath.  Heart with regular rate and rhythm.  Abdomen soft, obese, positive bowel sounds, nontender.  No significant lower extremity edema.  Imaging: IR Chest Fluoro  Result Date: 04/07/2022 INDICATION: Unableto flush port EXAM: EVALUATION OF PORT A CATH UNDER FLUOROSCOPY COMPARISON:  IR fluoroscopy 03/05/2022. MEDICATIONS: None ANESTHESIA/SEDATION: None CONTRAST:  None FLUOROSCOPY TIME:  Fluoroscopic dose; 2 mGy COMPLICATIONS: None immediate. PROCEDURE: The patient's right anterior chest wall subclavian vein approach port a catheter had been previously accessed. The patient was placed supine on the fluoroscopy table. A preprocedural spot radiographic image was obtained of the existing right subclavian vein approach port a catheter with tip overlying the distal aspect of the SVC. Port a Catheter would NOT easily aspirate. Port catheter injection was aborted. The Port a catheter was flushed with a heparin dwell and de-accessed. A dressing was placed. The patient tolerated the procedure well without immediate postprocedural complication. FINDINGS: Retracted and kinked RIGHT port catheter. The catheter would NOT aspirate, port injection was aborted. IMPRESSION: Retracted and kinked RIGHT port catheter. *DO NOT USE RIGHT chest port. PLAN: *Discussed revision versus replacement of port catheter. *Patient is amenable for revision as a first step. She will be scheduled to return this week for procedure. Michaelle Birks, MD [facility] Radiology Electronically Signed   By: Michaelle Birks M.D.   On: 04/07/2022 09:50    Labs:  CBC: Recent Labs    02/11/22 0433 02/11/22 0846 02/11/22 2232 02/12/22 0414 03/17/22 1504 04/01/22 1208  WBC 14.1*  --   --  9.9 9.0 4.1  HGB 7.3*   < > 7.8* 8.1* 10.5* 11.5*  HCT 25.7*   < > 27.6* 28.3* 34.9* 37.8   PLT 629*  --   --  426* 375 340   < > = values in this interval not displayed.    COAGS: No results for input(s): "INR", "APTT" in the last 8760 hours.  BMP: Recent Labs    02/11/22 0433 02/12/22 0414 03/17/22 1504 04/01/22 1208  NA 132* 137 138 136  K 4.1 4.3 3.8 4.1  CL 101 101 104 102  CO2 24 25 26 27   GLUCOSE 168* 134* 172* 164*  BUN 7 12 12 9   CALCIUM 7.9* 8.0* 9.4 9.6  CREATININE 0.48 0.45 0.50 0.54  GFRNONAA >60 >60 >60 >60    LIVER FUNCTION TESTS: Recent Labs    01/12/22 2223 02/02/22 0951 03/17/22 1504 04/01/22 1208  BILITOT 0.5 0.3 0.3 0.5  AST 42* 17 20 30   ALT 41 17 27 44  ALKPHOS 85 68 79 79  PROT 8.0 7.8 8.0 8.1  ALBUMIN 3.3* 3.4* 3.8 4.2    Assessment and Plan: 52 year old female with history of endometrial cancer,  recently noted right IJ/subclavian/brachial DVT, recent URI, malpositioned Port-A-Cath tip; presents today for Port-A-Cath tip repositioning versus revision.  Details/risks of procedure, including but not limited to, internal bleeding, infection, injury to adjacent structures discussed with patient with her understanding and consent.   Electronically Signed: D. Rowe Robert, PA-C 04/09/2022, 1:10 PM   I spent a total of 20 minutes at the the patient's bedside AND on the patient's hospital floor or unit, greater than 50% of which was counseling/coordinating care for Port-A-Cath tip repositioning versus revision

## 2022-04-09 NOTE — Procedures (Signed)
Vascular and Interventional Radiology Procedure Note  Patient: Maureen Campos DOB: 02/07/1970 Medical Record Number: [id number] Note Date/Time: 04/09/22 3:10 PM   Performing Physician: Michaelle Birks, MD Assistant(s): None  Diagnosis:  Malpositioned R port catheter  Procedure: PORT CATHETER REVISION  Anesthesia: Conscious Sedation Complications: None Estimated Blood Loss: Minimal  Findings:  Successful revision of right-sided port by snare technique. Tip of the catheter is now appropriately located at the superior cavo atrial junction.  Plan: Catheter ready for use.  See detailed procedure note with images in PACS. The patient tolerated the procedure well without incident or complication and was returned to Short Stay in stable condition.    Michaelle Birks, MD [facility] Radiology   Pager. [place]

## 2022-04-10 ENCOUNTER — Inpatient Hospital Stay: Payer: Self-pay

## 2022-04-10 ENCOUNTER — Inpatient Hospital Stay: Payer: Self-pay | Attending: Psychiatry

## 2022-04-10 ENCOUNTER — Inpatient Hospital Stay (HOSPITAL_BASED_OUTPATIENT_CLINIC_OR_DEPARTMENT_OTHER): Payer: Self-pay | Admitting: Physician Assistant

## 2022-04-10 ENCOUNTER — Inpatient Hospital Stay (HOSPITAL_BASED_OUTPATIENT_CLINIC_OR_DEPARTMENT_OTHER): Payer: Self-pay | Admitting: Hematology and Oncology

## 2022-04-10 VITALS — BP 128/72 | HR 88 | Temp 97.7°F | Resp 18 | Ht 60.0 in | Wt 226.8 lb

## 2022-04-10 VITALS — BP 132/81 | HR 84 | Resp 18

## 2022-04-10 DIAGNOSIS — Z9079 Acquired absence of other genital organ(s): Secondary | ICD-10-CM | POA: Insufficient documentation

## 2022-04-10 DIAGNOSIS — Z90722 Acquired absence of ovaries, bilateral: Secondary | ICD-10-CM | POA: Insufficient documentation

## 2022-04-10 DIAGNOSIS — T380X5A Adverse effect of glucocorticoids and synthetic analogues, initial encounter: Secondary | ICD-10-CM | POA: Insufficient documentation

## 2022-04-10 DIAGNOSIS — C541 Malignant neoplasm of endometrium: Secondary | ICD-10-CM

## 2022-04-10 DIAGNOSIS — L708 Other acne: Secondary | ICD-10-CM

## 2022-04-10 DIAGNOSIS — Z7901 Long term (current) use of anticoagulants: Secondary | ICD-10-CM | POA: Insufficient documentation

## 2022-04-10 DIAGNOSIS — B372 Candidiasis of skin and nail: Secondary | ICD-10-CM | POA: Insufficient documentation

## 2022-04-10 DIAGNOSIS — R0602 Shortness of breath: Secondary | ICD-10-CM

## 2022-04-10 DIAGNOSIS — Z5112 Encounter for antineoplastic immunotherapy: Secondary | ICD-10-CM | POA: Insufficient documentation

## 2022-04-10 DIAGNOSIS — Z5111 Encounter for antineoplastic chemotherapy: Secondary | ICD-10-CM | POA: Insufficient documentation

## 2022-04-10 DIAGNOSIS — E1165 Type 2 diabetes mellitus with hyperglycemia: Secondary | ICD-10-CM | POA: Insufficient documentation

## 2022-04-10 DIAGNOSIS — T451X5A Adverse effect of antineoplastic and immunosuppressive drugs, initial encounter: Secondary | ICD-10-CM

## 2022-04-10 DIAGNOSIS — I82C11 Acute embolism and thrombosis of right internal jugular vein: Secondary | ICD-10-CM | POA: Insufficient documentation

## 2022-04-10 DIAGNOSIS — Z90710 Acquired absence of both cervix and uterus: Secondary | ICD-10-CM | POA: Insufficient documentation

## 2022-04-10 LAB — CBC WITH DIFFERENTIAL (CANCER CENTER ONLY)
Abs Immature Granulocytes: 0.04 10*3/uL (ref 0.00–0.07)
Basophils Absolute: 0.1 10*3/uL (ref 0.0–0.1)
Basophils Relative: 1 %
Eosinophils Absolute: 0.1 10*3/uL (ref 0.0–0.5)
Eosinophils Relative: 1 %
HCT: 39 % (ref 36.0–46.0)
Hemoglobin: 11.8 g/dL — ABNORMAL LOW (ref 12.0–15.0)
Immature Granulocytes: 0 %
Lymphocytes Relative: 14 %
Lymphs Abs: 1.4 10*3/uL (ref 0.7–4.0)
MCH: 23 pg — ABNORMAL LOW (ref 26.0–34.0)
MCHC: 30.3 g/dL (ref 30.0–36.0)
MCV: 76.2 fL — ABNORMAL LOW (ref 80.0–100.0)
Monocytes Absolute: 0.2 10*3/uL (ref 0.1–1.0)
Monocytes Relative: 2 %
Neutro Abs: 8.3 10*3/uL — ABNORMAL HIGH (ref 1.7–7.7)
Neutrophils Relative %: 82 %
Platelet Count: 334 10*3/uL (ref 150–400)
RBC: 5.12 MIL/uL — ABNORMAL HIGH (ref 3.87–5.11)
RDW: 22.7 % — ABNORMAL HIGH (ref 11.5–15.5)
WBC Count: 10.1 10*3/uL (ref 4.0–10.5)
nRBC: 0 % (ref 0.0–0.2)

## 2022-04-10 LAB — CMP (CANCER CENTER ONLY)
ALT: 37 U/L (ref 0–44)
AST: 28 U/L (ref 15–41)
Albumin: 4 g/dL (ref 3.5–5.0)
Alkaline Phosphatase: 78 U/L (ref 38–126)
Anion gap: 6 (ref 5–15)
BUN: 9 mg/dL (ref 6–20)
CO2: 29 mmol/L (ref 22–32)
Calcium: 9.9 mg/dL (ref 8.9–10.3)
Chloride: 102 mmol/L (ref 98–111)
Creatinine: 0.79 mg/dL (ref 0.44–1.00)
GFR, Estimated: >60 mL/min (ref >60)
Glucose, Bld: 242 mg/dL — ABNORMAL HIGH (ref 70–99)
Potassium: 4 mmol/L (ref 3.5–5.1)
Sodium: 137 mmol/L (ref 135–145)
Total Bilirubin: 0.4 mg/dL (ref 0.3–1.2)
Total Protein: 7.8 g/dL (ref 6.5–8.1)

## 2022-04-10 MED ORDER — SODIUM CHLORIDE 0.9 % IV SOLN: Freq: Once | INTRAVENOUS | Status: DC | PRN

## 2022-04-10 MED ORDER — METHYLPREDNISOLONE SODIUM SUCC 125 MG IJ SOLR
125.0000 mg | Freq: Once | INTRAMUSCULAR | Status: AC | PRN
  Administered 2022-04-10: 62.5 mg via INTRAVENOUS

## 2022-04-10 MED ORDER — FAMOTIDINE IN NACL 20-0.9 MG/50ML-% IV SOLN
20.0000 mg | Freq: Once | INTRAVENOUS | Status: AC
  Administered 2022-04-10: 20 mg via INTRAVENOUS
  Filled 2022-04-10: qty 50

## 2022-04-10 MED ORDER — METRONIDAZOLE 1 % EX GEL: Freq: Every day | CUTANEOUS | 0 refills | Status: DC

## 2022-04-10 MED ORDER — SODIUM CHLORIDE 0.9 % IV SOLN
500.0000 mg | Freq: Once | INTRAVENOUS | Status: AC
  Administered 2022-04-10: 500 mg via INTRAVENOUS
  Filled 2022-04-10: qty 10

## 2022-04-10 MED ORDER — SODIUM CHLORIDE 0.9 % IV SOLN
750.0000 mg | Freq: Once | INTRAVENOUS | Status: AC
  Administered 2022-04-10: 750 mg via INTRAVENOUS
  Filled 2022-04-10: qty 75

## 2022-04-10 MED ORDER — PALONOSETRON HCL INJECTION 0.25 MG/5ML
0.2500 mg | Freq: Once | INTRAVENOUS | Status: AC
  Administered 2022-04-10: 0.25 mg via INTRAVENOUS
  Filled 2022-04-10: qty 5

## 2022-04-10 MED ORDER — FAMOTIDINE IN NACL 20-0.9 MG/50ML-% IV SOLN
20.0000 mg | Freq: Once | INTRAVENOUS | Status: AC | PRN
  Administered 2022-04-10: 20 mg via INTRAVENOUS

## 2022-04-10 MED ORDER — SODIUM CHLORIDE 0.9 % IV SOLN
175.0000 mg/m2 | Freq: Once | INTRAVENOUS | Status: AC
  Administered 2022-04-10: 372 mg via INTRAVENOUS
  Filled 2022-04-10: qty 62

## 2022-04-10 MED ORDER — SODIUM CHLORIDE 0.9 % IV SOLN
150.0000 mg | Freq: Once | INTRAVENOUS | Status: AC
  Administered 2022-04-10: 150 mg via INTRAVENOUS
  Filled 2022-04-10: qty 150

## 2022-04-10 MED ORDER — SODIUM CHLORIDE 0.9 % IV SOLN: Freq: Once | INTRAVENOUS | Status: AC

## 2022-04-10 MED ORDER — ALTEPLASE 2 MG IJ SOLR
2.0000 mg | Freq: Once | INTRAMUSCULAR | Status: DC
  Filled 2022-04-10: qty 2

## 2022-04-10 MED ORDER — FLUCONAZOLE 100 MG PO TABS: 100.0000 mg | ORAL_TABLET | Freq: Every day | ORAL | 0 refills | Status: DC

## 2022-04-10 MED ORDER — SODIUM CHLORIDE 0.9 % IV SOLN
10.0000 mg | Freq: Once | INTRAVENOUS | Status: AC
  Administered 2022-04-10: 10 mg via INTRAVENOUS
  Filled 2022-04-10: qty 10

## 2022-04-10 MED ORDER — DIPHENHYDRAMINE HCL 50 MG/ML IJ SOLN
50.0000 mg | Freq: Once | INTRAMUSCULAR | Status: AC | PRN
  Administered 2022-04-10: 25 mg via INTRAVENOUS

## 2022-04-10 MED ORDER — SODIUM CHLORIDE 0.9% FLUSH
10.0000 mL | Freq: Once | INTRAVENOUS | Status: AC
  Administered 2022-04-10: 10 mL

## 2022-04-10 MED ORDER — CETIRIZINE HCL 10 MG/ML IV SOLN
10.0000 mg | Freq: Once | INTRAVENOUS | Status: AC
  Administered 2022-04-10: 10 mg via INTRAVENOUS
  Filled 2022-04-10: qty 1

## 2022-04-10 NOTE — Progress Notes (Signed)
Hypersensitivity Reaction note  Date of event: 04/10/22 Time of event: 12:18 Generic name of drug involved: Paclitaxel Name of provider notified of the hypersensitivity reaction: Karie Fetch. PA. / Dr. Bertis Ruddy Was agent that likely caused hypersensitivity reaction added to Allergies List within EMR? yes Chain of events including reaction signs/symptoms, treatment administered, and outcome (e.g., drug resumed; drug discontinued; sent to Emergency Department; etc.) Patient c/o chest pressure, pain, SOB. Taxol stopped, patient placed on 2L O2, NS hung to gravity. Pepcid administered. Patient stated chest pain resolving and now c/o back pain. Solumedrol and Benadryl administered (see MAR for details). Patient stated she felt better, no SOB, no chest pain, no lower back pain. Patient ambulated to bathroom. Paclitaxel discontinued per Jae Dire PA verbal order.  Gerrit Halls, RN 04/10/2022 12:43 PM

## 2022-04-10 NOTE — Progress Notes (Signed)
    DATE:  04/10/22                                        X CHEMO/IMMUNOTHERAPY REACTION           MD: Bertis Ruddy   AGENT/BLOOD PRODUCT RECEIVING TODAY:             Paclitaxel, Jemperli, Paraplatin   AGENT/BLOOD PRODUCT RECEIVING IMMEDIATELY PRIOR TO REACTION:          Taxol   VS: BP:     141/75   P:       70       SPO2:       100% 2L                BP:     132/81   P:       83       SPO2:       100 % RA     REACTION(S):           chest pressure, shortness of breath, back pain   PREMEDS:     Decadron 10 mg IV, Emend 150 mg IV, Aloxi 0.25 mg IV, Cetrizine 10 mg IV   INTERVENTION: Solu-medrol 62.5 mg IV, benadryl 25 mg IV, pepcid 20 mg IV   Review of Systems  Review of Systems  Respiratory:  Positive for shortness of breath.   Cardiovascular:  Positive for chest pain.  Musculoskeletal:  Positive for back pain.  All other systems reviewed and are negative.    Physical Exam  Physical Exam Vitals and nursing note reviewed.  Constitutional:      General: She is in acute distress.     Appearance: She is well-developed. She is not ill-appearing or toxic-appearing.  HENT:     Head: Normocephalic.  Eyes:     Conjunctiva/sclera: Conjunctivae normal.  Cardiovascular:     Rate and Rhythm: Normal rate and regular rhythm.     Pulses: Normal pulses.     Heart sounds: Normal heart sounds.  Pulmonary:     Breath sounds: Normal breath sounds.     Comments: tachypneic Abdominal:     General: There is no distension.  Musculoskeletal:     Cervical back: Normal range of motion.  Skin:    General: Skin is warm and dry.  Neurological:     Mental Status: She is oriented to person, place, and time.     OUTCOME:            Patient with severe taxol reaction a few minutes into infusion. Emergency medications given as documented above. Patient returned to baseline. Dr. Bertis Ruddy notified of reaction and plans to discontinue Taxol and change to Taxotere for future treatments.

## 2022-04-10 NOTE — Progress Notes (Signed)
Error. Please see office note from same day.

## 2022-04-10 NOTE — Patient Instructions (Signed)
Instrucciones al darle de alta: Discharge Instructions Gracias por elegir al [facility] para brindarle atencin mdica de oncologa y hematologa.   Si usted tiene una cita de laboratorio con el [facility], por favor vaya directamente al [facility] y regstrese en el rea de registro.   Use ropa cmoda y adecuada para tener fcil acceso a las vas del Portacath (acceso venoso de larga duracin) o la lnea PICC (catter central colocado por va perifrica).   Nos esforzamos por ofrecerle tiempo de calidad con su proveedor. Es posible que tenga que volver a programar su cita si llega tarde (15 minutos o ms).  El llegar tarde le afecta a usted y a otros pacientes cuyas citas son posteriores a la suya.  Adems, si usted falta a tres o ms citas sin avisar a la oficina, puede ser retirado(a) de la clnica a discrecin del proveedor.      Para las solicitudes de renovacin de recetas, pida a su farmacia que se ponga en contacto con nuestra oficina y deje que transcurran 72 horas para que se complete el proceso de las renovaciones.    Hoy usted recibi los siguientes agentes de quimioterapia e/o inmunoterapia Jemperli, Taxol, Carboplatin      Para ayudar a prevenir las nuseas y los vmitos despus de su tratamiento, le recomendamos que tome su medicamento para las nuseas segn las indicaciones.  LOS SNTOMAS QUE DEBEN COMUNICARSE INMEDIATAMENTE SE INDICAN A CONTINUACIN: *FIEBRE SUPERIOR A 100.4 F (38 C) O MS *ESCALOFROS O SUDORACIN *NUSEAS Y VMITOS QUE NO SE CONTROLAN CON EL MEDICAMENTO PARA LAS NUSEAS *DIFICULTAD INUSUAL PARA RESPIRAR  *MORETONES O HEMORRAGIAS NO HABITUALES *PROBLEMAS URINARIOS (dolor o ardor al orinar o frecuencia para orinar) *PROBLEMAS INTESTINALES (diarrea inusual, estreimiento, dolor cerca del ano) SENSIBILIDAD EN LA BOCA Y EN LA GARGANTA CON O SIN LA PRESENCIA DE LCERAS (dolor de garganta, llagas en la boca o dolor de  muelas/dientes) ERUPCIN, HINCHAZN O DOLORES INUSUALES FLUJO VAGINAL INUSUAL O PICAZN/RASQUIA    Los puntos marcados con un asterisco ( *) indican una posible emergencia y debe hacer un seguimiento tan pronto como le sea posible o vaya al Departamento de Emergencias si se le presenta algn problema.  Por favor, muestre la TARJETA DE ADVERTENCIA DE QUIMIOTERAPIA O LA TARJETA DE ADVERTENCIA DE INMUNOTERAPIA al registrarse en el Departamento de Emergencias y a la enfermera de triaje.  Si tiene preguntas despus de su visita o necesita cancelar o volver a programar su cita, por favor pngase en contacto con [facility]  Dept: [phone] y siga las instrucciones. Las horas de oficina son de 8:00 a.m. a 4:30 p.m. de lunes a viernes. Por favor, tenga en cuenta que los mensajes de voz que se dejan despus de las 4:00 p.m. posiblemente no se devolvern hasta el siguiente da de trabajo.  Cerramos los fines de semana y los das festivos importantes. En todo momento tiene acceso a una enfermera para preguntas urgentes. Por favor, llame al nmero principal de la clnica Dept: [phone] y siga las instrucciones.   Para cualquier pregunta que no sea de carcter urgente, tambin puede ponerse en contacto con su proveedor utilizando MyChart. Ahora ofrecemos visitas electrnicas para cualquier persona mayor de 18 aos que solicite atencin mdica en lnea para los sntomas que no sean urgentes. Para ms detalles vaya a mychart..com.   Tambin puede bajar la aplicacin de MyChart! Vaya a la tienda de aplicaciones, busque "MyChart",   abra la aplicacin, seleccione [place], e ingrese con su nombre de usuario y la contrasea de MyChart.  Dostarlimab Injection Qu es este medicamento? El DOSTARLIMAB trata algunos tipos de cncer. Acta ayudando al sistema inmunolgico a desacelerar o detener la propagacin de las clulas cancerosas. Es un anticuerpo  monoclonal. Este medicamento puede ser utilizado para otros usos; si tiene alguna pregunta consulte con su proveedor de atencin mdica o con su farmacutico. MARCAS COMUNES: Jemperli Qu le debo informar a mi profesional de la salud antes de tomar este medicamento? Necesitan saber si usted presenta alguno de los siguientes problemas o situaciones: Trasplante alognico de clulas madre (se usan las clulas madre de otra persona) Enfermedades autoinmunes, tales como enfermedad de Crohn, colitis ulcerativa, lupus Antecedentes de radiacin en el pecho Problemas del sistema nervioso, tales como sndrome de Guillain-Barr, miastenia grave Trasplante de rganos Una reaccin alrgica o inusual al dostarlimab, a otros medicamentos, alimentos, colorantes o conservantes Si est embarazada o buscando quedar embarazada Si est amamantando a un beb Cmo debo utilizar este medicamento? Este medicamento se inyecta en una vena. Su equipo de atencin lo administra en un hospital o en un entorno clnico. Se le entregar una Gua del medicamento (MedGuide, su nombre en ingls) especial antes de cada tratamiento. Asegrese de leer esta informacin cada vez cuidadosamente. Hable con su equipo de atencin sobre el uso de este medicamento en nios. Puede requerir atencin especial. Sobredosis: Pngase en contacto inmediatamente con un centro toxicolgico o una sala de urgencia si usted cree que haya tomado demasiado medicamento. ATENCIN: Este medicamento es solo para usted. No comparta este medicamento con nadie. Qu sucede si me olvido de una dosis? Cumpla con las citas para dosis de seguimiento. Es importante no olvidar ninguna dosis. Llame a su equipo de atencin si no puede asistir a una cita. Qu puede interactuar con este medicamento? No se han estudiado las interacciones. Puede ser que esta lista no menciona todas las posibles interacciones. Informe a su profesional de la salud de todos los productos a  base de hierbas, medicamentos de venta libre o suplementos nutritivos que est tomando. Si usted fuma, consume bebidas alcohlicas o si utiliza drogas ilegales, indqueselo tambin a su profesional de la salud. Algunas sustancias pueden interactuar con su medicamento. A qu debo estar atento al usar este medicamento? Se supervisar su estado de salud atentamente mientras reciba este medicamento. Usted podra necesitar realizarse anlisis de sangre mientras est usando este medicamento. Este medicamento podra causar reacciones graves en la piel. Pueden presentarse semanas a meses despus de comenzar a usar el medicamento. Contacte a su equipo de atencin de inmediato si nota que tiene fiebre o sntomas gripales con una erupcin. La erupcin puede ser roja o morada, y luego puede convertirse en ampollas o descamacin de la piel. Tambin podra observar una erupcin roja con hinchazn en la cara, los labios o los ganglios linfticos en el cuello o debajo de los brazos. Si observa algn cambio en la vista, infrmelo de inmediato a su equipo de atencin. Hable con su equipo de atencin si podra estar embarazada. Este medicamento puede causar defectos congnitos graves si se usa durante el embarazo y por 4 meses despus de la ltima dosis. Deber realizarse una prueba de embarazo y obtener resultado negativo antes de comenzar a usar este medicamento. Se recomienda utilizar un mtodo anticonceptivo mientras est usando este medicamento y por 4 meses despus de la ltima dosis. Su equipo de atencin mdica puede ayudarle a encontrar la opcin que   mejor se adapte a sus necesidades. No debe amamantar a un beb mientras usa este medicamento y por 4 meses despus de la ltima dosis. Qu efectos secundarios puedo tener al utilizar este medicamento? Efectos secundarios que debe informar a su equipo de atencin tan pronto como sea posible: Reacciones alrgicas: erupcin cutnea, comezn/picazn, urticaria, hinchazn  de la cara, los labios, la lengua o la garganta Tos seca, falta de aire o problemas para respirar Dolor, enrojecimiento, irritacin o secrecin de los ojos con visin borrosa o disminuida Inflamacin del msculo cardiaco: debilidad o fatiga inusuales, falta de aire, dolor en el pecho, frecuencia cardaca rpida o irregular, mareos, hinchazn de los tobillos, los pies o las manos. Problemas de las glndulas hormonales: dolor de cabeza, sensibilidad a la luz, debilidad o fatiga inusuales, mareos, ritmo cardaco rpido o irregular, aumento de la sensibilidad al fro o al calor, sudoracin excesiva, estreimiento, cada del cabello, aumento de la sed o de la cantidad de orina, temblores o sacudidas, irritabilidad Reacciones a la infusin: dolor en el pecho, falta de aire o dificultad para respirar, sensacin de desmayo o aturdimiento Lesin renal (glomerulonefritis): disminucin de la cantidad de orina, orina roja o marrn oscura, orina con espuma o burbujas, hinchazn de los tobillos, las manos o los pies Lesin en el hgado: dolor en la regin abdominal superior derecha, prdida de apetito, nuseas, heces de color claro, orina amarilla oscura o marrn, color amarillento de los ojos o la piel, debilidad o fatiga inusuales Dolor, hormigueo o entumecimiento en las manos o los pies, debilidad muscular, cambios en la visin, confusin o dificultad para hablar, prdida del equilibrio o la coordinacin, dificultad para caminar, convulsiones Erupcin, fiebre y ganglios linfticos inflamados Enrojecimiento, formacin de ampollas, descamacin o distensin de la piel, incluso dentro de la boca Dolor de estmago repentino o intenso, diarrea con sangre, fiebre, nuseas, vmitos Efectos secundarios que generalmente no requieren atencin mdica (debe informarlos a su equipo de atencin si persisten o si son molestos): Dolores en los huesos, las articulaciones o los msculos Diarrea Fatiga Prdida del  apetito Nuseas Erupcin cutnea Puede ser que esta lista no menciona todos los posibles efectos secundarios. Comunquese a su mdico por asesoramiento mdico sobre los efectos secundarios. Usted puede informar los efectos secundarios a la FDA por telfono al 1-800-FDA-1088. Dnde debo guardar mi medicina? Este medicamento se administra en hospitales o clnicas. No se guarda en su casa. ATENCIN: Este folleto es un resumen. Puede ser que no cubra toda la posible informacin. Si usted tiene preguntas acerca de esta medicina, consulte con su mdico, su farmacutico o su profesional de la salud.  2023 Elsevier/Gold Standard (2021-11-12 00:00:00)  Paclitaxel Injection Qu es este medicamento? El PACLITAXEL trata algunos tipos de cncer. Acta desacelerando el crecimiento de clulas cancerosas. Este medicamento puede ser utilizado para otros usos; si tiene alguna pregunta consulte con su proveedor de atencin mdica o con su farmacutico. MARCAS COMUNES: Onxol, Taxol Qu le debo informar a mi profesional de la salud antes de tomar este medicamento? Necesitan saber si usted presenta alguno de los siguientes problemas o situaciones: Enfermedad cardiaca Enfermedad heptica Niveles bajos de glbulos blancos Una reaccin alrgica o inusual al paclitaxel, a otros medicamentos, alimentos, colorantes o conservantes Si usted o su pareja est embarazada o intentando quedar embarazada Si est amamantando a un beb Cmo debo utilizar este medicamento? Este medicamento se inyecta en una vena. Su equipo de atencin lo administra en un hospital o en un entorno clnico. Hable con su equipo de atencin   sobre el uso de este medicamento en nios. Aunque este medicamento se puede administrar a nios con ciertas afecciones, existen precauciones que deben tomarse. Sobredosis: Pngase en contacto inmediatamente con un centro toxicolgico o una sala de urgencia si usted cree que haya tomado demasiado  medicamento. ATENCIN: Este medicamento es solo para usted. No comparta este medicamento con nadie. Qu sucede si me olvido de una dosis? Cumpla con las citas para dosis de seguimiento. Es importante no olvidar ninguna dosis. Llame a su equipo de atencin si no puede asistir a una cita. Qu puede interactuar con este medicamento? No use este medicamento con ninguno de los siguientes productos: Vacunas de virus vivos Otros medicamentos podran afectar la forma en que funciona este medicamento. Hable con su equipo de atencin sobre todos los medicamentos que usa. Es posible que le sugieran cambios en su plan de tratamiento para reducir el riesgo de efectos secundarios y asegurarse de que los medicamentos actan del modo previsto. Puede ser que esta lista no menciona todas las posibles interacciones. Informe a su profesional de la salud de todos los productos a base de hierbas, medicamentos de venta libre o suplementos nutritivos que est tomando. Si usted fuma, consume bebidas alcohlicas o si utiliza drogas ilegales, indqueselo tambin a su profesional de la salud. Algunas sustancias pueden interactuar con su medicamento. A qu debo estar atento al usar este medicamento? Se supervisar su estado de salud atentamente mientras reciba este medicamento. Usted podra necesitar realizarse anlisis de sangre mientras est usando este medicamento. Este medicamento podra hacerle sentir un malestar general. Esto no es inusual, ya que la quimioterapia puede afectar tanto a las clulas sanas como a las clulas cancerosas. Si presenta algn efecto secundario, infrmelo. Contine con el tratamiento incluso si se siente enfermo, a menos que su equipo de atencin le indique que lo suspenda. Este medicamento puede causar reacciones alrgicas graves. Para reducir el riesgo, su equipo de atencin puede darle otros medicamentos que deber usar antes de recibir este. Asegrese de seguir las instrucciones de su equipo de  atencin. Este medicamento puede aumentar su riesgo de contraer una infeccin. Llame para pedir consejo a su equipo de atencin si tiene fiebre, escalofros, dolor de garganta o cualquier otro sntoma de resfriado o gripe. No se trate usted mismo. Trate de no acercarse a personas que estn enfermas. Este medicamento podra aumentar el riesgo de moretones o sangrado. Llame a su equipo de atencin si observa sangrados inusuales. Proceda con cuidado al cepillar sus dientes, usar hilo dental o utilizar palillos para los dientes, ya que podra contraer una infeccin o sangrar con mayor facilidad. Si recibe algn tratamiento dental, informe a su dentista que est usando este medicamento. Hable con su equipo de atencin si podra estar embarazada. Este medicamento puede causar defectos congnitos graves si se usa durante el embarazo. Hable con su equipo de atencin antes de amamantar. Es posible que sea necesario realizar cambios en su plan de tratamiento. Qu efectos secundarios puedo tener al utilizar este medicamento? Efectos secundarios que debe informar a su equipo de atencin tan pronto como sea posible: Reacciones alrgicas: erupcin cutnea, comezn/picazn, urticaria, hinchazn de la cara, los labios, la lengua o la garganta Cambios en el ritmo cardiaco: frecuencia cardiaca rpida o irregular, mareos, sensacin de desmayo o aturdimiento, dolor en el pecho, dificultad para respirar Aumento de la presin arterial Infeccin: fiebre, escalofros, tos, dolor de garganta, heridas que no sanan, dolor o problemas para orinar, sensacin general de molestia o malestar Presin arterial baja: mareo,   sensacin de desmayo o aturdimiento, visin borrosa Recuento bajo de glbulos rojos: debilidad o fatiga inusuales, mareo, dolor de cabeza, dificultad para respirar Hinchazn dolorosa, calor o enrojecimiento de la piel, ampollas o llagas en el sitio de la infusin Dolor, hormigueo o entumecimiento de las manos o los  pies Frecuencia cardiaca lenta: mareos, sensacin de desmayo o aturdimiento, confusin, problemas para respirar, debilidad o fatiga inusuales Sangrado o moretones inusuales Efectos secundarios que generalmente no requieren atencin mdica (debe informarlos a su equipo de atencin si persisten o si son molestos): Diarrea Cada del cabello Dolor en las articulaciones Prdida del apetito Dolor muscular Nuseas Vmito Puede ser que esta lista no menciona todos los posibles efectos secundarios. Comunquese a su mdico por asesoramiento mdico sobre los efectos secundarios. Usted puede informar los efectos secundarios a la FDA por telfono al 1-800-FDA-1088. Dnde debo guardar mi medicina? Este medicamento se administra en hospitales o clnicas. No se guarda en su casa. ATENCIN: Este folleto es un resumen. Puede ser que no cubra toda la posible informacin. Si usted tiene preguntas acerca de esta medicina, consulte con su mdico, su farmacutico o su profesional de la salud.  2023 Elsevier/Gold Standard (2021-11-12 00:00:00)  Carboplatin Injection Qu es este medicamento? El CARBOPLATINO trata algunos tipos de cncer. Acta desacelerando el crecimiento de clulas cancerosas. Este medicamento puede ser utilizado para otros usos; si tiene alguna pregunta consulte con su proveedor de atencin mdica o con su farmacutico. MARCAS COMUNES: Paraplatin Qu le debo informar a mi profesional de la salud antes de tomar este medicamento? Necesitan saber si usted presenta alguno de los siguientes problemas o situaciones: Trastornos sanguneos Problemas auditivos Enfermedad renal Terapia de radiacin en curso o reciente Una reaccin alrgica o inusual al carboplatino, al cisplatino, a otros medicamentos, alimentos, colorantes o conservantes Si est embarazada o buscando quedar embarazada Si est amamantando a un beb Cmo debo utilizar este medicamento? Este medicamento se inyecta en una vena. Su  equipo de atencin lo administra en un hospital o en un entorno clnico. Hable con su equipo de atencin sobre el uso de este medicamento en nios. Puede requerir atencin especial. Sobredosis: Pngase en contacto inmediatamente con un centro toxicolgico o una sala de urgencia si usted cree que haya tomado demasiado medicamento. ATENCIN: Este medicamento es solo para usted. No comparta este medicamento con nadie. Qu sucede si me olvido de una dosis? Cumpla con las citas para dosis de seguimiento. Es importante no olvidar ninguna dosis. Llame a su equipo de atencin si no puede asistir a una cita. Qu puede interactuar con este medicamento? Medicamentos para convulsiones Algunos antibiticos, tales como amikacina, gentamicina, neomicina, estreptomicina, tobramicina Vacunas Puede ser que esta lista no menciona todas las posibles interacciones. Informe a su profesional de la salud de todos los productos a base de hierbas, medicamentos de venta libre o suplementos nutritivos que est tomando. Si usted fuma, consume bebidas alcohlicas o si utiliza drogas ilegales, indqueselo tambin a su profesional de la salud. Algunas sustancias pueden interactuar con su medicamento. A qu debo estar atento al usar este medicamento? Se supervisar su estado de salud atentamente mientras reciba este medicamento. Usted podra necesitar realizarse anlisis de sangre mientras est usando este medicamento. Este medicamento podra hacerle sentir un malestar general. Esto es normal, ya que la quimioterapia puede afectar tanto a las clulas sanas como a las clulas cancerosas. Si presenta algn efecto secundario, infrmelo. Contine con el tratamiento incluso si se siente enfermo, a menos que su equipo de atencin le indique que   lo suspenda. En algunos casos, podra recibir medicamentos adicionales para ayudarle con los efectos secundarios. Siga todas las instrucciones para usarlos. Este medicamento puede aumentar su  riesgo de contraer una infeccin. Llame para pedir consejo a su equipo de atencin si tiene fiebre, escalofros, dolor de garganta o cualquier otro sntoma de resfriado o gripe. No se trate usted mismo. Trate de no acercarse a personas que estn enfermas. Evite usar medicamentos que contengan aspirina, acetaminofeno, ibuprofeno, naproxeno o ketoprofeno, a menos que as lo indique su equipo de atencin. Estos medicamentos pueden ocultar la fiebre. Proceda con cuidado al cepillar sus dientes, usar hilo dental o utilizar palillos para los dientes, ya que podra contraer una infeccin o sangrar con mayor facilidad. Si recibe algn tratamiento dental, informe a su dentista que est usando este medicamento. Informe a su equipo de atencin si est buscando un embarazo o si cree que podra estar embarazada. Este medicamento puede causar defectos congnitos graves. Hable con su equipo de atencin sobre mtodos anticonceptivos eficaces. No debe amamantar a un beb mientras est usando este medicamento. Qu efectos secundarios puedo tener al utilizar este medicamento? Efectos secundarios que debe informar a su equipo de atencin tan pronto como sea posible: Reacciones alrgicas: erupcin cutnea, comezn/picazn, urticaria, hinchazn de la cara, los labios, la lengua o la garganta Infeccin: fiebre, escalofros, tos, dolor de garganta, heridas que no sanan, dolor o problemas para orinar, sensacin general de molestia o malestar Recuento bajo de glbulos rojos: debilidad o fatiga inusuales, mareo, dolor de cabeza, dificultad para respirar Dolor, hormigueo o entumecimiento en las manos o los pies, debilidad muscular, cambios en la visin, confusin o dificultad para hablar, prdida del equilibrio o la coordinacin, dificultad para caminar, convulsiones Sangrado o moretones inusuales Efectos secundarios que generalmente no requieren atencin mdica (debe informarlos a su equipo de atencin si persisten o si son  molestos): Cada del cabello Nuseas Debilidad o fatiga inusuales Vmito Puede ser que esta lista no menciona todos los posibles efectos secundarios. Comunquese a su mdico por asesoramiento mdico sobre los efectos secundarios. Usted puede informar los efectos secundarios a la FDA por telfono al 1-800-FDA-1088. Dnde debo guardar mi medicina? Este medicamento se administra en hospitales o clnicas. No se guarda en su casa. ATENCIN: Este folleto es un resumen. Puede ser que no cubra toda la posible informacin. Si usted tiene preguntas acerca de esta medicina, consulte con su mdico, su farmacutico o su profesional de la salud.  2023 Elsevier/Gold Standard (2021-11-12 00:00:00)     

## 2022-04-11 ENCOUNTER — Other Ambulatory Visit: Payer: Self-pay

## 2022-04-12 ENCOUNTER — Encounter: Payer: Self-pay | Admitting: Hematology and Oncology

## 2022-04-12 DIAGNOSIS — L708 Other acne: Secondary | ICD-10-CM | POA: Insufficient documentation

## 2022-04-12 DIAGNOSIS — B372 Candidiasis of skin and nail: Secondary | ICD-10-CM | POA: Insufficient documentation

## 2022-04-12 NOTE — Progress Notes (Signed)
[facility] OFFICE PROGRESS NOTE  Patient Care Team: Patient, No Pcp Per as PCP - General (General Practice)  ASSESSMENT & PLAN:  Endometrial cancer (HCC) She developed allergic reaction to taxol, symptoms resolved I will switch out future treatment by substituting with taxotere  Yeast infection of the skin She yeast infection on skin folds I recommend a course of fluconazole  Acneiform rash The acneiform rash on her scalp could be due to steroids I recommend topical metrogel  No orders of the defined types were placed in this encounter.   All questions were answered. The patient knows to call the clinic with any problems, questions or concerns. The total time spent in the appointment was 40 minutes encounter with patients including review of chart and various tests results, discussions about plan of care and coordination of care plan   Artis Delay, MD 04/12/2022 1:48 PM  INTERVAL HISTORY: Please see below for problem oriented charting. she returns for treatment follow-up She had recent port revision She developed acne on her scalp and a different rash on her back and skin folds She had reaction to taxol No neuropathy  REVIEW OF SYSTEMS:   Constitutional: Denies fevers, chills or abnormal weight loss Eyes: Denies blurriness of vision Ears, nose, mouth, throat, and face: Denies mucositis or sore throat Respiratory: Denies cough, dyspnea or wheezes Cardiovascular: Denies palpitation, chest discomfort or lower extremity swelling Gastrointestinal:  Denies nausea, heartburn or change in bowel habits Lymphatics: Denies new lymphadenopathy or easy bruising Neurological:Denies numbness, tingling or new weaknesses Behavioral/Psych: Mood is stable, no new changes  All other systems were reviewed with the patient and are negative.  I have reviewed the past medical history, past surgical history, social history and family history with the patient and they are unchanged  from previous note.  ALLERGIES:  is allergic to paclitaxel and advil [ibuprofen].  MEDICATIONS:  Current Outpatient Medications  Medication Sig Dispense Refill   fluconazole (DIFLUCAN) 100 MG tablet Take 1 tablet (100 mg total) by mouth daily. 7 tablet 0   metroNIDAZOLE (METROGEL) 1 % gel Apply topically daily. Apply to her scalp 45 g 0   APIXABAN (ELIQUIS) VTE STARTER PACK (10MG  AND 5MG ) Take as directed on package: start with two-5mg  tablets twice daily for 7 days. On day 8, switch to one-5mg  tablet twice daily. 74 each 0   dexamethasone (DECADRON) 4 MG tablet Take 2 tabs at the night before and 2 tab the morning of chemotherapy, every 3 weeks, by mouth x 6 cycles 24 tablet 6   lidocaine-prilocaine (EMLA) cream Apply to affected area once 30 g 3   ondansetron (ZOFRAN) 8 MG tablet Take 1 tablet (8 mg total) by mouth every 8 (eight) hours as needed for nausea or vomiting. Start on the third day after chemotherapy. 30 tablet 1   prochlorperazine (COMPAZINE) 10 MG tablet Take 1 tablet (10 mg total) by mouth every 6 (six) hours as needed for nausea or vomiting. 30 tablet 1   No current facility-administered medications for this visit.    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 Endometrial thickening possibly due to obstruction from the cystic mass. Direct visualization is recommended.   Enlarged right para-aortic and right external iliac nodes are indeterminate but concerning for metastasis given cervical lesion.   Borderline enlargement and fecalization  of the distal ileum. No definite transition point. Findings suggestive of slow transit/constipation.   01/14/2022 Initial Biopsy   Endometrial biopsy and ECC: A. ENDOCERVIX, CURETTAGE: - BENIGN ENDOCERVICAL MUCOSA  WITH ACUTE AND CHRONIC INFLAMMATION - NEGATIVE FOR MALIGNANCY  B. 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  The tumor cells are partially positive for p16. The p53 stain shows foci with wild-type staining and foci with overexpression.  The tumor cells show patchy positivity for ER and PR and are negative for Napsin A.  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  Drs. Arthur Holms reviewed the case and agree with the above diagnosis.  Clinical correlation recommended.    01/14/2022 Initial Diagnosis   Endometrial cancer (HCC)   01/24/2022 Imaging   MR pelvis 1. There is an enhancing mass identified within the lower uterine segment which measures 4.3 x 3.9 by 4.7 cm. This invades the anterior myometrium within the lower uterine segment. This appears to terminate at the level of the internal os of the cervix. Findings are compatible with endometrial carcinoma. 2. Aortocaval, right common iliac, and bilateral pelvic sidewall lymph nodes are enlarged compatible with metastatic adenopathy. Given the presence of periaortic and pelvic node involvement imaging findings are compatible with stage IIIC2 disease.   02/06/2022 PET scan   1. Diffusely increased uptake within the endometrium and cervix compatible with known endometrial carcinoma. 2. Tracer avid retroperitoneal and pelvic lymph nodes compatible with nodal metastasis. 3. No signs of solid organ metastasis or distant metastatic disease. 4. Nonspecific, diffuse increased bone marrow uptake is noted within the axial and appendicular skeleton. No focal areas of increased uptake to suggest osseous metastasis. 5. Multifocal bilateral areas of fibrosis, architectural  distortion and scarring within both lungs which is favored to represent a inflammatory or infectious process. Findings may be the sequelae of prior atypical viral infection.   02/10/2022 Pathology Results   A. RIGHT AORTOCAVAL LYMPH NODE, EXCISION: Two benign lymph nodes, negative for carcinoma (0/2)  B. RIGHT PELVIC LYMPH NODE, EXCISION: Two of two lymph nodes with metastatic adenocarcinoma (2/2)  C. LEFT OBTURATOR PELVIC LYMPH NODE, EXCISION: Three lymph nodes, negative for carcinoma (0/3)  D. 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 RIGHT POSTERIOR CERVICAL MARGIN, EXCISION: Benign cervical stroma Negative for carcinoma  ONCOLOGY TABLE:  UTERUS, CARCINOMA OR CARCINOSARCOMA: Resection  Procedure: Total hysterectomy and bilateral salpingo-oophorectomy with node sampling Histologic Type: Endometrioid adenocarcinoma Histologic Grade: High-grade, FIGO 3 Myometrial Invasion:      Depth of Myometrial Invasion (mm): 10 mm      Myometrial Thickness (mm): 33 mm      Percentage of Myometrial Invasion: 30% Uterine Serosa Involvement: Not identified Cervical stromal Involvement: Not identified Extent of involvement of other tissue/organs: Not identified Peritoneal/Ascitic Fluid: []  Lymphovascular Invasion: Present Regional  Lymph Nodes:      Pelvic Lymph Nodes Examined: 5 nonsentinel lymph nodes       Pelvic Lymph Nodes with Metastasis: 2          Macrometastasis: (>2.0 mm): 2          Micrometastasis: (>0.2 mm and < 2.0 mm): 0          Isolated Tumor Cells (<0.2 mm): 0          Laterality of Lymph Node with Tumor: Right          Extracapsular Extension: Not identified      Para-aortic Lymph Nodes Examined: 2 nonsentinel lymph nodes       Para-aortic Lymph Nodes with Metastasis: 0          Macrometastasis: (>2.0 mm): 0          Micrometastasis: (>0.2 mm and < 2.0 mm): 0          Isolated Tumor Cells (<0.2 mm): 0 Distant Metastasis: Not applicable Pathologic Stage Classification (pTNM, AJCC 8th Edition): pT1a, pN1a  Ancillary Studies: MMR testing has been ordered and the results will be issued within an addendum to this report.    02/10/2022 Surgery   Preoperative Diagnosis: Endometrioid endometrial cancer FIGO grade 2, Morbid obesity (BMI 44)    Procedures: Robotic-assisted total laparoscopic hysterectomy, bilateral salpingo-oophorectomy, bilateral sentinel lymph node injection and debulking of enlarge, PET avid pelvic and para-aortic lymph nodes (Modifer 22: extreme morbid obesity, BMI 44, with significant retroperitoneal and intraperitoneal adiposity requiring additional OR personnel for positioning and retraction. Obesity made retroperitoneal visualization limited, increasing the complexity of the case and necessitating additional instrumentation for retraction and to create safe exposure. Obesity related complexity increased the duration of the procedure by 60 minutes.)   Surgeon: Clide CliffMeredith Newton, MD    Findings: Normal upper abdominal survey with normal liver surface and diaphragm. Normal appearing small and large bowel. Globally enlarged uterus. Overall normal tubes, and ovaries, small paratubal cyst. No evidence of peritoneal disease, ascites, or carcinomatosis. Enlarged bilateral pelvic and right para-aortic lymph nodes removed. Tumor spill into the vagina and pelvis noted with  colpotomy. Pelvis and vagina copiously irrigated. Significant intra-abdominal adiposity.   02/23/2022 Cancer Staging   Staging form: Corpus Uteri - Carcinoma and Carcinosarcoma, AJCC 8th Edition - Pathologic stage from 02/23/2022: Stage IIIC1 (pT1a, pN1, cM0) - Signed by Artis DelayGorsuch, Labron Bloodgood, MD on 02/23/2022 Stage prefix: Initial diagnosis   03/05/2022 Procedure   Status post right IJ port catheter placement.    03/20/2022 -  Chemotherapy   Patient is on Treatment Plan : UTERINE ENDOMETRIAL Dostarlimab-gxly (500 mg) + Carboplatin (AUC 5) + Paclitaxel (175 mg/m2) q21d x 6 cycles / Dostarlimab-gxly (1000 mg) q42d x 6 cycles      04/01/2022 Genetic Testing   Negative genetic testing on the CancerNext-Expanded+RNAinsight panel.  MSH3 VUS identified.  The report date is April 01, 2022.  The CancerNext-Expanded gene panel offered by [facility] and includes sequencing and rearrangement analysis for the following 77 genes: AIP, ALK, APC*, ATM*, AXIN2, BAP1, BARD1, BMPR1A, BRCA1*, BRCA2*, BRIP1*, CDC73, CDH1*, CDK4, CDKN1B, CDKN2A, CHEK2*, CTNNA1, DICER1, FH, FLCN, KIF1B, LZTR1, MAX, MEN1, MET, MLH1*, MSH2*, MSH3, MSH6*, MUTYH*, NF1*, NF2, NTHL1, PALB2*, PHOX2B, PMS2*, POT1, PRKAR1A, PTCH1, PTEN*, RAD51C*, RAD51D*, RB1, RET, SDHA, SDHAF2, SDHB, SDHC, SDHD, SMAD4, SMARCA4, SMARCB1, SMARCE1, STK11, SUFU, TMEM127, TP53*, TSC1, TSC2, and VHL (sequencing and deletion/duplication); EGFR, EGLN1, HOXB13, KIT, MITF, PDGFRA, POLD1, and POLE (sequencing only); EPCAM and GREM1 (deletion/duplication only).  DNA and RNA analyses performed for * genes.      PHYSICAL EXAMINATION: ECOG PERFORMANCE STATUS: 1 - Symptomatic but completely ambulatory  Vitals:   04/10/22 1019  BP: 128/72  Pulse: 88  Resp: 18  Temp: 97.7 F (36.5 C)  SpO2: 100%   Filed Weights   04/10/22 1019  Weight: 226 lb 12.8 oz (102.9 kg)    GENERAL:alert, no distress and comfortable SKIN: she has acneiform rash on her scalp and different rash on  skin folds  EYES: normal, Conjunctiva are pink and non-injected, sclera clear OROPHARYNX:no exudate, no erythema and lips, buccal mucosa, and tongue normal  NECK: supple, thyroid normal size, non-tender, without nodularity LYMPH:  no palpable lymphadenopathy in the cervical, axillary or inguinal LUNGS: clear to auscultation and percussion with normal breathing effort HEART: regular rate & rhythm and no murmurs and no lower extremity edema ABDOMEN:abdomen soft, non-tender and normal bowel sounds Musculoskeletal:no cyanosis of digits and no clubbing  NEURO: alert & oriented x 3 with fluent speech, no focal motor/sensory deficits  LABORATORY DATA:  I have reviewed the data as listed    Component Value Date/Time   NA 137 04/10/2022 0933   K 4.0 04/10/2022 0933   CL 102 04/10/2022 0933   CO2 29 04/10/2022 0933   GLUCOSE 242 (H) 04/10/2022 0933   BUN 9 04/10/2022 0933   CREATININE 0.79 04/10/2022 0933   CALCIUM 9.9 04/10/2022 0933   PROT 7.8 04/10/2022 0933   ALBUMIN 4.0 04/10/2022 0933   AST 28 04/10/2022 0933   ALT 37 04/10/2022 0933   ALKPHOS 78 04/10/2022 0933   BILITOT 0.4 04/10/2022 0933   GFRNONAA >60 04/10/2022 0933   GFRAA >90 12/13/2011 2039    No results found for: "SPEP", "UPEP"  Lab Results  Component Value Date   WBC 10.1 04/10/2022   NEUTROABS 8.3 (H) 04/10/2022   HGB 11.8 (L) 04/10/2022   HCT 39.0 04/10/2022   MCV 76.2 (L) 04/10/2022   PLT 334 04/10/2022      Chemistry      Component Value Date/Time   NA 137 04/10/2022 0933   K 4.0 04/10/2022 0933   CL 102 04/10/2022 0933   CO2 29 04/10/2022 0933   BUN 9 04/10/2022 0933   CREATININE 0.79 04/10/2022 0933      Component Value Date/Time   CALCIUM 9.9 04/10/2022 0933   ALKPHOS 78 04/10/2022 0933   AST 28 04/10/2022 0933   ALT 37 04/10/2022 0933   BILITOT 0.4 04/10/2022 0933

## 2022-04-12 NOTE — Assessment & Plan Note (Signed)
The acneiform rash on her scalp could be due to steroids I recommend topical metrogel

## 2022-04-12 NOTE — Assessment & Plan Note (Signed)
She developed allergic reaction to taxol, symptoms resolved I will switch out future treatment by substituting with taxotere

## 2022-04-12 NOTE — Assessment & Plan Note (Signed)
She yeast infection on skin folds I recommend a course of fluconazole

## 2022-04-30 MED FILL — Fosaprepitant Dimeglumine For IV Infusion 150 MG (Base Eq): INTRAVENOUS | Qty: 5 | Status: AC

## 2022-04-30 MED FILL — Dexamethasone Sodium Phosphate Inj 100 MG/10ML: INTRAMUSCULAR | Qty: 1 | Status: AC

## 2022-05-01 ENCOUNTER — Other Ambulatory Visit: Payer: Self-pay | Admitting: Hematology and Oncology

## 2022-05-01 ENCOUNTER — Encounter: Payer: Self-pay | Admitting: Hematology and Oncology

## 2022-05-01 ENCOUNTER — Inpatient Hospital Stay (HOSPITAL_BASED_OUTPATIENT_CLINIC_OR_DEPARTMENT_OTHER): Payer: Self-pay | Admitting: Hematology and Oncology

## 2022-05-01 ENCOUNTER — Other Ambulatory Visit (HOSPITAL_COMMUNITY): Payer: Self-pay

## 2022-05-01 ENCOUNTER — Other Ambulatory Visit: Payer: Self-pay

## 2022-05-01 ENCOUNTER — Inpatient Hospital Stay: Payer: Self-pay

## 2022-05-01 ENCOUNTER — Encounter: Payer: Self-pay | Admitting: Oncology

## 2022-05-01 VITALS — BP 154/88 | HR 81 | Temp 97.9°F | Resp 24

## 2022-05-01 VITALS — BP 148/82 | HR 84 | Temp 97.7°F | Resp 18 | Ht 60.0 in | Wt 224.6 lb

## 2022-05-01 DIAGNOSIS — C541 Malignant neoplasm of endometrium: Secondary | ICD-10-CM

## 2022-05-01 DIAGNOSIS — I82C11 Acute embolism and thrombosis of right internal jugular vein: Secondary | ICD-10-CM

## 2022-05-01 DIAGNOSIS — B372 Candidiasis of skin and nail: Secondary | ICD-10-CM

## 2022-05-01 LAB — CMP (CANCER CENTER ONLY)
ALT: 43 U/L (ref 0–44)
AST: 24 U/L (ref 15–41)
Albumin: 4.3 g/dL (ref 3.5–5.0)
Alkaline Phosphatase: 94 U/L (ref 38–126)
Anion gap: 8 (ref 5–15)
BUN: 9 mg/dL (ref 6–20)
CO2: 26 mmol/L (ref 22–32)
Calcium: 9.8 mg/dL (ref 8.9–10.3)
Chloride: 100 mmol/L (ref 98–111)
Creatinine: 0.51 mg/dL (ref 0.44–1.00)
GFR, Estimated: >60 mL/min (ref >60)
Glucose, Bld: 339 mg/dL — ABNORMAL HIGH (ref 70–99)
Potassium: 4.1 mmol/L (ref 3.5–5.1)
Sodium: 134 mmol/L — ABNORMAL LOW (ref 135–145)
Total Bilirubin: 0.3 mg/dL (ref 0.3–1.2)
Total Protein: 8.4 g/dL — ABNORMAL HIGH (ref 6.5–8.1)

## 2022-05-01 LAB — CBC WITH DIFFERENTIAL (CANCER CENTER ONLY)
Abs Immature Granulocytes: 0.01 10*3/uL (ref 0.00–0.07)
Basophils Absolute: 0 10*3/uL (ref 0.0–0.1)
Basophils Relative: 0 %
Eosinophils Absolute: 0 10*3/uL (ref 0.0–0.5)
Eosinophils Relative: 0 %
HCT: 39.5 % (ref 36.0–46.0)
Hemoglobin: 12.9 g/dL (ref 12.0–15.0)
Immature Granulocytes: 0 %
Lymphocytes Relative: 16 %
Lymphs Abs: 0.9 10*3/uL (ref 0.7–4.0)
MCH: 25.2 pg — ABNORMAL LOW (ref 26.0–34.0)
MCHC: 32.7 g/dL (ref 30.0–36.0)
MCV: 77.3 fL — ABNORMAL LOW (ref 80.0–100.0)
Monocytes Absolute: 0 10*3/uL — ABNORMAL LOW (ref 0.1–1.0)
Monocytes Relative: 1 %
Neutro Abs: 4.7 10*3/uL (ref 1.7–7.7)
Neutrophils Relative %: 83 %
Platelet Count: 220 10*3/uL (ref 150–400)
RBC: 5.11 MIL/uL (ref 3.87–5.11)
RDW: 21.5 % — ABNORMAL HIGH (ref 11.5–15.5)
WBC Count: 5.7 10*3/uL (ref 4.0–10.5)
nRBC: 0 % (ref 0.0–0.2)

## 2022-05-01 LAB — TSH: TSH: 0.484 u[IU]/mL (ref 0.350–4.500)

## 2022-05-01 MED ORDER — SODIUM CHLORIDE 0.9 % IV SOLN
500.0000 mg | Freq: Once | INTRAVENOUS | Status: AC
  Administered 2022-05-01: 500 mg via INTRAVENOUS
  Filled 2022-05-01: qty 10

## 2022-05-01 MED ORDER — FAMOTIDINE IN NACL 20-0.9 MG/50ML-% IV SOLN
20.0000 mg | Freq: Once | INTRAVENOUS | Status: AC
  Administered 2022-05-01: 20 mg via INTRAVENOUS
  Filled 2022-05-01: qty 50

## 2022-05-01 MED ORDER — SODIUM CHLORIDE 0.9 % IV SOLN
10.0000 mg | Freq: Once | INTRAVENOUS | Status: AC
  Administered 2022-05-01: 10 mg via INTRAVENOUS
  Filled 2022-05-01: qty 10

## 2022-05-01 MED ORDER — PALONOSETRON HCL INJECTION 0.25 MG/5ML
0.2500 mg | Freq: Once | INTRAVENOUS | Status: AC
  Administered 2022-05-01: 0.25 mg via INTRAVENOUS
  Filled 2022-05-01: qty 5

## 2022-05-01 MED ORDER — INSULIN ASPART 100 UNIT/ML IJ SOLN
10.0000 [IU] | Freq: Once | INTRAMUSCULAR | Status: AC
  Administered 2022-05-01: 10 [IU] via SUBCUTANEOUS
  Filled 2022-05-01: qty 1

## 2022-05-01 MED ORDER — HEPARIN SOD (PORK) LOCK FLUSH 100 UNIT/ML IV SOLN
500.0000 [IU] | Freq: Once | INTRAVENOUS | Status: AC | PRN
  Administered 2022-05-01: 500 [IU]

## 2022-05-01 MED ORDER — SODIUM CHLORIDE 0.9% FLUSH
10.0000 mL | INTRAVENOUS | Status: DC | PRN
  Administered 2022-05-01: 10 mL

## 2022-05-01 MED ORDER — CETIRIZINE HCL 10 MG/ML IV SOLN
10.0000 mg | Freq: Once | INTRAVENOUS | Status: AC
  Administered 2022-05-01: 10 mg via INTRAVENOUS
  Filled 2022-05-01: qty 1

## 2022-05-01 MED ORDER — SODIUM CHLORIDE 0.9 % IV SOLN
150.0000 mg | Freq: Once | INTRAVENOUS | Status: AC
  Administered 2022-05-01: 150 mg via INTRAVENOUS
  Filled 2022-05-01: qty 150

## 2022-05-01 MED ORDER — SODIUM CHLORIDE 0.9 % IV SOLN
750.0000 mg | Freq: Once | INTRAVENOUS | Status: AC
  Administered 2022-05-01: 750 mg via INTRAVENOUS
  Filled 2022-05-01: qty 75

## 2022-05-01 MED ORDER — SODIUM CHLORIDE 0.9 % IV SOLN: Freq: Once | INTRAVENOUS | Status: AC

## 2022-05-01 MED ORDER — DEXAMETHASONE 4 MG PO TABS: ORAL_TABLET | ORAL | 6 refills | Status: DC

## 2022-05-01 MED ORDER — SODIUM CHLORIDE 0.9 % IV SOLN
60.0000 mg/m2 | Freq: Once | INTRAVENOUS | Status: AC
  Administered 2022-05-01: 125 mg via INTRAVENOUS
  Filled 2022-05-01: qty 12.5

## 2022-05-01 MED ORDER — APIXABAN 5 MG PO TABS
5.0000 mg | ORAL_TABLET | Freq: Two times a day (BID) | ORAL | 3 refills | Status: DC
  Filled 2022-05-01 (×2): qty 60, 30d supply, fill #0

## 2022-05-01 MED ORDER — SODIUM CHLORIDE 0.9% FLUSH
10.0000 mL | Freq: Once | INTRAVENOUS | Status: AC
  Administered 2022-05-01: 10 mL

## 2022-05-01 NOTE — Progress Notes (Signed)
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   All questions were answered. The patient knows to call the clinic with any problems, questions or concerns. The total time spent in the appointment was 30 minutes encounter with patients including review of chart and various tests results, discussions about plan of care and coordination of care plan   Artis Delay, MD 05/01/2022 12:05 PM  INTERVAL HISTORY: Please see below for problem oriented charting. she returns for treatment follow-up with treatment for adjuvant therapy She was recently diagnosed with DVT and is placed on anticoagulation therapy She is doing well with no bleeding Her skin rash is clearing up  REVIEW OF SYSTEMS:   Constitutional: Denies fevers, chills or abnormal weight loss Eyes: Denies blurriness of vision Ears, nose, mouth, throat, and face: Denies mucositis or sore throat Respiratory: Denies cough, dyspnea or wheezes Cardiovascular: Denies palpitation, chest discomfort or lower extremity swelling Gastrointestinal:  Denies nausea, heartburn or change in bowel habits Skin: Denies abnormal skin rashes Lymphatics: Denies  new lymphadenopathy or easy bruising Neurological:Denies numbness, tingling or new weaknesses Behavioral/Psych: Mood is stable, no new changes  All other systems were reviewed with the patient and are negative.  I have reviewed the past medical history, past surgical history, social history and family history with the patient and they are unchanged from previous note.  ALLERGIES:  is allergic to paclitaxel and advil [ibuprofen].  MEDICATIONS:  Current Outpatient Medications  Medication Sig Dispense Refill   apixaban (ELIQUIS) 5 MG TABS tablet Take 1 tablet (5 mg total) by mouth 2 (two) times daily. 60 tablet 3   dexamethasone (DECADRON) 4 MG tablet Take 2 tabs the day before chemo and 1 tab daily for 2 days after chemotherapy, every 3 weeks, by mouth x 6 cycles 24 tablet 6   lidocaine-prilocaine (EMLA) cream Apply to affected area once 30 g 3   ondansetron (ZOFRAN) 8 MG tablet Take 1 tablet (8 mg total) by mouth every 8 (eight) hours as needed for nausea or vomiting. Start on the third day after chemotherapy. 30 tablet 1   prochlorperazine (COMPAZINE) 10 MG tablet Take 1 tablet (10 mg total) by mouth every 6 (six) hours as needed for nausea or vomiting. 30 tablet 1   No current facility-administered medications for this visit.   Facility-Administered Medications Ordered in Other Visits  Medication Dose Route Frequency Provider Last Rate Last Admin   CARBOplatin (PARAPLATIN) 750 mg in sodium chloride 0.9 % 250 mL chemo infusion  750 mg Intravenous Once Bertis Ruddy, Montzerrat Brunell, MD       DOCEtaxel (TAXOTERE) 125 mg in sodium chloride 0.9 % 250 mL chemo infusion  60 mg/m2 (Treatment Plan Recorded) Intravenous Once Artis Delay, MD 263 mL/hr at  05/01/22 1138 125 mg at 05/01/22 1138   heparin lock flush 100 unit/mL  500 Units Intracatheter Once PRN Bertis Ruddy, Tenika Keeran, MD       sodium chloride flush (NS) 0.9 % injection 10 mL  10 mL Intracatheter PRN Artis Delay, MD        SUMMARY OF ONCOLOGIC HISTORY: Oncology  History Overview Note  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 Endometrial thickening possibly due to obstruction from the cystic mass. Direct visualization is recommended.   Enlarged right para-aortic and right external iliac nodes are indeterminate but concerning for metastasis given cervical lesion.   Borderline enlargement and fecalization of the distal ileum. No definite transition point. Findings suggestive of slow transit/constipation.   01/14/2022 Initial Biopsy   Endometrial biopsy and ECC: A. ENDOCERVIX, CURETTAGE: - BENIGN ENDOCERVICAL MUCOSA WITH ACUTE AND CHRONIC INFLAMMATION - NEGATIVE FOR MALIGNANCY  B. 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  The tumor cells are partially positive for p16. The p53 stain shows foci with wild-type staining and foci with overexpression.  The tumor cells show patchy positivity for ER and PR and are negative for Napsin A.  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  Drs. Arthur Holms reviewed the case and agree with the above diagnosis.  Clinical correlation recommended.    01/14/2022 Initial Diagnosis   Endometrial cancer (HCC)   01/24/2022 Imaging   MR pelvis 1. There is an enhancing mass identified within the lower uterine segment which measures 4.3 x 3.9 by 4.7 cm. This invades the anterior myometrium within the lower uterine segment. This appears to terminate at the level of the internal os of the cervix. Findings are compatible with endometrial carcinoma. 2. Aortocaval, right common  iliac, and bilateral pelvic sidewall lymph nodes are enlarged compatible with metastatic adenopathy. Given the presence of periaortic and pelvic node involvement imaging findings are compatible with stage IIIC2 disease.   02/06/2022 PET scan   1. Diffusely increased uptake within the endometrium and cervix compatible with known endometrial carcinoma. 2. Tracer avid retroperitoneal and pelvic lymph nodes compatible with nodal metastasis. 3. No signs of solid organ metastasis or distant metastatic disease. 4. Nonspecific, diffuse increased bone marrow uptake is noted within the axial and appendicular skeleton. No focal areas of increased uptake to suggest osseous metastasis. 5. Multifocal bilateral areas of fibrosis, architectural distortion and scarring within both lungs which is favored to represent a inflammatory or infectious process. Findings may be the sequelae of prior atypical viral infection.   02/10/2022 Pathology Results   A. RIGHT AORTOCAVAL LYMPH NODE, EXCISION: Two benign lymph nodes, negative for carcinoma (0/2)  B. RIGHT PELVIC LYMPH NODE, EXCISION: Two of two lymph nodes with metastatic adenocarcinoma (2/2)  C. LEFT OBTURATOR PELVIC LYMPH NODE, EXCISION: Three lymph nodes, negative for carcinoma (0/3)  D. UTERUS WITH RIGHT AND LEFT FALLOPIAN TUBE AND OVARY, HYSTERECTOMY AND BILATERAL SALPINGO-OOPHORECTOMY: Invasive moderate to poorly differentiated endometrioid adenocarcinoma, FIGO 3 Tumor measures 5.8 x 3.0 and invades 1.0 cm and 30% of the myometrium (10 of 33 mm) (pT1a) Tumor extends into the anterior lower uterine segment Background complex atypical hyperplasia (EIN) Angiolymphatic invasion  present Focal adenomyosis Benign leiomyoma, intramural, measuring 0.6 cm in greatest dimension Chronic cervicitis with squamous metaplasia Benign cystic follicles and luteal cyst of left ovary Bilateral hydrosalpinx Benign right ovary  E. RIGHT POSTERIOR CERVICAL MARGIN,  EXCISION: Benign cervical stroma Negative for carcinoma  ONCOLOGY TABLE:  UTERUS, CARCINOMA OR CARCINOSARCOMA: Resection  Procedure: Total hysterectomy and bilateral salpingo-oophorectomy with node sampling Histologic Type: Endometrioid adenocarcinoma Histologic Grade: High-grade, FIGO 3 Myometrial Invasion:      Depth of Myometrial Invasion (mm): 10 mm      Myometrial Thickness (mm): 33 mm      Percentage of Myometrial Invasion: 30% Uterine Serosa Involvement: Not identified Cervical stromal Involvement: Not identified Extent of involvement of other tissue/organs: Not identified Peritoneal/Ascitic Fluid: []  Lymphovascular Invasion: Present Regional Lymph Nodes:      Pelvic Lymph Nodes Examined: 5 nonsentinel lymph nodes      Pelvic Lymph Nodes with Metastasis: 2          Macrometastasis: (>2.0 mm): 2          Micrometastasis: (>0.2 mm and < 2.0 mm): 0          Isolated Tumor Cells (<0.2 mm): 0          Laterality of Lymph Node with Tumor: Right          Extracapsular Extension: Not identified      Para-aortic Lymph Nodes Examined: 2 nonsentinel lymph nodes       Para-aortic Lymph Nodes with Metastasis: 0          Macrometastasis: (>2.0 mm): 0          Micrometastasis: (>0.2 mm and < 2.0 mm): 0          Isolated Tumor Cells (<0.2 mm): 0 Distant Metastasis: Not applicable Pathologic Stage Classification (pTNM, AJCC 8th Edition): pT1a, pN1a  Ancillary Studies: MMR testing has been ordered and the results will be issued within an addendum to this report.    02/10/2022 Surgery   Preoperative Diagnosis: Endometrioid endometrial cancer FIGO grade 2, Morbid obesity (BMI 44)    Procedures: Robotic-assisted total laparoscopic hysterectomy, bilateral salpingo-oophorectomy, bilateral sentinel lymph node injection and debulking of enlarge, PET avid pelvic and para-aortic lymph nodes (Modifer 22: extreme morbid obesity, BMI 44, with significant retroperitoneal and intraperitoneal adiposity  requiring additional OR personnel for positioning and retraction. Obesity made retroperitoneal visualization limited, increasing the complexity of the case and necessitating additional instrumentation for retraction and to create safe exposure. Obesity related complexity increased the duration of the procedure by 60 minutes.)   Surgeon: Clide Cliff, MD    Findings: Normal upper abdominal survey with normal liver surface and diaphragm. Normal appearing small and large bowel. Globally enlarged uterus. Overall normal tubes, and ovaries, small paratubal cyst. No evidence of peritoneal disease, ascites, or carcinomatosis. Enlarged bilateral pelvic and right para-aortic lymph nodes removed. Tumor spill into the vagina and pelvis noted with colpotomy. Pelvis and vagina copiously irrigated. Significant intra-abdominal adiposity.   02/23/2022 Cancer Staging   Staging form: Corpus Uteri - Carcinoma and Carcinosarcoma, AJCC 8th Edition - Pathologic stage from 02/23/2022: Stage IIIC1 (pT1a, pN1, cM0) - Signed by Artis Delay, MD on 02/23/2022 Stage prefix: Initial diagnosis   03/05/2022 Procedure   Status post right IJ port catheter placement.    03/20/2022 -  Chemotherapy   Patient is on Treatment Plan : UTERINE ENDOMETRIAL Dostarlimab-gxly (500 mg) + carboplatin + Taxotere q21d x 6 cycles / Dostarlimab-gxly (1000 mg) q42d x 6 cycles  04/01/2022 Genetic Testing   Negative genetic testing on the CancerNext-Expanded+RNAinsight panel.  MSH3 VUS identified.  The report date is April 01, 2022.  The CancerNext-Expanded gene panel offered by [facility] and includes sequencing and rearrangement analysis for the following 77 genes: AIP, ALK, APC*, ATM*, AXIN2, BAP1, BARD1, BMPR1A, BRCA1*, BRCA2*, BRIP1*, CDC73, CDH1*, CDK4, CDKN1B, CDKN2A, CHEK2*, CTNNA1, DICER1, FH, FLCN, KIF1B, LZTR1, MAX, MEN1, MET, MLH1*, MSH2*, MSH3, MSH6*, MUTYH*, NF1*, NF2, NTHL1, PALB2*, PHOX2B, PMS2*, POT1, PRKAR1A, PTCH1, PTEN*,  RAD51C*, RAD51D*, RB1, RET, SDHA, SDHAF2, SDHB, SDHC, SDHD, SMAD4, SMARCA4, SMARCB1, SMARCE1, STK11, SUFU, TMEM127, TP53*, TSC1, TSC2, and VHL (sequencing and deletion/duplication); EGFR, EGLN1, HOXB13, KIT, MITF, PDGFRA, POLD1, and POLE (sequencing only); EPCAM and GREM1 (deletion/duplication only). DNA and RNA analyses performed for * genes.    04/01/2022 Procedure   Right:  No evidence of superficial vein thrombosis in the upper extremity.  Findings  consistent with acute deep vein thrombosis involving the right internal jugular vein, right subclavian vein and right brachial veins.    Left:  No evidence of thrombosis in the subclavian.      PHYSICAL EXAMINATION: ECOG PERFORMANCE STATUS: 1 - Symptomatic but completely ambulatory  Vitals:   05/01/22 0918  BP: (!) 148/82  Pulse: 84  Resp: 18  Temp: 97.7 F (36.5 C)  SpO2: 97%   Filed Weights   05/01/22 0918  Weight: 224 lb 9.6 oz (101.9 kg)    GENERAL:alert, no distress and comfortable SKIN: Yeast infection is clearing up.  The acne on her scalp is resolved  NEURO: alert & oriented x 3 with fluent speech, no focal motor/sensory deficits  LABORATORY DATA:  I have reviewed the data as listed    Component Value Date/Time   NA 134 (L) 05/01/2022 0852   K 4.1 05/01/2022 0852   CL 100 05/01/2022 0852   CO2 26 05/01/2022 0852   GLUCOSE 339 (H) 05/01/2022 0852   BUN 9 05/01/2022 0852   CREATININE 0.51 05/01/2022 0852   CALCIUM 9.8 05/01/2022 0852   PROT 8.4 (H) 05/01/2022 0852   ALBUMIN 4.3 05/01/2022 0852   AST 24 05/01/2022 0852   ALT 43 05/01/2022 0852   ALKPHOS 94 05/01/2022 0852   BILITOT 0.3 05/01/2022 0852   GFRNONAA >60 05/01/2022 0852   GFRAA >90 12/13/2011 2039    No results found for: "SPEP", "UPEP"  Lab Results  Component Value Date   WBC 5.7 05/01/2022   NEUTROABS 4.7 05/01/2022   HGB 12.9 05/01/2022   HCT 39.5 05/01/2022   MCV 77.3 (L) 05/01/2022   PLT 220 05/01/2022      Chemistry       Component Value Date/Time   NA 134 (L) 05/01/2022 0852   K 4.1 05/01/2022 0852   CL 100 05/01/2022 0852   CO2 26 05/01/2022 0852   BUN 9 05/01/2022 0852   CREATININE 0.51 05/01/2022 0852      Component Value Date/Time   CALCIUM 9.8 05/01/2022 0852   ALKPHOS 94 05/01/2022 0852   AST 24 05/01/2022 0852   ALT 43 05/01/2022 0852   BILITOT 0.3 05/01/2022 0852       RADIOGRAPHIC STUDIES: I have personally reviewed the radiological images as listed and agreed with the findings in the report. IR Port Repair Central Venous Access Device  Result Date: 04/09/2022 INDICATION: Malfunctioning port catheter EXAM: Title procedure: PORT REVISION Procedures: 1. ULTRASOUND-GUIDED RIGHT FEMORAL VENOUS ACCESS 2. PORT CATHETER REPOSITIONED VIA SNARE TECHNIQUE 3. PORT CATHETER INJECTION COMPARISON:  Chest XR,  04/07/2022. IR fluoroscopy, 03/05/2022 MEDICATIONS: None. SEDATION: Moderate (conscious) sedation was employed during this procedure. A total of Versed 2 mg and Fentanyl 100 mcg was administered intravenously. Moderate Sedation Time: 17 minutes. The patient's level of consciousness and vital signs were monitored continuously by radiology nursing throughout the procedure under my direct supervision. CONTRAST:  20mL Omnipaque 300 FLUOROSCOPY TIME:  Fluoroscopic dose; 18 mGy COMPLICATIONS: None immediate. TECHNIQUE: The procedure, risks, benefits, and alternatives were explained to the patient and/or patient's representative and informed written consent was obtained. A timeout was performed prior to the initiation of the procedure. Maximal barrier sterile technique utilized including caps, mask, sterile gowns, sterile gloves, large sterile drape, hand hygiene, and chlorhexidine prep. The patient was placed supine on the fluoroscopy table. The patient's chest port a catheter was accessed under sterile technique. A preprocedural spot fluoroscopic image was obtained of the chest in existing port a catheter. Note was  made of difficulty aspirating blood from the port a catheter. Under sterile condition and local anesthesia, the RIGHT common femoral venous access was performed with ultrasound. An ultrasound image was saved and sent to PACS. Over a guidewire, the snare catheter and loop were advanced into the SVC just below the catheter tip. The port catheter tip was successfully snared and retracted into the distal SVC. Contrast was injected via the Port a catheter and images were reviewed. The Port a catheter was flushed with a heparin dwell and de accessed. The groin sheath was removed and hemostasis was obtained with manual compression. A dressing was placed. The patient tolerated the procedure well without immediate post procedural complication. FINDINGS: *Retracted port catheter with tip within the superior vena cava. *Port repositioning into the proximal RIGHT atrium by loop snare technique. *Port catheter injection without fluoroscopic evidence of fibrin sheath. IMPRESSION: Successful fluoroscopic-guided port revision by snare technique, via a trans femoral venous approach. The tip of the catheter is positioned within the proximal RIGHT atrium. The port is ready for use. Roanna Banning, MD [facility] Radiology Electronically Signed   By: Roanna Banning M.D.   On: 04/09/2022 16:53   IR US Guide Vasc Access Right  Result Date: 04/09/2022 INDICATION: Malfunctioning port catheter EXAM: Title procedure: PORT REVISION Procedures: 1. ULTRASOUND-GUIDED RIGHT FEMORAL VENOUS ACCESS 2. PORT CATHETER REPOSITIONED VIA SNARE TECHNIQUE 3. PORT CATHETER INJECTION COMPARISON:  Chest XR, 04/07/2022. IR fluoroscopy, 03/05/2022 MEDICATIONS: None. SEDATION: Moderate (conscious) sedation was employed during this procedure. A total of Versed 2 mg and Fentanyl 100 mcg was administered intravenously. Moderate Sedation Time: 17 minutes. The patient's level of consciousness and vital signs were  monitored continuously by radiology nursing throughout the procedure under my direct supervision. CONTRAST:  20mL Omnipaque 300 FLUOROSCOPY TIME:  Fluoroscopic dose; 18 mGy COMPLICATIONS: None immediate. TECHNIQUE: The procedure, risks, benefits, and alternatives were explained to the patient and/or patient's representative and informed written consent was obtained. A timeout was performed prior to the initiation of the procedure. Maximal barrier sterile technique utilized including caps, mask, sterile gowns, sterile gloves, large sterile drape, hand hygiene, and chlorhexidine prep. The patient was placed supine on the fluoroscopy table. The patient's chest port a catheter was accessed under sterile technique. A preprocedural spot fluoroscopic image was obtained of the chest in existing port a catheter. Note was made of difficulty aspirating blood from the port a catheter. Under sterile condition and local anesthesia, the RIGHT common femoral venous access was performed with ultrasound. An ultrasound image was saved and sent to  PACS. Over a guidewire, the snare catheter and loop were advanced into the SVC just below the catheter tip. The port catheter tip was successfully snared and retracted into the distal SVC. Contrast was injected via the Port a catheter and images were reviewed. The Port a catheter was flushed with a heparin dwell and de accessed. The groin sheath was removed and hemostasis was obtained with manual compression. A dressing was placed. The patient tolerated the procedure well without immediate post procedural complication. FINDINGS: *Retracted port catheter with tip within the superior vena cava. *Port repositioning into the proximal RIGHT atrium by loop snare technique. *Port catheter injection without fluoroscopic evidence of fibrin sheath. IMPRESSION: Successful fluoroscopic-guided port revision by snare technique, via a trans femoral venous approach. The tip of the catheter is positioned  within the proximal RIGHT atrium. The port is ready for use. Roanna Banning, MD [facility] Radiology Electronically Signed   By: Roanna Banning M.D.   On: 04/09/2022 16:53   IR CV Line Injection  Result Date: 04/09/2022 INDICATION: Malfunctioning port catheter EXAM: Title procedure: PORT REVISION Procedures: 1. ULTRASOUND-GUIDED RIGHT FEMORAL VENOUS ACCESS 2. PORT CATHETER REPOSITIONED VIA SNARE TECHNIQUE 3. PORT CATHETER INJECTION COMPARISON:  Chest XR, 04/07/2022. IR fluoroscopy, 03/05/2022 MEDICATIONS: None. SEDATION: Moderate (conscious) sedation was employed during this procedure. A total of Versed 2 mg and Fentanyl 100 mcg was administered intravenously. Moderate Sedation Time: 17 minutes. The patient's level of consciousness and vital signs were monitored continuously by radiology nursing throughout the procedure under my direct supervision. CONTRAST:  20mL Omnipaque 300 FLUOROSCOPY TIME:  Fluoroscopic dose; 18 mGy COMPLICATIONS: None immediate. TECHNIQUE: The procedure, risks, benefits, and alternatives were explained to the patient and/or patient's representative and informed written consent was obtained. A timeout was performed prior to the initiation of the procedure. Maximal barrier sterile technique utilized including caps, mask, sterile gowns, sterile gloves, large sterile drape, hand hygiene, and chlorhexidine prep. The patient was placed supine on the fluoroscopy table. The patient's chest port a catheter was accessed under sterile technique. A preprocedural spot fluoroscopic image was obtained of the chest in existing port a catheter. Note was made of difficulty aspirating blood from the port a catheter. Under sterile condition and local anesthesia, the RIGHT common femoral venous access was performed with ultrasound. An ultrasound image was saved and sent to PACS. Over a guidewire, the snare catheter and loop were advanced into the SVC just below the  catheter tip. The port catheter tip was successfully snared and retracted into the distal SVC. Contrast was injected via the Port a catheter and images were reviewed. The Port a catheter was flushed with a heparin dwell and de accessed. The groin sheath was removed and hemostasis was obtained with manual compression. A dressing was placed. The patient tolerated the procedure well without immediate post procedural complication. FINDINGS: *Retracted port catheter with tip within the superior vena cava. *Port repositioning into the proximal RIGHT atrium by loop snare technique. *Port catheter injection without fluoroscopic evidence of fibrin sheath. IMPRESSION: Successful fluoroscopic-guided port revision by snare technique, via a trans femoral venous approach. The tip of the catheter is positioned within the proximal RIGHT atrium. The port is ready for use. Roanna Banning, MD [facility] Radiology Electronically Signed   By: Roanna Banning M.D.   On: 04/09/2022 16:53   IR Chest Fluoro  Result Date: 04/07/2022 INDICATION: Unableto flush port EXAM: EVALUATION OF PORT A CATH UNDER FLUOROSCOPY COMPARISON:  IR fluoroscopy 03/05/2022. MEDICATIONS: None ANESTHESIA/SEDATION: None CONTRAST:  None FLUOROSCOPY TIME:  Fluoroscopic dose; 2 mGy COMPLICATIONS: None immediate. PROCEDURE: The patient's right anterior chest wall subclavian vein approach port a catheter had been previously accessed. The patient was placed supine on the fluoroscopy table. A preprocedural spot radiographic image was obtained of the existing right subclavian vein approach port a catheter with tip overlying the distal aspect of the SVC. Port a Catheter would NOT easily aspirate. Port catheter injection was aborted. The Port a catheter was flushed with a heparin dwell and de-accessed. A dressing was placed. The patient tolerated the procedure well without immediate postprocedural complication. FINDINGS:  Retracted and kinked RIGHT port catheter. The catheter would NOT aspirate, port injection was aborted. IMPRESSION: Retracted and kinked RIGHT port catheter. *DO NOT USE RIGHT chest port. PLAN: *Discussed revision versus replacement of port catheter. *Patient is amenable for revision as a first step. She will be scheduled to return this week for procedure. Roanna Banning, MD [facility] Radiology Electronically Signed   By: Roanna Banning M.D.   On: 04/07/2022 09:50   VAS [place] UPPER EXTREMITY VENOUS DUPLEX  Result Date: 04/01/2022 UPPER VENOUS STUDY  Patient Name:  KLOIE WHITING  Date of Exam:   04/01/2022 Medical Rec #: [id number]         Accession #:    [id number] Date of Birth: Sep 11, 1970        Patient Gender: F Patient Age:   52 years Exam Location:  [facility] Procedure:      VAS [place] UPPER EXTREMITY VENOUS DUPLEX Referring Phys: Namon Cirri --------------------------------------------------------------------------------  Indications: Right arm pain and swelling w/ indwelling port Risk Factors: Cancer - endometrial. Comparison Study: No prior studies. Performing Technologist: Jean Rosenthal RDMS, RVT  Examination Guidelines: A complete evaluation includes B-mode imaging, spectral Doppler, color Doppler, and power Doppler as needed of all accessible portions of each vessel. Bilateral testing is considered an integral part of a complete examination. Limited examinations for reoccurring indications may be performed as noted.  Right Findings: +----------+------------+---------+-----------+---------------+-------+ RIGHT     CompressiblePhasicitySpontaneous  Properties   Summary +----------+------------+---------+-----------+---------------+-------+ IJV           None       No        No                     Acute  +----------+------------+---------+-----------+---------------+-------+ Subclavian    None       No        No                      Acute  +----------+------------+---------+-----------+---------------+-------+ Axillary      Full       No        No     Continuous flow        +----------+------------+---------+-----------+---------------+-------+ Brachial    Partial      Yes       Yes                    Acute  +----------+------------+---------+-----------+---------------+-------+ Radial        Full                                               +----------+------------+---------+-----------+---------------+-------+ Ulnar  Full                                               +----------+------------+---------+-----------+---------------+-------+ Cephalic      Full                                               +----------+------------+---------+-----------+---------------+-------+ Basilic       Full                                               +----------+------------+---------+-----------+---------------+-------+  Left Findings: +----------+------------+---------+-----------+----------+-------+ LEFT      CompressiblePhasicitySpontaneousPropertiesSummary +----------+------------+---------+-----------+----------+-------+ Subclavian               Yes       Yes                      +----------+------------+---------+-----------+----------+-------+  Summary:  Right: No evidence of superficial vein thrombosis in the upper extremity. Findings consistent with acute deep vein thrombosis involving the right internal jugular vein, right subclavian vein and right brachial veins.  Left: No evidence of thrombosis in the subclavian.  *See table(s) above for measurements and observations.  Diagnosing physician: Waverly Ferrari MD Electronically signed by Waverly Ferrari MD on 04/01/2022 at 3:11:13 PM.    Final

## 2022-05-01 NOTE — Progress Notes (Signed)
Met with [place] in the infusion room and advised her that the Eliquis prescription is going to be sent to the [facility] at the [facility].  Advised her to meet with the Patient Assistance Liaison to discuss the patient assistance form for the manufacturer.  [place] verbalized understanding and agreement.

## 2022-05-01 NOTE — Assessment & Plan Note (Signed)
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

## 2022-05-01 NOTE — Progress Notes (Signed)
Spoke to pt regarding the [employer].  She would like to apply and since she's not working right now she will provide a letter of support.  Once received I will approve her for the $1000 [employer].

## 2022-05-01 NOTE — Assessment & Plan Note (Signed)
She is doing well on anticoagulation therapy She will continue the same Due to financial constraint, we will try to help her get her medication with financial assistance

## 2022-05-01 NOTE — Assessment & Plan Note (Signed)
We discussed substituting paclitaxel with Taxotere I have modified the way she takes dexamethasone We discussed importance of blood sugar control Due to recent DVT, she will be on Eliquis for some time and I refilled her prescription today

## 2022-05-01 NOTE — Patient Instructions (Signed)
[place] [facility]  Discharge Instructions: Thank you for choosing [facility] to provide your oncology and hematology care.   If you have a lab appointment with the Cancer Center, please go directly to the Cancer Center and check in at the registration area.   Wear comfortable clothing and clothing appropriate for easy access to any Portacath or PICC line.   We strive to give you quality time with your provider. You may need to reschedule your appointment if you arrive late (15 or more minutes).  Arriving late affects you and other patients whose appointments are after yours.  Also, if you miss three or more appointments without notifying the office, you may be dismissed from the clinic at the provider's discretion.      For prescription refill requests, have your pharmacy contact our office and allow 72 hours for refills to be completed.    Today you received the following chemotherapy and/or immunotherapy agents: Dostarlimab, Docetaxel, Carboplatin      To help prevent nausea and vomiting after your treatment, we encourage you to take your nausea medication as directed.  BELOW ARE SYMPTOMS THAT SHOULD BE REPORTED IMMEDIATELY: *FEVER GREATER THAN 100.4 F (38 C) OR HIGHER *CHILLS OR SWEATING *NAUSEA AND VOMITING THAT IS NOT CONTROLLED WITH YOUR NAUSEA MEDICATION *UNUSUAL SHORTNESS OF BREATH *UNUSUAL BRUISING OR BLEEDING *URINARY PROBLEMS (pain or burning when urinating, or frequent urination) *BOWEL PROBLEMS (unusual diarrhea, constipation, pain near the anus) TENDERNESS IN MOUTH AND THROAT WITH OR WITHOUT PRESENCE OF ULCERS (sore throat, sores in mouth, or a toothache) UNUSUAL RASH, SWELLING OR PAIN  UNUSUAL VAGINAL DISCHARGE OR ITCHING   Items with * indicate a potential emergency and should be followed up as soon as possible or go to the Emergency Department if any problems should occur.  Please show the CHEMOTHERAPY ALERT CARD or  IMMUNOTHERAPY ALERT CARD at check-in to the Emergency Department and triage nurse.  Should you have questions after your visit or need to cancel or reschedule your appointment, please contact [place] [facility]  Dept: [phone]  and follow the prompts.  Office hours are 8:00 a.m. to 4:30 p.m. Monday - Friday. Please note that voicemails left after 4:00 p.m. may not be returned until the following business day.  We are closed weekends and major holidays. You have access to a nurse at all times for urgent questions. Please call the main number to the clinic Dept: [phone] and follow the prompts.   For any non-urgent questions, you may also contact your provider using MyChart. We now offer e-Visits for anyone 15 and older to request care online for non-urgent symptoms. For details visit [email or web address].   Also download the MyChart app! Go to the app store, search "MyChart", open the app, select [place], and log in with your MyChart username and password.  Docetaxel Injection What is this medication? DOCETAXEL (doe se TAX el) treats some types of cancer. It works by slowing down the growth of cancer cells. This medicine may be used for other purposes; ask your health care provider or pharmacist if you have questions. COMMON BRAND NAME(S): Docefrez, Taxotere What should I tell my care team before I take this medication? They need to know if you have any of these conditions: Kidney disease Liver disease Low white blood cell levels Tingling of the fingers or toes or other nerve disorder An unusual or allergic reaction to docetaxel, polysorbate 80, other medications, foods,  dyes, or preservatives Pregnant or trying to get pregnant Breast-feeding How should I use this medication? This medication is injected into a vein. It is given by your care team in a hospital or clinic setting. Talk to your care team about the use of this medication in children.  Special care may be needed. Overdosage: If you think you have taken too much of this medicine contact a poison control center or emergency room at once. NOTE: This medicine is only for you. Do not share this medicine with others. What if I miss a dose? Keep appointments for follow-up doses. It is important not to miss your dose. Call your care team if you are unable to keep an appointment. What may interact with this medication? Do not take this medication with any of the following: Live virus vaccines This medication may also interact with the following: Certain antibiotics, such as clarithromycin, telithromycin Certain antivirals for HIV or hepatitis Certain medications for fungal infections, such as itraconazole, ketoconazole, voriconazole Grapefruit juice Nefazodone Supplements, such as St. John's wort This list may not describe all possible interactions. Give your health care provider a list of all the medicines, herbs, non-prescription drugs, or dietary supplements you use. Also tell them if you smoke, drink alcohol, or use illegal drugs. Some items may interact with your medicine. What should I watch for while using this medication? This medication may make you feel generally unwell. This is not uncommon as chemotherapy can affect healthy cells as well as cancer cells. Report any side effects. Continue your course of treatment even though you feel ill unless your care team tells you to stop. You may need blood work done while you are taking this medication. This medication can cause serious side effects and infusion reactions. To reduce the risk, your care team may give you other medications to take before receiving this one. Be sure to follow the directions from your care team. This medication may increase your risk of getting an infection. Call your care team for advice if you get a fever, chills, sore throat, or other symptoms of a cold or flu. Do not treat yourself. Try to avoid being  around people who are sick. Avoid taking medications that contain aspirin, acetaminophen, ibuprofen, naproxen, or ketoprofen unless instructed by your care team. These medications may hide a fever. Be careful brushing or flossing your teeth or using a toothpick because you may get an infection or bleed more easily. If you have any dental work done, tell your dentist you are receiving this medication. Some products may contain alcohol. Ask your care team if this medication contains alcohol. Be sure to tell all care teams you are taking this medicine. Certain medications, like metronidazole and disulfiram, can cause an unpleasant reaction when taken with alcohol. The reaction includes flushing, headache, nausea, vomiting, sweating, and increased thirst. The reaction can last from 30 minutes to several hours. This medication may affect your coordination, reaction time, or judgement. Do not drive or operate machinery until you know how this medication affects you. Sit up or stand slowly to reduce the risk of dizzy or fainting spells. Drinking alcohol with this medication can increase the risk of these side effects. Talk to your care team about your risk of cancer. You may be more at risk for certain types of cancer if you take this medication. Talk to your care team if you wish to become pregnant or think you might be pregnant. This medication can cause serious birth defects if taken  during pregnancy or if you get pregnant within 2 months after stopping therapy. A negative pregnancy test is required before starting this medication. A reliable form of contraception is recommended while taking this medication and for 2 months after stopping it. Talk to your care team about reliable forms of contraception. Do not breast-feed while taking this medication and for 1 week after stopping therapy. Use a condom during sex and for 4 months after stopping therapy. Tell your care team right away if you think your partner  might be pregnant. This medication can cause serious birth defects. This medication may cause infertility. Talk to your care team if you are concerned about your fertility. What side effects may I notice from receiving this medication? Side effects that you should report to your care team as soon as possible: Allergic reactions--skin rash, itching, hives, swelling of the face, lips, tongue, or throat Change in vision such as blurry vision, seeing halos around lights, vision loss Infection--fever, chills, cough, or sore throat Infusion reactions--chest pain, shortness of breath or trouble breathing, feeling faint or lightheaded Low red blood cell level--unusual weakness or fatigue, dizziness, headache, trouble breathing Pain, tingling, or numbness in the hands or feet Painful swelling, warmth, or redness of the skin, blisters or sores at the infusion site Redness, blistering, peeling, or loosening of the skin, including inside the mouth Sudden or severe stomach pain, bloody diarrhea, fever, nausea, vomiting Swelling of the ankles, hands, or feet Tumor lysis syndrome (TLS)--nausea, vomiting, diarrhea, decrease in the amount of urine, dark urine, unusual weakness or fatigue, confusion, muscle pain or cramps, fast or irregular heartbeat, joint pain Unusual bruising or bleeding Side effects that usually do not require medical attention (report to your care team if they continue or are bothersome): Change in nail shape, thickness, or color Change in taste Hair loss Increased tears This list may not describe all possible side effects. Call your doctor for medical advice about side effects. You may report side effects to FDA at 1-800-FDA-1088. Where should I keep my medication? This medication is given in a hospital or clinic. It will not be stored at home. NOTE: This sheet is a summary. It may not cover all possible information. If you have questions about this medicine, talk to your doctor,  pharmacist, or health care provider.  2023 Elsevier/Gold Standard (2007-02-12 00:00:00)

## 2022-05-03 LAB — T4: T4, Total: 10.3 ug/dL (ref 4.5–12.0)

## 2022-05-04 ENCOUNTER — Encounter: Payer: Self-pay | Admitting: Hematology and Oncology

## 2022-05-04 ENCOUNTER — Telehealth: Payer: Self-pay | Admitting: *Deleted

## 2022-05-04 NOTE — Telephone Encounter (Signed)
Called pt to see how she did with her recent treatment.  She reports feeling dizzy since Saturday. She states she is eating & drinking fluids well.  She report more frequent BM (2-3) but not diarrhea.  She has not taken any compazine b/c she denies nausea.  Encouraged to check her BP at home & increase fluids to see if this helps dizziness & to call if no better.  She expressed appreciation for call & knows her next appt & how to reach [place].

## 2022-05-04 NOTE — Telephone Encounter (Signed)
-----   Message from Bradd Burner, RN sent at 05/01/2022  2:25 PM EDT ----- Regarding: Dr. Bertis Ruddy - first time docetaxel, f/u call.  Pt tolerated well

## 2022-05-04 NOTE — Addendum Note (Signed)
Encounter addended by: Arby Barrette on: 05/04/2022 4:06 PM  Actions taken: Imaging Exam ended, Charge Capture section accepted

## 2022-05-04 NOTE — Progress Notes (Signed)
Pt is approved for the $1000 [employer] grant.  

## 2022-05-05 ENCOUNTER — Other Ambulatory Visit: Payer: Self-pay

## 2022-05-13 ENCOUNTER — Other Ambulatory Visit: Payer: Self-pay

## 2022-05-21 MED FILL — Fosaprepitant Dimeglumine For IV Infusion 150 MG (Base Eq): INTRAVENOUS | Qty: 5 | Status: AC

## 2022-05-21 MED FILL — Dexamethasone Sodium Phosphate Inj 100 MG/10ML: INTRAMUSCULAR | Qty: 1 | Status: AC

## 2022-05-22 ENCOUNTER — Other Ambulatory Visit: Payer: Self-pay

## 2022-05-22 ENCOUNTER — Other Ambulatory Visit (HOSPITAL_COMMUNITY): Payer: Self-pay

## 2022-05-22 ENCOUNTER — Inpatient Hospital Stay: Payer: Self-pay | Attending: Psychiatry

## 2022-05-22 ENCOUNTER — Encounter: Payer: Self-pay | Admitting: Hematology and Oncology

## 2022-05-22 ENCOUNTER — Inpatient Hospital Stay: Payer: Self-pay

## 2022-05-22 ENCOUNTER — Telehealth: Payer: Self-pay | Admitting: Oncology

## 2022-05-22 ENCOUNTER — Inpatient Hospital Stay (HOSPITAL_BASED_OUTPATIENT_CLINIC_OR_DEPARTMENT_OTHER): Payer: Self-pay | Admitting: Hematology and Oncology

## 2022-05-22 VITALS — BP 142/89 | HR 85 | Resp 16

## 2022-05-22 VITALS — BP 144/81 | HR 92 | Temp 97.8°F | Resp 18 | Ht 60.0 in | Wt 228.4 lb

## 2022-05-22 DIAGNOSIS — Z5111 Encounter for antineoplastic chemotherapy: Secondary | ICD-10-CM | POA: Insufficient documentation

## 2022-05-22 DIAGNOSIS — E119 Type 2 diabetes mellitus without complications: Secondary | ICD-10-CM

## 2022-05-22 DIAGNOSIS — Z5112 Encounter for antineoplastic immunotherapy: Secondary | ICD-10-CM | POA: Insufficient documentation

## 2022-05-22 DIAGNOSIS — I82C11 Acute embolism and thrombosis of right internal jugular vein: Secondary | ICD-10-CM

## 2022-05-22 DIAGNOSIS — Z86718 Personal history of other venous thrombosis and embolism: Secondary | ICD-10-CM | POA: Insufficient documentation

## 2022-05-22 DIAGNOSIS — C541 Malignant neoplasm of endometrium: Secondary | ICD-10-CM

## 2022-05-22 DIAGNOSIS — Z90722 Acquired absence of ovaries, bilateral: Secondary | ICD-10-CM | POA: Insufficient documentation

## 2022-05-22 DIAGNOSIS — E66813 Obesity, class 3: Secondary | ICD-10-CM

## 2022-05-22 DIAGNOSIS — Z90710 Acquired absence of both cervix and uterus: Secondary | ICD-10-CM | POA: Insufficient documentation

## 2022-05-22 DIAGNOSIS — E1165 Type 2 diabetes mellitus with hyperglycemia: Secondary | ICD-10-CM | POA: Insufficient documentation

## 2022-05-22 DIAGNOSIS — Z7952 Long term (current) use of systemic steroids: Secondary | ICD-10-CM | POA: Insufficient documentation

## 2022-05-22 DIAGNOSIS — Z79899 Other long term (current) drug therapy: Secondary | ICD-10-CM | POA: Insufficient documentation

## 2022-05-22 DIAGNOSIS — Z9079 Acquired absence of other genital organ(s): Secondary | ICD-10-CM | POA: Insufficient documentation

## 2022-05-22 DIAGNOSIS — Z7984 Long term (current) use of oral hypoglycemic drugs: Secondary | ICD-10-CM | POA: Insufficient documentation

## 2022-05-22 DIAGNOSIS — Z7901 Long term (current) use of anticoagulants: Secondary | ICD-10-CM | POA: Insufficient documentation

## 2022-05-22 LAB — CBC WITH DIFFERENTIAL (CANCER CENTER ONLY)
Abs Immature Granulocytes: 0.02 10*3/uL (ref 0.00–0.07)
Basophils Absolute: 0 10*3/uL (ref 0.0–0.1)
Basophils Relative: 0 %
Eosinophils Absolute: 0 10*3/uL (ref 0.0–0.5)
Eosinophils Relative: 0 %
HCT: 39.9 % (ref 36.0–46.0)
Hemoglobin: 12.7 g/dL (ref 12.0–15.0)
Immature Granulocytes: 0 %
Lymphocytes Relative: 17 %
Lymphs Abs: 1.1 10*3/uL (ref 0.7–4.0)
MCH: 25.5 pg — ABNORMAL LOW (ref 26.0–34.0)
MCHC: 31.8 g/dL (ref 30.0–36.0)
MCV: 80.1 fL (ref 80.0–100.0)
Monocytes Absolute: 0.1 10*3/uL (ref 0.1–1.0)
Monocytes Relative: 2 %
Neutro Abs: 5.5 10*3/uL (ref 1.7–7.7)
Neutrophils Relative %: 81 %
Platelet Count: 268 10*3/uL (ref 150–400)
RBC: 4.98 MIL/uL (ref 3.87–5.11)
RDW: 20.7 % — ABNORMAL HIGH (ref 11.5–15.5)
WBC Count: 6.8 10*3/uL (ref 4.0–10.5)
nRBC: 0 % (ref 0.0–0.2)

## 2022-05-22 LAB — CMP (CANCER CENTER ONLY)
ALT: 29 U/L (ref 0–44)
AST: 18 U/L (ref 15–41)
Albumin: 4.2 g/dL (ref 3.5–5.0)
Alkaline Phosphatase: 80 U/L (ref 38–126)
Anion gap: 8 (ref 5–15)
BUN: 13 mg/dL (ref 6–20)
CO2: 26 mmol/L (ref 22–32)
Calcium: 9.5 mg/dL (ref 8.9–10.3)
Chloride: 100 mmol/L (ref 98–111)
Creatinine: 0.54 mg/dL (ref 0.44–1.00)
GFR, Estimated: >60 mL/min (ref >60)
Glucose, Bld: 322 mg/dL — ABNORMAL HIGH (ref 70–99)
Potassium: 4.3 mmol/L (ref 3.5–5.1)
Sodium: 134 mmol/L — ABNORMAL LOW (ref 135–145)
Total Bilirubin: 0.2 mg/dL — ABNORMAL LOW (ref 0.3–1.2)
Total Protein: 7.9 g/dL (ref 6.5–8.1)

## 2022-05-22 LAB — HEMOGLOBIN A1C
Hgb A1c MFr Bld: 8.7 % — ABNORMAL HIGH (ref 4.8–5.6)
Mean Plasma Glucose: 202.99 mg/dL

## 2022-05-22 MED ORDER — SODIUM CHLORIDE 0.9 % IV SOLN
500.0000 mg | Freq: Once | INTRAVENOUS | Status: AC
  Administered 2022-05-22: 500 mg via INTRAVENOUS
  Filled 2022-05-22: qty 10

## 2022-05-22 MED ORDER — SODIUM CHLORIDE 0.9% FLUSH
10.0000 mL | Freq: Once | INTRAVENOUS | Status: AC
  Administered 2022-05-22: 10 mL

## 2022-05-22 MED ORDER — INSULIN ASPART 100 UNIT/ML IJ SOLN
10.0000 [IU] | Freq: Once | INTRAMUSCULAR | Status: AC
  Administered 2022-05-22: 10 [IU] via SUBCUTANEOUS
  Filled 2022-05-22: qty 1

## 2022-05-22 MED ORDER — SODIUM CHLORIDE 0.9% FLUSH
10.0000 mL | INTRAVENOUS | Status: DC | PRN
  Administered 2022-05-22: 10 mL

## 2022-05-22 MED ORDER — SODIUM CHLORIDE 0.9 % IV SOLN: Freq: Once | INTRAVENOUS | Status: AC

## 2022-05-22 MED ORDER — FAMOTIDINE 20 MG IN NS 100 ML IVPB
20.0000 mg | Freq: Once | INTRAVENOUS | Status: AC
  Administered 2022-05-22: 20 mg via INTRAVENOUS
  Filled 2022-05-22: qty 100

## 2022-05-22 MED ORDER — METFORMIN HCL 500 MG PO TABS
500.0000 mg | ORAL_TABLET | Freq: Two times a day (BID) | ORAL | 3 refills | Status: DC
  Filled 2022-05-22: qty 60, 30d supply, fill #0

## 2022-05-22 MED ORDER — CETIRIZINE HCL 10 MG/ML IV SOLN
10.0000 mg | Freq: Once | INTRAVENOUS | Status: AC
  Administered 2022-05-22: 10 mg via INTRAVENOUS
  Filled 2022-05-22: qty 1

## 2022-05-22 MED ORDER — SODIUM CHLORIDE 0.9 % IV SOLN
60.0000 mg/m2 | Freq: Once | INTRAVENOUS | Status: AC
  Administered 2022-05-22: 125 mg via INTRAVENOUS
  Filled 2022-05-22: qty 12.5

## 2022-05-22 MED ORDER — PALONOSETRON HCL INJECTION 0.25 MG/5ML
0.2500 mg | Freq: Once | INTRAVENOUS | Status: AC
  Administered 2022-05-22: 0.25 mg via INTRAVENOUS
  Filled 2022-05-22: qty 5

## 2022-05-22 MED ORDER — SODIUM CHLORIDE 0.9 % IV SOLN
726.5000 mg | Freq: Once | INTRAVENOUS | Status: AC
  Administered 2022-05-22: 730 mg via INTRAVENOUS
  Filled 2022-05-22: qty 73

## 2022-05-22 MED ORDER — SODIUM CHLORIDE 0.9 % IV SOLN
150.0000 mg | Freq: Once | INTRAVENOUS | Status: AC
  Administered 2022-05-22: 150 mg via INTRAVENOUS
  Filled 2022-05-22: qty 150

## 2022-05-22 MED ORDER — HEPARIN SOD (PORK) LOCK FLUSH 100 UNIT/ML IV SOLN
500.0000 [IU] | Freq: Once | INTRAVENOUS | Status: AC | PRN
  Administered 2022-05-22: 500 [IU]

## 2022-05-22 MED ORDER — SODIUM CHLORIDE 0.9 % IV SOLN
10.0000 mg | Freq: Once | INTRAVENOUS | Status: AC
  Administered 2022-05-22: 10 mg via INTRAVENOUS
  Filled 2022-05-22: qty 10

## 2022-05-22 NOTE — Assessment & Plan Note (Signed)
She has new onset of diabetes I will order hemoglobin A1c We discussed risk and benefits of starting her on metformin Unfortunately, I have limited resources to treat her here and we will get hide to get her community resources to be referred to local primary care doctor for management

## 2022-05-22 NOTE — Assessment & Plan Note (Signed)
She tolerated last cycle of therapy well without signs of neuropathy However, the patient now has uncontrolled diabetes with progressive weight gain while on chemotherapy We discussed the importance of dietary modification and weight loss She will get 1 dose of insulin today I will order hemoglobin A1c Due to her uninsured status, we will try to get her community resources to be seen and have her diabetes managed

## 2022-05-22 NOTE — Telephone Encounter (Signed)
Called [facility] and scheduled [facility] on 06/15/22 at 8:20 with [facility] . [name], FNP.  Notified [place] of appointment and provided her with a printed calendar of upcoming appointments.

## 2022-05-22 NOTE — Progress Notes (Signed)
[facility] OFFICE PROGRESS NOTE  Patient Care Team: Patient, No Pcp Per as PCP - General (General Practice)  ASSESSMENT & PLAN:  Endometrial cancer (HCC) She tolerated last cycle of therapy well without signs of neuropathy However, the patient now has uncontrolled diabetes with progressive weight gain while on chemotherapy We discussed the importance of dietary modification and weight loss She will get 1 dose of insulin today I will order hemoglobin A1c Due to her uninsured status, we will try to get her community resources to be seen and have her diabetes managed  Diabetes mellitus type 2, uncomplicated (HCC) She has new onset of diabetes I will order hemoglobin A1c We discussed risk and benefits of starting her on metformin Unfortunately, I have limited resources to treat her here and we will get hide to get her community resources to be referred to local primary care doctor for management  Acute embolism from right internal jugular vein (HCC) She is doing well on anticoagulation therapy She will continue the same  Orders Placed This Encounter  Procedures   Hemoglobin A1c    Standing Status:   Future    Number of Occurrences:   1    Standing Expiration Date:   05/22/2023    All questions were answered. The patient knows to call the clinic with any problems, questions or concerns. The total time spent in the appointment was 40 minutes encounter with patients including review of chart and various tests results, discussions about plan of care and coordination of care plan   Artis Delay, MD 05/22/2022 9:26 AM  INTERVAL HISTORY: Please see below for problem oriented charting. she returns for treatment follow-up seen prior to cycle 4 of treatment We discussed recent test results She has family history of diabetes in her sister The patient has not been adhering to healthy diet She denies bleeding complications from recent chemotherapy She has occasional rare  nausea Denies recent constipation  REVIEW OF SYSTEMS:   Constitutional: Denies fevers, chills or abnormal weight loss Eyes: Denies blurriness of vision Ears, nose, mouth, throat, and face: Denies mucositis or sore throat Respiratory: Denies cough, dyspnea or wheezes Cardiovascular: Denies palpitation, chest discomfort or lower extremity swelling Gastrointestinal:  Denies nausea, heartburn or change in bowel habits Skin: Denies abnormal skin rashes Lymphatics: Denies new lymphadenopathy or easy bruising Neurological:Denies numbness, tingling or new weaknesses Behavioral/Psych: Mood is stable, no new changes  All other systems were reviewed with the patient and are negative.  I have reviewed the past medical history, past surgical history, social history and family history with the patient and they are unchanged from previous note.  ALLERGIES:  is allergic to paclitaxel and advil [ibuprofen].  MEDICATIONS:  Current Outpatient Medications  Medication Sig Dispense Refill   metFORMIN (GLUCOPHAGE) 500 MG tablet Take 1 tablet (500 mg total) by mouth 2 (two) times daily with a meal. 60 tablet 3   apixaban (ELIQUIS) 5 MG TABS tablet Take 1 tablet (5 mg total) by mouth 2 (two) times daily. 60 tablet 3   dexamethasone (DECADRON) 4 MG tablet Take 2 tabs the day before chemo and 1 tab daily for 2 days after chemotherapy, every 3 weeks, by mouth x 6 cycles 24 tablet 6   lidocaine-prilocaine (EMLA) cream Apply to affected area once 30 g 3   ondansetron (ZOFRAN) 8 MG tablet Take 1 tablet (8 mg total) by mouth every 8 (eight) hours as needed for nausea or vomiting. Start on the third day after chemotherapy. 30  tablet 1   prochlorperazine (COMPAZINE) 10 MG tablet Take 1 tablet (10 mg total) by mouth every 6 (six) hours as needed for nausea or vomiting. 30 tablet 1   No current facility-administered medications for this visit.    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 Endometrial thickening possibly due to obstruction from the cystic mass. Direct visualization is recommended.   Enlarged right para-aortic and right external iliac nodes are indeterminate but concerning for metastasis given cervical lesion.   Borderline enlargement and fecalization of the distal ileum. No definite transition point. Findings suggestive of slow transit/constipation.   01/14/2022 Initial Biopsy   Endometrial biopsy and ECC: A. ENDOCERVIX, CURETTAGE: - BENIGN ENDOCERVICAL MUCOSA WITH ACUTE AND CHRONIC INFLAMMATION - NEGATIVE FOR MALIGNANCY  B. 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  The tumor cells are partially positive for p16. The p53 stain shows foci with wild-type staining and foci with overexpression.  The tumor cells show patchy positivity for ER and PR and are negative for Napsin A.  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  Drs. Arthur Holms reviewed the case and agree with the above diagnosis.  Clinical correlation recommended.    01/14/2022 Initial Diagnosis   Endometrial cancer (HCC)   01/24/2022 Imaging   MR pelvis 1. There is an enhancing mass identified within the lower uterine segment which measures 4.3 x 3.9 by 4.7 cm. This invades the anterior myometrium within the lower uterine segment. This appears to terminate at the level of the internal os of the cervix. Findings are compatible with endometrial carcinoma. 2. Aortocaval, right common iliac, and bilateral  pelvic sidewall lymph nodes are enlarged compatible with metastatic adenopathy. Given the presence of periaortic and pelvic node involvement imaging findings are compatible with stage IIIC2 disease.   02/06/2022 PET scan   1. Diffusely increased uptake within the endometrium and cervix compatible with known endometrial carcinoma. 2. Tracer avid retroperitoneal and pelvic lymph nodes compatible with nodal metastasis. 3. No signs of solid organ metastasis or distant metastatic disease. 4. Nonspecific, diffuse increased bone marrow uptake is noted within the axial and appendicular skeleton. No focal areas of increased uptake to suggest osseous metastasis. 5. Multifocal bilateral areas of fibrosis, architectural distortion and scarring within both lungs which is favored to represent a inflammatory or infectious process. Findings may be the sequelae of prior atypical viral infection.   02/10/2022 Pathology Results   A. RIGHT AORTOCAVAL LYMPH NODE, EXCISION: Two benign lymph nodes, negative for carcinoma (0/2)  B. RIGHT PELVIC LYMPH NODE, EXCISION: Two of two lymph nodes with metastatic adenocarcinoma (2/2)  C. LEFT OBTURATOR PELVIC LYMPH NODE, EXCISION: Three lymph nodes, negative for carcinoma (0/3)  D. UTERUS WITH RIGHT AND LEFT FALLOPIAN TUBE AND OVARY, HYSTERECTOMY AND BILATERAL SALPINGO-OOPHORECTOMY: Invasive moderate to poorly differentiated endometrioid adenocarcinoma, FIGO 3 Tumor measures 5.8 x 3.0 and invades 1.0 cm and 30% of the myometrium (10 of 33 mm) (pT1a) Tumor extends into the anterior lower uterine segment Background complex atypical hyperplasia (EIN) Angiolymphatic invasion present Focal adenomyosis Benign leiomyoma, intramural, measuring 0.6 cm in greatest  dimension Chronic cervicitis with squamous metaplasia Benign cystic follicles and luteal cyst of left ovary Bilateral hydrosalpinx Benign right ovary  E. RIGHT POSTERIOR CERVICAL MARGIN, EXCISION: Benign cervical  stroma Negative for carcinoma  ONCOLOGY TABLE:  UTERUS, CARCINOMA OR CARCINOSARCOMA: Resection  Procedure: Total hysterectomy and bilateral salpingo-oophorectomy with node sampling Histologic Type: Endometrioid adenocarcinoma Histologic Grade: High-grade, FIGO 3 Myometrial Invasion:      Depth of Myometrial Invasion (mm): 10 mm      Myometrial Thickness (mm): 33 mm      Percentage of Myometrial Invasion: 30% Uterine Serosa Involvement: Not identified Cervical stromal Involvement: Not identified Extent of involvement of other tissue/organs: Not identified Peritoneal/Ascitic Fluid: []  Lymphovascular Invasion: Present Regional Lymph Nodes:      Pelvic Lymph Nodes Examined: 5 nonsentinel lymph nodes      Pelvic Lymph Nodes with Metastasis: 2          Macrometastasis: (>2.0 mm): 2          Micrometastasis: (>0.2 mm and < 2.0 mm): 0          Isolated Tumor Cells (<0.2 mm): 0          Laterality of Lymph Node with Tumor: Right          Extracapsular Extension: Not identified      Para-aortic Lymph Nodes Examined: 2 nonsentinel lymph nodes       Para-aortic Lymph Nodes with Metastasis: 0          Macrometastasis: (>2.0 mm): 0          Micrometastasis: (>0.2 mm and < 2.0 mm): 0          Isolated Tumor Cells (<0.2 mm): 0 Distant Metastasis: Not applicable Pathologic Stage Classification (pTNM, AJCC 8th Edition): pT1a, pN1a  Ancillary Studies: MMR testing has been ordered and the results will be issued within an addendum to this report.    02/10/2022 Surgery   Preoperative Diagnosis: Endometrioid endometrial cancer FIGO grade 2, Morbid obesity (BMI 44)    Procedures: Robotic-assisted total laparoscopic hysterectomy, bilateral salpingo-oophorectomy, bilateral sentinel lymph node injection and debulking of enlarge, PET avid pelvic and para-aortic lymph nodes (Modifer 22: extreme morbid obesity, BMI 44, with significant retroperitoneal and intraperitoneal adiposity requiring additional OR  personnel for positioning and retraction. Obesity made retroperitoneal visualization limited, increasing the complexity of the case and necessitating additional instrumentation for retraction and to create safe exposure. Obesity related complexity increased the duration of the procedure by 60 minutes.)   Surgeon: Clide Cliff, MD    Findings: Normal upper abdominal survey with normal liver surface and diaphragm. Normal appearing small and large bowel. Globally enlarged uterus. Overall normal tubes, and ovaries, small paratubal cyst. No evidence of peritoneal disease, ascites, or carcinomatosis. Enlarged bilateral pelvic and right para-aortic lymph nodes removed. Tumor spill into the vagina and pelvis noted with colpotomy. Pelvis and vagina copiously irrigated. Significant intra-abdominal adiposity.   02/23/2022 Cancer Staging   Staging form: Corpus Uteri - Carcinoma and Carcinosarcoma, AJCC 8th Edition - Pathologic stage from 02/23/2022: Stage IIIC1 (pT1a, pN1, cM0) - Signed by Artis Delay, MD on 02/23/2022 Stage prefix: Initial diagnosis   03/05/2022 Procedure   Status post right IJ port catheter placement.    03/20/2022 -  Chemotherapy   Patient is on Treatment Plan : UTERINE ENDOMETRIAL Dostarlimab-gxly (500 mg) + carboplatin + Taxotere q21d x 6 cycles / Dostarlimab-gxly (1000 mg) q42d x 6 cycles     04/01/2022 Genetic Testing   Negative genetic testing on the  CancerNext-Expanded+RNAinsight panel.  MSH3 VUS identified.  The report date is April 01, 2022.  The CancerNext-Expanded gene panel offered by [facility] and includes sequencing and rearrangement analysis for the following 77 genes: AIP, ALK, APC*, ATM*, AXIN2, BAP1, BARD1, BMPR1A, BRCA1*, BRCA2*, BRIP1*, CDC73, CDH1*, CDK4, CDKN1B, CDKN2A, CHEK2*, CTNNA1, DICER1, FH, FLCN, KIF1B, LZTR1, MAX, MEN1, MET, MLH1*, MSH2*, MSH3, MSH6*, MUTYH*, NF1*, NF2, NTHL1, PALB2*, PHOX2B, PMS2*, POT1, PRKAR1A, PTCH1, PTEN*, RAD51C*, RAD51D*, RB1, RET,  SDHA, SDHAF2, SDHB, SDHC, SDHD, SMAD4, SMARCA4, SMARCB1, SMARCE1, STK11, SUFU, TMEM127, TP53*, TSC1, TSC2, and VHL (sequencing and deletion/duplication); EGFR, EGLN1, HOXB13, KIT, MITF, PDGFRA, POLD1, and POLE (sequencing only); EPCAM and GREM1 (deletion/duplication only). DNA and RNA analyses performed for * genes.    04/01/2022 Procedure   Right:  No evidence of superficial vein thrombosis in the upper extremity.  Findings  consistent with acute deep vein thrombosis involving the right internal jugular vein, right subclavian vein and right brachial veins.    Left:  No evidence of thrombosis in the subclavian.      PHYSICAL EXAMINATION: ECOG PERFORMANCE STATUS: 1 - Symptomatic but completely ambulatory  Vitals:   05/22/22 0840  BP: (!) 144/81  Pulse: 92  Resp: 18  Temp: 97.8 F (36.6 C)  SpO2: 99%   Filed Weights   05/22/22 0840  Weight: 228 lb 6.4 oz (103.6 kg)    GENERAL:alert, no distress and comfortable  NEURO: alert & oriented x 3 with fluent speech, no focal motor/sensory deficits  LABORATORY DATA:  I have reviewed the data as listed    Component Value Date/Time   NA 134 (L) 05/22/2022 0813   K 4.3 05/22/2022 0813   CL 100 05/22/2022 0813   CO2 26 05/22/2022 0813   GLUCOSE 322 (H) 05/22/2022 0813   BUN 13 05/22/2022 0813   CREATININE 0.54 05/22/2022 0813   CALCIUM 9.5 05/22/2022 0813   PROT 7.9 05/22/2022 0813   ALBUMIN 4.2 05/22/2022 0813   AST 18 05/22/2022 0813   ALT 29 05/22/2022 0813   ALKPHOS 80 05/22/2022 0813   BILITOT 0.2 (L) 05/22/2022 0813   GFRNONAA >60 05/22/2022 0813   GFRAA >90 12/13/2011 2039    No results found for: "SPEP", "UPEP"  Lab Results  Component Value Date   WBC 6.8 05/22/2022   NEUTROABS 5.5 05/22/2022   HGB 12.7 05/22/2022   HCT 39.9 05/22/2022   MCV 80.1 05/22/2022   PLT 268 05/22/2022      Chemistry      Component Value Date/Time   NA 134 (L) 05/22/2022 0813   K 4.3 05/22/2022 0813   CL 100 05/22/2022 0813    CO2 26 05/22/2022 0813   BUN 13 05/22/2022 0813   CREATININE 0.54 05/22/2022 0813      Component Value Date/Time   CALCIUM 9.5 05/22/2022 0813   ALKPHOS 80 05/22/2022 0813   AST 18 05/22/2022 0813   ALT 29 05/22/2022 0813   BILITOT 0.2 (L) 05/22/2022 0813

## 2022-05-22 NOTE — Assessment & Plan Note (Signed)
She is doing well on anticoagulation therapy She will continue the same

## 2022-05-22 NOTE — Patient Instructions (Addendum)
[facility]  Discharge Instructions: Thank you for choosing [facility] to provide your oncology and hematology care.   If you have a lab appointment with the Cancer Center, please go directly to the Cancer Center and check in at the registration area.   Wear comfortable clothing and clothing appropriate for easy access to any Portacath or PICC line.   We strive to give you quality time with your provider. You may need to reschedule your appointment if you arrive late (15 or more minutes).  Arriving late affects you and other patients whose appointments are after yours.  Also, if you miss three or more appointments without notifying the office, you may be dismissed from the clinic at the provider's discretion.      For prescription refill requests, have your pharmacy contact our office and allow 72 hours for refills to be completed.    Today you received the following chemotherapy and/or immunotherapy agents: Jemperli/Taxol/Carboplatin      To help prevent nausea and vomiting after your treatment, we encourage you to take your nausea medication as directed.  BELOW ARE SYMPTOMS THAT SHOULD BE REPORTED IMMEDIATELY: *FEVER GREATER THAN 100.4 F (38 C) OR HIGHER *CHILLS OR SWEATING *NAUSEA AND VOMITING THAT IS NOT CONTROLLED WITH YOUR NAUSEA MEDICATION *UNUSUAL SHORTNESS OF BREATH *UNUSUAL BRUISING OR BLEEDING *URINARY PROBLEMS (pain or burning when urinating, or frequent urination) *BOWEL PROBLEMS (unusual diarrhea, constipation, pain near the anus) TENDERNESS IN MOUTH AND THROAT WITH OR WITHOUT PRESENCE OF ULCERS (sore throat, sores in mouth, or a toothache) UNUSUAL RASH, SWELLING OR PAIN  UNUSUAL VAGINAL DISCHARGE OR ITCHING   Items with * indicate a potential emergency and should be followed up as soon as possible or go to the Emergency Department if any problems should occur.  Please show the CHEMOTHERAPY ALERT CARD or IMMUNOTHERAPY  ALERT CARD at check-in to the Emergency Department and triage nurse.  Should you have questions after your visit or need to cancel or reschedule your appointment, please contact [facility]  Dept: [phone]  and follow the prompts.  Office hours are 8:00 a.m. to 4:30 p.m. Monday - Friday. Please note that voicemails left after 4:00 p.m. may not be returned until the following business day.  We are closed weekends and major holidays. You have access to a nurse at all times for urgent questions. Please call the main number to the clinic Dept: [phone] and follow the prompts.   For any non-urgent questions, you may also contact your provider using MyChart. We now offer e-Visits for anyone 18 and older to request care online for non-urgent symptoms. For details visit [email or web address].   Also download the MyChart app! Go to the app store, search "MyChart", open the app, select [place], and log in with your MyChart username and password.  

## 2022-05-23 ENCOUNTER — Other Ambulatory Visit: Payer: Self-pay

## 2022-05-26 ENCOUNTER — Other Ambulatory Visit: Payer: Self-pay

## 2022-05-26 MED ORDER — APIXABAN 5 MG PO TABS: 5.0000 mg | ORAL_TABLET | Freq: Two times a day (BID) | ORAL | 3 refills | Status: DC

## 2022-05-29 NOTE — Progress Notes (Signed)
Completed [employer] Patient Database administrator for [employer]. Fax transmission confirmation received.

## 2022-06-09 ENCOUNTER — Other Ambulatory Visit: Payer: Self-pay

## 2022-06-09 ENCOUNTER — Other Ambulatory Visit (HOSPITAL_COMMUNITY): Payer: Self-pay

## 2022-06-09 ENCOUNTER — Telehealth: Payer: Self-pay

## 2022-06-09 MED ORDER — APIXABAN 5 MG PO TABS
5.0000 mg | ORAL_TABLET | Freq: Two times a day (BID) | ORAL | 3 refills | Status: DC
  Filled 2022-06-09: qty 60, 30d supply, fill #0

## 2022-06-09 NOTE — Telephone Encounter (Signed)
Returned her call. She is requesting Eliquis refill to [facility] pharmacy. Rx sent. She will call the office back with questions.

## 2022-06-09 NOTE — Telephone Encounter (Signed)
Called her back after sending Eliquis refill to [facility]. Eliquis refill on hold with [facility]. Reminded her that she got approval for free Eliquis from [employer] patient assistance foundation 06/04/22 to 06/03/23. Called the # [facility] and spoke with staff. The pharmacy has reached out to [place] and left a message for her to the pharmacy to confirm address so they can set up delivery. Given [place] phone # [phone] to call to confirm address. She will call.

## 2022-06-11 MED FILL — Dexamethasone Sodium Phosphate Inj 100 MG/10ML: INTRAMUSCULAR | Qty: 1 | Status: AC

## 2022-06-11 MED FILL — Fosaprepitant Dimeglumine For IV Infusion 150 MG (Base Eq): INTRAVENOUS | Qty: 5 | Status: AC

## 2022-06-12 ENCOUNTER — Other Ambulatory Visit: Payer: Self-pay

## 2022-06-12 ENCOUNTER — Encounter: Payer: Self-pay | Admitting: Hematology and Oncology

## 2022-06-12 ENCOUNTER — Inpatient Hospital Stay: Payer: Self-pay | Attending: Psychiatry | Admitting: Hematology and Oncology

## 2022-06-12 ENCOUNTER — Inpatient Hospital Stay: Payer: Self-pay

## 2022-06-12 VITALS — BP 146/92 | HR 98 | Temp 98.6°F | Resp 18 | Wt 227.0 lb

## 2022-06-12 DIAGNOSIS — Z79899 Other long term (current) drug therapy: Secondary | ICD-10-CM | POA: Insufficient documentation

## 2022-06-12 DIAGNOSIS — Z7901 Long term (current) use of anticoagulants: Secondary | ICD-10-CM | POA: Insufficient documentation

## 2022-06-12 DIAGNOSIS — Z5112 Encounter for antineoplastic immunotherapy: Secondary | ICD-10-CM | POA: Insufficient documentation

## 2022-06-12 DIAGNOSIS — E1165 Type 2 diabetes mellitus with hyperglycemia: Secondary | ICD-10-CM | POA: Insufficient documentation

## 2022-06-12 DIAGNOSIS — Z7952 Long term (current) use of systemic steroids: Secondary | ICD-10-CM | POA: Insufficient documentation

## 2022-06-12 DIAGNOSIS — E119 Type 2 diabetes mellitus without complications: Secondary | ICD-10-CM

## 2022-06-12 DIAGNOSIS — Z86711 Personal history of pulmonary embolism: Secondary | ICD-10-CM | POA: Insufficient documentation

## 2022-06-12 DIAGNOSIS — Z5111 Encounter for antineoplastic chemotherapy: Secondary | ICD-10-CM | POA: Insufficient documentation

## 2022-06-12 DIAGNOSIS — C541 Malignant neoplasm of endometrium: Secondary | ICD-10-CM

## 2022-06-12 DIAGNOSIS — I82C11 Acute embolism and thrombosis of right internal jugular vein: Secondary | ICD-10-CM

## 2022-06-12 DIAGNOSIS — Z7984 Long term (current) use of oral hypoglycemic drugs: Secondary | ICD-10-CM | POA: Insufficient documentation

## 2022-06-12 LAB — CBC WITH DIFFERENTIAL (CANCER CENTER ONLY)
Abs Immature Granulocytes: 0.04 10*3/uL (ref 0.00–0.07)
Basophils Absolute: 0 10*3/uL (ref 0.0–0.1)
Basophils Relative: 0 %
Eosinophils Absolute: 0 10*3/uL (ref 0.0–0.5)
Eosinophils Relative: 0 %
HCT: 39.8 % (ref 36.0–46.0)
Hemoglobin: 12.8 g/dL (ref 12.0–15.0)
Immature Granulocytes: 0 %
Lymphocytes Relative: 15 %
Lymphs Abs: 1.4 10*3/uL (ref 0.7–4.0)
MCH: 26 pg (ref 26.0–34.0)
MCHC: 32.2 g/dL (ref 30.0–36.0)
MCV: 80.7 fL (ref 80.0–100.0)
Monocytes Absolute: 0.1 10*3/uL (ref 0.1–1.0)
Monocytes Relative: 1 %
Neutro Abs: 7.5 10*3/uL (ref 1.7–7.7)
Neutrophils Relative %: 84 %
Platelet Count: 295 10*3/uL (ref 150–400)
RBC: 4.93 MIL/uL (ref 3.87–5.11)
RDW: 20.3 % — ABNORMAL HIGH (ref 11.5–15.5)
WBC Count: 9.1 10*3/uL (ref 4.0–10.5)
nRBC: 0 % (ref 0.0–0.2)

## 2022-06-12 LAB — CMP (CANCER CENTER ONLY)
ALT: 41 U/L (ref 0–44)
AST: 20 U/L (ref 15–41)
Albumin: 4.1 g/dL (ref 3.5–5.0)
Alkaline Phosphatase: 70 U/L (ref 38–126)
Anion gap: 7 (ref 5–15)
BUN: 12 mg/dL (ref 6–20)
CO2: 26 mmol/L (ref 22–32)
Calcium: 9.2 mg/dL (ref 8.9–10.3)
Chloride: 102 mmol/L (ref 98–111)
Creatinine: 0.56 mg/dL (ref 0.44–1.00)
GFR, Estimated: >60 mL/min (ref >60)
Glucose, Bld: 339 mg/dL — ABNORMAL HIGH (ref 70–99)
Potassium: 4.1 mmol/L (ref 3.5–5.1)
Sodium: 135 mmol/L (ref 135–145)
Total Bilirubin: 0.3 mg/dL (ref 0.3–1.2)
Total Protein: 8.2 g/dL — ABNORMAL HIGH (ref 6.5–8.1)

## 2022-06-12 MED ORDER — HEPARIN SOD (PORK) LOCK FLUSH 100 UNIT/ML IV SOLN
500.0000 [IU] | Freq: Once | INTRAVENOUS | Status: AC | PRN
  Administered 2022-06-12: 500 [IU]

## 2022-06-12 MED ORDER — CETIRIZINE HCL 10 MG/ML IV SOLN
10.0000 mg | Freq: Once | INTRAVENOUS | Status: AC
  Administered 2022-06-12: 10 mg via INTRAVENOUS
  Filled 2022-06-12: qty 1

## 2022-06-12 MED ORDER — SODIUM CHLORIDE 0.9 % IV SOLN: Freq: Once | INTRAVENOUS | Status: AC

## 2022-06-12 MED ORDER — SODIUM CHLORIDE 0.9% FLUSH
10.0000 mL | INTRAVENOUS | Status: DC | PRN
  Administered 2022-06-12: 10 mL

## 2022-06-12 MED ORDER — SODIUM CHLORIDE 0.9 % IV SOLN
150.0000 mg | Freq: Once | INTRAVENOUS | Status: AC
  Administered 2022-06-12: 150 mg via INTRAVENOUS
  Filled 2022-06-12: qty 150

## 2022-06-12 MED ORDER — SODIUM CHLORIDE 0.9 % IV SOLN
10.0000 mg | Freq: Once | INTRAVENOUS | Status: AC
  Administered 2022-06-12: 10 mg via INTRAVENOUS
  Filled 2022-06-12: qty 10

## 2022-06-12 MED ORDER — INSULIN ASPART 100 UNIT/ML IJ SOLN
10.0000 [IU] | Freq: Once | INTRAMUSCULAR | Status: AC
  Administered 2022-06-12: 10 [IU] via SUBCUTANEOUS
  Filled 2022-06-12: qty 1

## 2022-06-12 MED ORDER — PALONOSETRON HCL INJECTION 0.25 MG/5ML
0.2500 mg | Freq: Once | INTRAVENOUS | Status: AC
  Administered 2022-06-12: 0.25 mg via INTRAVENOUS
  Filled 2022-06-12: qty 5

## 2022-06-12 MED ORDER — FAMOTIDINE IN NACL 20-0.9 MG/50ML-% IV SOLN
20.0000 mg | Freq: Once | INTRAVENOUS | Status: AC
  Administered 2022-06-12: 20 mg via INTRAVENOUS
  Filled 2022-06-12: qty 50

## 2022-06-12 MED ORDER — SODIUM CHLORIDE 0.9 % IV SOLN
60.0000 mg/m2 | Freq: Once | INTRAVENOUS | Status: AC
  Administered 2022-06-12: 125 mg via INTRAVENOUS
  Filled 2022-06-12: qty 12.5

## 2022-06-12 MED ORDER — SODIUM CHLORIDE 0.9 % IV SOLN
726.5000 mg | Freq: Once | INTRAVENOUS | Status: AC
  Administered 2022-06-12: 730 mg via INTRAVENOUS
  Filled 2022-06-12: qty 73

## 2022-06-12 MED ORDER — SODIUM CHLORIDE 0.9 % IV SOLN
500.0000 mg | Freq: Once | INTRAVENOUS | Status: AC
  Administered 2022-06-12: 500 mg via INTRAVENOUS
  Filled 2022-06-12: qty 10

## 2022-06-12 MED ORDER — SODIUM CHLORIDE 0.9% FLUSH
10.0000 mL | Freq: Once | INTRAVENOUS | Status: AC
  Administered 2022-06-12: 10 mL

## 2022-06-12 NOTE — Assessment & Plan Note (Signed)
She is doing well on anticoagulation therapy She will continue the same We are able to get her charity care and she had just received 90-day supply of Eliquis

## 2022-06-12 NOTE — Patient Instructions (Signed)
[facility]  Discharge Instructions: Thank you for choosing [facility] to provide your oncology and hematology care.   If you have a lab appointment with the Cancer Center, please go directly to the Cancer Center and check in at the registration area.   Wear comfortable clothing and clothing appropriate for easy access to any Portacath or PICC line.   We strive to give you quality time with your provider. You may need to reschedule your appointment if you arrive late (15 or more minutes).  Arriving late affects you and other patients whose appointments are after yours.  Also, if you miss three or more appointments without notifying the office, you may be dismissed from the clinic at the provider's discretion.      For prescription refill requests, have your pharmacy contact our office and allow 72 hours for refills to be completed.    Today you received the following chemotherapy and/or immunotherapy agents: Jemperli, Docetaxel, Carboplatin.       To help prevent nausea and vomiting after your treatment, we encourage you to take your nausea medication as directed.  BELOW ARE SYMPTOMS THAT SHOULD BE REPORTED IMMEDIATELY: *FEVER GREATER THAN 100.4 F (38 C) OR HIGHER *CHILLS OR SWEATING *NAUSEA AND VOMITING THAT IS NOT CONTROLLED WITH YOUR NAUSEA MEDICATION *UNUSUAL SHORTNESS OF BREATH *UNUSUAL BRUISING OR BLEEDING *URINARY PROBLEMS (pain or burning when urinating, or frequent urination) *BOWEL PROBLEMS (unusual diarrhea, constipation, pain near the anus) TENDERNESS IN MOUTH AND THROAT WITH OR WITHOUT PRESENCE OF ULCERS (sore throat, sores in mouth, or a toothache) UNUSUAL RASH, SWELLING OR PAIN  UNUSUAL VAGINAL DISCHARGE OR ITCHING   Items with * indicate a potential emergency and should be followed up as soon as possible or go to the Emergency Department if any problems should occur.  Please show the CHEMOTHERAPY ALERT CARD or  IMMUNOTHERAPY ALERT CARD at check-in to the Emergency Department and triage nurse.  Should you have questions after your visit or need to cancel or reschedule your appointment, please contact [facility] [facility]  Dept: [phone]  and follow the prompts.  Office hours are 8:00 a.m. to 4:30 p.m. Monday - Friday. Please note that voicemails left after 4:00 p.m. may not be returned until the following business day.  We are closed weekends and major holidays. You have access to a nurse at all times for urgent questions. Please call the main number to the clinic Dept: [phone] and follow the prompts.   For any non-urgent questions, you may also contact your provider using MyChart. We now offer e-Visits for anyone 18 and older to request care online for non-urgent symptoms. For details visit [email or web address].   Also download the MyChart app! Go to the app store, search "MyChart", open the app, select [place], and log in with your MyChart username and password.   

## 2022-06-12 NOTE — Assessment & Plan Note (Signed)
The calculated dose of chemotherapy comes back quite high due to class III obesity We discussed importance of dietary modification during treatment I will keep maximum dose of carboplatin 750 mg 

## 2022-06-12 NOTE — Assessment & Plan Note (Addendum)
She has new onset of diabetes She will continue metformin until her appointment with primary care doctor

## 2022-06-12 NOTE — Progress Notes (Signed)
[facility] OFFICE PROGRESS NOTE  Patient Care Team: Patient, No Pcp Per as PCP - General (General Practice)  ASSESSMENT & PLAN:  Endometrial cancer (HCC) She tolerated last cycle of therapy well without signs of neuropathy However, the patient now has uncontrolled diabetes with progressive weight gain while on chemotherapy We discussed the importance of dietary modification and weight loss She will get 1 dose of insulin today She has appointment to see the primary care doctor next week  Obesity, Class III, BMI 40-49.9 (morbid obesity) (HCC) The calculated dose of chemotherapy comes back quite high due to class III obesity We discussed importance of dietary modification during treatment I will keep maximum dose of carboplatin 750 mg  Diabetes mellitus type 2, uncomplicated (HCC) She has new onset of diabetes She will continue metformin until her appointment with primary care doctor  Acute embolism from right internal jugular vein (HCC) She is doing well on anticoagulation therapy She will continue the same We are able to get her charity care and she had just received 90-day supply of Eliquis  No orders of the defined types were placed in this encounter.   All questions were answered. The patient knows to call the clinic with any problems, questions or concerns. The total time spent in the appointment was 30 minutes encounter with patients including review of chart and various tests results, discussions about plan of care and coordination of care plan   Artis Delay, MD 06/12/2022 10:53 AM  INTERVAL HISTORY: Please see below for problem oriented charting. she returns for treatment follow-up seen in the infusion room prior to cycle 5 of treatment She is delayed of arrival today She tolerated last cycle of therapy well except for mild breakout on her skin Denies peripheral neuropathy No recent changes in bowel habits  REVIEW OF SYSTEMS:   Constitutional: Denies  fevers, chills or abnormal weight loss Eyes: Denies blurriness of vision Ears, nose, mouth, throat, and face: Denies mucositis or sore throat Respiratory: Denies cough, dyspnea or wheezes Cardiovascular: Denies palpitation, chest discomfort or lower extremity swelling Gastrointestinal:  Denies nausea, heartburn or change in bowel habits Skin: Denies abnormal skin rashes Lymphatics: Denies new lymphadenopathy or easy bruising Neurological:Denies numbness, tingling or new weaknesses Behavioral/Psych: Mood is stable, no new changes  All other systems were reviewed with the patient and are negative.  I have reviewed the past medical history, past surgical history, social history and family history with the patient and they are unchanged from previous note.  ALLERGIES:  is allergic to paclitaxel and advil [ibuprofen].  MEDICATIONS:  Current Outpatient Medications  Medication Sig Dispense Refill   apixaban (ELIQUIS) 5 MG TABS tablet Take 1 tablet (5 mg total) by mouth 2 (two) times daily. 180 tablet 3   dexamethasone (DECADRON) 4 MG tablet Take 2 tabs the day before chemo and 1 tab daily for 2 days after chemotherapy, every 3 weeks, by mouth x 6 cycles 24 tablet 6   lidocaine-prilocaine (EMLA) cream Apply to affected area once 30 g 3   metFORMIN (GLUCOPHAGE) 500 MG tablet Take 1 tablet (500 mg total) by mouth 2 (two) times daily with a meal. 60 tablet 3   ondansetron (ZOFRAN) 8 MG tablet Take 1 tablet (8 mg total) by mouth every 8 (eight) hours as needed for nausea or vomiting. Start on the third day after chemotherapy. 30 tablet 1   prochlorperazine (COMPAZINE) 10 MG tablet Take 1 tablet (10 mg total) by mouth every 6 (six) hours as  needed for nausea or vomiting. 30 tablet 1   No current facility-administered medications for this visit.   Facility-Administered Medications Ordered in Other Visits  Medication Dose Route Frequency Provider Last Rate Last Admin   CARBOplatin (PARAPLATIN) 730 mg  in sodium chloride 0.9 % 250 mL chemo infusion  730 mg Intravenous Once Bertis Ruddy, Adelynn Gipe, MD       dexamethasone (DECADRON) 10 mg in sodium chloride 0.9 % 50 mL IVPB  10 mg Intravenous Once Bertis Ruddy, Huber Mathers, MD 204 mL/hr at 06/12/22 1046 10 mg at 06/12/22 1046   DOCEtaxel (TAXOTERE) 125 mg in sodium chloride 0.9 % 250 mL chemo infusion  60 mg/m2 (Treatment Plan Recorded) Intravenous Once Bertis Ruddy, Amethyst Gainer, MD       dostarlimab-gxly (JEMPERLI) 500 mg in sodium chloride 0.9 % 100 mL (4.5455 mg/mL) chemo infusion  500 mg Intravenous Once Bertis Ruddy, Chassie Pennix, MD       fosaprepitant (EMEND) 150 mg in sodium chloride 0.9 % 145 mL IVPB  150 mg Intravenous Once Bertis Ruddy, Dailey Alberson, MD 450 mL/hr at 06/12/22 1047 150 mg at 06/12/22 1047   heparin lock flush 100 unit/mL  500 Units Intracatheter Once PRN Bertis Ruddy, Brandn Mcgath, MD       sodium chloride flush (NS) 0.9 % injection 10 mL  10 mL Intracatheter PRN Artis Delay, MD        SUMMARY OF ONCOLOGIC HISTORY: Oncology History Overview Note  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 Endometrial thickening possibly due to obstruction from the cystic mass. Direct visualization is recommended.   Enlarged right para-aortic and right external iliac nodes are indeterminate but concerning for metastasis given cervical lesion.   Borderline enlargement and fecalization of the distal ileum. No definite transition point. Findings suggestive of slow transit/constipation.   01/14/2022 Initial Biopsy   Endometrial biopsy and ECC: A. ENDOCERVIX, CURETTAGE: - BENIGN ENDOCERVICAL MUCOSA WITH ACUTE AND CHRONIC INFLAMMATION - NEGATIVE FOR MALIGNANCY  B. ENDOMETRIUM, BIOPSY: - ENDOMETRIAL ENDOMETRIOID CARCINOMA WITH CLEAR CELL CHANGE, FIGO GRADE 2 - SEE COMMENT  COMMENT: Immunohistochemical staining for 16, p53, Napsin A, ER and PR  is performed on block B1.  The tumor cells are partially positive for p16. The p53 stain shows foci with wild-type staining and foci with overexpression.  The tumor cells show patchy positivity for ER and PR and are negative for Napsin A.  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  Drs. Arthur Holms reviewed the case and agree with the above diagnosis.  Clinical correlation recommended.    01/14/2022 Initial Diagnosis   Endometrial cancer (HCC)   01/24/2022 Imaging   MR pelvis 1. There is an enhancing mass identified within the lower uterine segment which measures 4.3 x 3.9 by 4.7 cm. This invades the anterior myometrium within the lower uterine segment. This appears to terminate at the level of the internal os of the cervix. Findings are compatible with endometrial carcinoma. 2. Aortocaval, right common iliac, and bilateral pelvic sidewall lymph nodes are enlarged compatible with metastatic adenopathy. Given the presence of periaortic and pelvic node involvement imaging findings are compatible with stage IIIC2 disease.   02/06/2022 PET scan   1. Diffusely increased uptake within the endometrium and cervix compatible with known endometrial carcinoma. 2. Tracer avid retroperitoneal and pelvic lymph nodes compatible with nodal metastasis. 3.  No signs of solid organ metastasis or distant metastatic disease. 4. Nonspecific, diffuse increased bone marrow uptake is noted within the axial and appendicular skeleton. No focal areas of increased uptake to suggest osseous metastasis. 5. Multifocal bilateral areas of fibrosis, architectural distortion and scarring within both lungs which is favored to represent a inflammatory or infectious process. Findings may be the sequelae of prior atypical viral infection.   02/10/2022 Pathology Results   A. RIGHT AORTOCAVAL LYMPH NODE, EXCISION: Two  benign lymph nodes, negative for carcinoma (0/2)  B. RIGHT PELVIC LYMPH NODE, EXCISION: Two of two lymph nodes with metastatic adenocarcinoma (2/2)  C. LEFT OBTURATOR PELVIC LYMPH NODE, EXCISION: Three lymph nodes, negative for carcinoma (0/3)  D. 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 RIGHT POSTERIOR CERVICAL MARGIN, EXCISION: Benign cervical stroma Negative for carcinoma  ONCOLOGY TABLE:  UTERUS, CARCINOMA OR CARCINOSARCOMA: Resection  Procedure: Total hysterectomy and bilateral salpingo-oophorectomy with node sampling Histologic Type: Endometrioid adenocarcinoma Histologic Grade: High-grade, FIGO 3 Myometrial Invasion:      Depth of Myometrial Invasion (mm): 10 mm      Myometrial Thickness (mm): 33 mm      Percentage of Myometrial Invasion: 30% Uterine Serosa Involvement: Not identified Cervical stromal Involvement: Not identified Extent of involvement of other tissue/organs: Not identified Peritoneal/Ascitic Fluid: []  Lymphovascular Invasion: Present Regional Lymph Nodes:      Pelvic Lymph Nodes Examined: 5 nonsentinel lymph nodes      Pelvic Lymph Nodes with Metastasis: 2          Macrometastasis: (>2.0 mm): 2          Micrometastasis: (>0.2 mm and < 2.0 mm): 0          Isolated Tumor Cells (<0.2 mm): 0          Laterality of Lymph Node with Tumor: Right          Extracapsular  Extension: Not identified      Para-aortic Lymph Nodes Examined: 2 nonsentinel lymph nodes       Para-aortic Lymph Nodes with Metastasis: 0          Macrometastasis: (>2.0 mm): 0          Micrometastasis: (>0.2 mm and < 2.0 mm): 0          Isolated Tumor Cells (<0.2 mm): 0 Distant Metastasis: Not applicable Pathologic Stage Classification (pTNM, AJCC 8th Edition): pT1a, pN1a  Ancillary Studies: MMR testing has been ordered and the results will be issued within an addendum to this report.    02/10/2022 Surgery   Preoperative Diagnosis: Endometrioid endometrial cancer FIGO grade 2, Morbid obesity (BMI 44)    Procedures: Robotic-assisted total laparoscopic hysterectomy, bilateral salpingo-oophorectomy, bilateral sentinel lymph node injection and debulking of enlarge, PET avid pelvic and para-aortic lymph nodes (Modifer 22: extreme morbid obesity, BMI 44, with significant retroperitoneal and intraperitoneal adiposity requiring additional OR personnel for positioning and retraction. Obesity made retroperitoneal visualization limited, increasing the complexity of the case and necessitating additional instrumentation for retraction and to create  safe exposure. Obesity related complexity increased the duration of the procedure by 60 minutes.)   Surgeon: Clide Cliff, MD    Findings: Normal upper abdominal survey with normal liver surface and diaphragm. Normal appearing small and large bowel. Globally enlarged uterus. Overall normal tubes, and ovaries, small paratubal cyst. No evidence of peritoneal disease, ascites, or carcinomatosis. Enlarged bilateral pelvic and right para-aortic lymph nodes removed. Tumor spill into the vagina and pelvis noted with colpotomy. Pelvis and vagina copiously irrigated. Significant intra-abdominal adiposity.   02/23/2022 Cancer Staging   Staging form: Corpus Uteri - Carcinoma and Carcinosarcoma, AJCC 8th Edition - Pathologic stage from 02/23/2022: Stage IIIC1 (pT1a, pN1,  cM0) - Signed by Artis Delay, MD on 02/23/2022 Stage prefix: Initial diagnosis   03/05/2022 Procedure   Status post right IJ port catheter placement.    03/20/2022 -  Chemotherapy   Patient is on Treatment Plan : UTERINE ENDOMETRIAL Dostarlimab-gxly (500 mg) + carboplatin + Taxotere q21d x 6 cycles / Dostarlimab-gxly (1000 mg) q42d x 6 cycles     04/01/2022 Genetic Testing   Negative genetic testing on the CancerNext-Expanded+RNAinsight panel.  MSH3 VUS identified.  The report date is April 01, 2022.  The CancerNext-Expanded gene panel offered by [facility] and includes sequencing and rearrangement analysis for the following 77 genes: AIP, ALK, APC*, ATM*, AXIN2, BAP1, BARD1, BMPR1A, BRCA1*, BRCA2*, BRIP1*, CDC73, CDH1*, CDK4, CDKN1B, CDKN2A, CHEK2*, CTNNA1, DICER1, FH, FLCN, KIF1B, LZTR1, MAX, MEN1, MET, MLH1*, MSH2*, MSH3, MSH6*, MUTYH*, NF1*, NF2, NTHL1, PALB2*, PHOX2B, PMS2*, POT1, PRKAR1A, PTCH1, PTEN*, RAD51C*, RAD51D*, RB1, RET, SDHA, SDHAF2, SDHB, SDHC, SDHD, SMAD4, SMARCA4, SMARCB1, SMARCE1, STK11, SUFU, TMEM127, TP53*, TSC1, TSC2, and VHL (sequencing and deletion/duplication); EGFR, EGLN1, HOXB13, KIT, MITF, PDGFRA, POLD1, and POLE (sequencing only); EPCAM and GREM1 (deletion/duplication only). DNA and RNA analyses performed for * genes.    04/01/2022 Procedure   Right:  No evidence of superficial vein thrombosis in the upper extremity.  Findings  consistent with acute deep vein thrombosis involving the right internal jugular vein, right subclavian vein and right brachial veins.    Left:  No evidence of thrombosis in the subclavian.      PHYSICAL EXAMINATION: ECOG PERFORMANCE STATUS: 1 - Symptomatic but completely ambulatory GENERAL:alert, no distress and comfortable SKIN: Noted mild acneform rash NEURO: alert & oriented x 3 with fluent speech, no focal motor/sensory deficits  LABORATORY DATA:  I have reviewed the data as listed    Component Value Date/Time   NA 135  06/12/2022 0933   K 4.1 06/12/2022 0933   CL 102 06/12/2022 0933   CO2 26 06/12/2022 0933   GLUCOSE 339 (H) 06/12/2022 0933   BUN 12 06/12/2022 0933   CREATININE 0.56 06/12/2022 0933   CALCIUM 9.2 06/12/2022 0933   PROT 8.2 (H) 06/12/2022 0933   ALBUMIN 4.1 06/12/2022 0933   AST 20 06/12/2022 0933   ALT 41 06/12/2022 0933   ALKPHOS 70 06/12/2022 0933   BILITOT 0.3 06/12/2022 0933   GFRNONAA >60 06/12/2022 0933   GFRAA >90 12/13/2011 2039    No results found for: "SPEP", "UPEP"  Lab Results  Component Value Date   WBC 9.1 06/12/2022   NEUTROABS 7.5 06/12/2022   HGB 12.8 06/12/2022   HCT 39.8 06/12/2022   MCV 80.7 06/12/2022   PLT 295 06/12/2022      Chemistry      Component Value Date/Time   NA 135 06/12/2022 0933   K 4.1 06/12/2022 0933   CL 102 06/12/2022 0933  CO2 26 06/12/2022 0933   BUN 12 06/12/2022 0933   CREATININE 0.56 06/12/2022 0933      Component Value Date/Time   CALCIUM 9.2 06/12/2022 0933   ALKPHOS 70 06/12/2022 0933   AST 20 06/12/2022 0933   ALT 41 06/12/2022 0933   BILITOT 0.3 06/12/2022 0933

## 2022-06-12 NOTE — Progress Notes (Signed)
Per Bertis Ruddy MD, ok to premed pt with pending CMP results.

## 2022-06-12 NOTE — Assessment & Plan Note (Signed)
She tolerated last cycle of therapy well without signs of neuropathy However, the patient now has uncontrolled diabetes with progressive weight gain while on chemotherapy We discussed the importance of dietary modification and weight loss She will get 1 dose of insulin today She has appointment to see the primary care doctor next week

## 2022-06-12 NOTE — Progress Notes (Signed)
Jemperli 500 mg Lot: [id number]  Exp 11-04-2024 Patient Assistance  Pryor Ochoa, PharmD

## 2022-06-15 ENCOUNTER — Other Ambulatory Visit: Payer: Self-pay | Admitting: Nurse Practitioner

## 2022-06-15 ENCOUNTER — Ambulatory Visit (INDEPENDENT_AMBULATORY_CARE_PROVIDER_SITE_OTHER): Payer: Self-pay | Admitting: Nurse Practitioner

## 2022-06-15 ENCOUNTER — Encounter: Payer: Self-pay | Admitting: Nurse Practitioner

## 2022-06-15 VITALS — BP 122/87 | HR 84 | Temp 97.4°F | Ht 60.0 in | Wt 224.2 lb

## 2022-06-15 DIAGNOSIS — H538 Other visual disturbances: Secondary | ICD-10-CM

## 2022-06-15 DIAGNOSIS — E119 Type 2 diabetes mellitus without complications: Secondary | ICD-10-CM

## 2022-06-15 DIAGNOSIS — K0889 Other specified disorders of teeth and supporting structures: Secondary | ICD-10-CM

## 2022-06-15 DIAGNOSIS — Z1211 Encounter for screening for malignant neoplasm of colon: Secondary | ICD-10-CM

## 2022-06-15 DIAGNOSIS — I82C11 Acute embolism and thrombosis of right internal jugular vein: Secondary | ICD-10-CM

## 2022-06-15 DIAGNOSIS — C541 Malignant neoplasm of endometrium: Secondary | ICD-10-CM

## 2022-06-15 MED ORDER — BLOOD GLUCOSE MONITORING SUPPL DEVI: 1.0000 | Freq: Three times a day (TID) | 0 refills | Status: AC

## 2022-06-15 MED ORDER — LANCETS MISC. MISC: 1.0000 | Freq: Three times a day (TID) | 0 refills | Status: AC

## 2022-06-15 MED ORDER — LANCET DEVICE MISC: 1.0000 | Freq: Three times a day (TID) | 0 refills | Status: AC

## 2022-06-15 MED ORDER — BLOOD GLUCOSE TEST VI STRP: 1.0000 | ORAL_STRIP | Freq: Three times a day (TID) | 0 refills | Status: AC

## 2022-06-15 NOTE — Assessment & Plan Note (Signed)
Lab Results  Component Value Date   HGBA1C 8.7 (H) 05/22/2022  New onset of type 2 diabetes Doing well on metformin 500 mg twice daily Continue metformin 500 mg twice daily Patient counseled on low-carb modified diet Signs and symptoms of hypoglycemia, hypoglycemia reviewed CBG goals discussed, glucose meter ordered Checking urine microalbumin labs, lipid panel Has no history of medullary thyroid cancer/no family history of medullary thyroid cancer, no history of pancreatitis.  We discussed adding a GLP-1 at next visit if A1c is not at goal of less than 7.

## 2022-06-15 NOTE — Assessment & Plan Note (Signed)
Continue Eliquis 5 mg twice daily Appreciate collaboration with hematology

## 2022-06-15 NOTE — Assessment & Plan Note (Signed)
Wt Readings from Last 3 Encounters:  06/15/22 224 lb 3.2 oz (101.7 kg)  06/12/22 227 lb (103 kg)  05/22/22 228 lb 6.4 oz (103.6 kg)   Body mass index is 43.79 kg/m.  Patient counseled on low-carb modified diet She was encouraged to engage in regular moderate exercises at least 150 minutes weekly as tolerated. Consider adding a GLP-1 at next visit is diabetes remains uncontrolled

## 2022-06-15 NOTE — Progress Notes (Signed)
New Patient Office Visit  Subjective:  Patient ID: Maureen Campos, female    DOB: 12-20-1970  Age: 52 y.o. MRN: [id number]  CC:  Chief Complaint  Patient presents with   Establish Care   Diabetes    HPI Maureen Campos is a 52 y.o. female . 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  Has no previous PCP.  Endometrial cancer .  Currently on chemotherapy status post total hysterectomy .patient is being followed by oncology.  Patient denies fever, chills, malaise.  Usually gets tired after her chemotherapy but regains her strength back after some days.   Uncontrolled type 2 diabetes.  Newly diagnosed.  Currently on metformin 500 mg twice daily.  States that her diet can be better, does not drink juice, soda.  Also does not exercise. 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  Currently on Eliquis 5 mg twice daily.  Patient denies hematemesis, hematochezia.   Due for colon cancer screening Cologuard test ordered.  She is also due for mammogram but she is currently uninsured, working on getting her immigration papers in order.  She is currently living with her daughter.  Impaired vision.  Sees an ophthalmologist in [place].  Once a year.  Stated that her vision is getting worse.  Wears prescription glasses.  Needs a referral to an ophthalmologist in [place] area.  She denies pain, discharge, sometimes has itchy eyes closing and opening eyes clears the itchiness.   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)    Diabetes mellitus without  complication (HCC)    Dyspnea    with activity   Endometrial cancer (HCC)    Family history of colon cancer 03/04/2022   Family history of uterine cancer    Fast heart beat    with activity   Fatigue    Hearing loss    Left ear   History of kidney stones    12 years ago   Lazy eye, left    Left leg numbness    occ   Migraine    occ    Past Surgical History:  Procedure Laterality Date   CHOLECYSTECTOMY  2011   IR CHEST FLUORO  04/07/2022   IR CV LINE INJECTION  04/09/2022   IR IMAGING GUIDED PORT INSERTION  03/05/2022   IR PORT REPAIR CENTRAL VENOUS ACCESS DEVICE  04/09/2022   IR US GUIDE VASC ACCESS RIGHT  04/09/2022   ROBOTIC ASSISTED TOTAL HYSTERECTOMY WITH BILATERAL SALPINGO OOPHERECTOMY N/A 02/10/2022   Procedure: XI ROBOTIC ASSISTED TOTAL HYSTERECTOMY WITH BILATERAL SALPINGO OOPHORECTOMY;  Surgeon: Clide Cliff, MD;  Location: [facility];  Service: Gynecology;  Laterality: N/A;   ROBOTIC PELVIC AND PARA-AORTIC LYMPH NODE DISSECTION N/A 02/10/2022   Procedure: XI ROBOTIC PELVIC AND PARA-AORTIC SENTINEL LYMPH NODE DISSECTION;  Surgeon: Clide Cliff, MD;  Location: [facility];  Service: Gynecology;  Laterality: N/A;    Family History  Problem Relation Age of Onset   Colon cancer Father 55   Uterine cancer Sister 66   Melanoma Sister 57   Cancer Paternal Aunt        possible brain cancer, dx >  50   Breast cancer Neg Hx    Ovarian cancer Neg Hx    Endometrial cancer Neg Hx    Pancreatic cancer Neg Hx    Prostate cancer Neg Hx     Social History   Socioeconomic History   Marital status: Significant Other    Spouse name: Not on file   Number of children: 4   Years of education: Not on file   Highest education level: Not on file  Occupational History   Not on file  Tobacco Use   Smoking status: Never   Smokeless tobacco: Never  Vaping Use   Vaping Use: Never used  Substance and Sexual Activity   Alcohol use: Not Currently    Comment: every weekend   Drug use: No    Sexual activity: Yes    Birth control/protection: None  Other Topics Concern   Not on file  Social History Narrative   Lives with her daughter   Social Determinants of Health   Financial Resource Strain: Not on file  Food Insecurity: No Food Insecurity (02/10/2022)   Hunger Vital Sign    Worried About Running Out of Food in the Last Year: Never true    Ran Out of Food in the Last Year: Never true  Transportation Needs: No Transportation Needs (02/10/2022)   PRAPARE - Administrator, Civil Service (Medical): No    Lack of Transportation (Non-Medical): No  Physical Activity: Not on file  Stress: Not on file  Social Connections: Not on file  Intimate Partner Violence: Not At Risk (02/10/2022)   Humiliation, Afraid, Rape, and Kick questionnaire    Fear of Current or Ex-Partner: No    Emotionally Abused: No    Physically Abused: No    Sexually Abused: No    ROS Review of Systems  Constitutional:  Negative for appetite change, chills, diaphoresis and fever.  HENT:  Positive for dental problem. Negative for congestion, drooling, ear discharge, ear pain, facial swelling and hearing loss.   Eyes:  Positive for visual disturbance. Negative for pain, discharge and redness.  Respiratory:  Negative for cough, chest tightness, shortness of breath, wheezing and stridor.   Cardiovascular: Negative.  Negative for chest pain, palpitations and leg swelling.  Gastrointestinal:  Negative for abdominal distention, abdominal pain, anal bleeding, blood in stool and constipation.  Endocrine: Negative for polydipsia, polyphagia and polyuria.  Genitourinary:  Negative for dysuria, enuresis, flank pain and frequency.  Musculoskeletal:  Negative for arthralgias, back pain, gait problem, joint swelling and myalgias.  Skin: Negative.   Neurological:  Negative for seizures, light-headedness, numbness and headaches.  Psychiatric/Behavioral:  Negative for agitation, behavioral problems, confusion and  decreased concentration.     Objective:   Today's Vitals: BP 122/87   Pulse 84   Temp (!) 97.4 F (36.3 C)   Ht 5' (1.524 m)   Wt 224 lb 3.2 oz (101.7 kg)   LMP 01/06/2022 Comment: Hysterectomy 02/10/22  SpO2 99%   BMI 43.79 kg/m   Physical Exam Vitals and nursing note reviewed.  Constitutional:      General: She is not in acute distress.    Appearance: Normal appearance. She is obese. She is not ill-appearing, toxic-appearing or diaphoretic.  HENT:     Right Ear: Tympanic membrane, ear canal and external ear normal. There is no impacted cerumen.     Left Ear: Tympanic membrane, ear canal and external ear normal. There is no impacted cerumen.     Mouth/Throat:  Mouth: Mucous membranes are moist.     Pharynx: Oropharynx is clear. No oropharyngeal exudate or posterior oropharyngeal erythema.  Eyes:     General: No scleral icterus.       Right eye: No discharge.        Left eye: No discharge.     Conjunctiva/sclera: Conjunctivae normal.  Cardiovascular:     Rate and Rhythm: Normal rate and regular rhythm.     Pulses: Normal pulses.     Heart sounds: Normal heart sounds. No murmur heard.    No friction rub. No gallop.  Pulmonary:     Effort: Pulmonary effort is normal. No respiratory distress.     Breath sounds: Normal breath sounds. No stridor. No wheezing, rhonchi or rales.  Chest:     Chest wall: No tenderness.  Abdominal:     General: There is no distension.     Palpations: Abdomen is soft.     Tenderness: There is no abdominal tenderness. There is no right CVA tenderness, left CVA tenderness or guarding.  Musculoskeletal:        General: No swelling, tenderness, deformity or signs of injury.     Right lower leg: No edema.     Left lower leg: No edema.  Skin:    General: Skin is warm and dry.     Capillary Refill: Capillary refill takes less than 2 seconds.     Coloration: Skin is not jaundiced or pale.     Findings: No bruising, erythema or lesion.   Neurological:     Mental Status: She is alert and oriented to person, place, and time.     Motor: No weakness.     Coordination: Coordination normal.     Gait: Gait normal.  Psychiatric:        Mood and Affect: Mood normal.        Behavior: Behavior normal.        Thought Content: Thought content normal.        Judgment: Judgment normal.     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)    Continue Eliquis 5 mg twice daily Appreciate collaboration with hematology         Digestive   Loose, teeth    Stated since she started chemotherapy Patient denies pain, fever, chills Patient encouraged to follow-up with dentist        Endocrine   Diabetes mellitus type 2, uncomplicated (HCC) - Primary    Lab Results  Component Value Date   HGBA1C 8.7 (H) 05/22/2022  New onset of type 2 diabetes Doing well on metformin 500 mg twice daily Continue metformin 500 mg twice daily Patient counseled on low-carb modified diet Signs and symptoms of hypoglycemia, hypoglycemia reviewed CBG goals discussed, glucose meter ordered Checking urine microalbumin labs, lipid panel Has no history of medullary thyroid cancer/no family history of medullary thyroid cancer, no history of pancreatitis.  We discussed adding a GLP-1 at next visit if A1c is not at goal of less than 7.       Relevant Orders   Microalbumin / creatinine urine ratio   Ambulatory referral to Ophthalmology   Lipid panel     Genitourinary   Endometrial cancer [facility])    Currently on chemotherapy Patient encouraged to maintain close follow-up with oncology        Other   Obesity,  Class III, BMI 40-49.9 (morbid obesity) (HCC)    Wt Readings from Last 3 Encounters:  06/15/22 224 lb 3.2 oz (101.7 kg)  06/12/22 227 lb (103 kg)  05/22/22 228 lb 6.4 oz (103.6 kg)   Body mass index is 43.79 kg/m.  Patient counseled  on low-carb modified diet She was encouraged to engage in regular moderate exercises at least 150 minutes weekly as tolerated. Consider adding a GLP-1 at next visit is diabetes remains uncontrolled      Screening for colon cancer   Relevant Orders   Cologuard   Blurry vision, bilateral   Relevant Orders   Ambulatory referral to Ophthalmology    Outpatient Encounter Medications as of 06/15/2022  Medication Sig   apixaban (ELIQUIS) 5 MG TABS tablet Take 1 tablet (5 mg total) by mouth 2 (two) times daily.   dexamethasone (DECADRON) 4 MG tablet Take 2 tabs the day before chemo and 1 tab daily for 2 days after chemotherapy, every 3 weeks, by mouth x 6 cycles   lidocaine-prilocaine (EMLA) cream Apply to affected area once   metFORMIN (GLUCOPHAGE) 500 MG tablet Take 1 tablet (500 mg total) by mouth 2 (two) times daily with a meal.   ondansetron (ZOFRAN) 8 MG tablet Take 1 tablet (8 mg total) by mouth every 8 (eight) hours as needed for nausea or vomiting. Start on the third day after chemotherapy.   prochlorperazine (COMPAZINE) 10 MG tablet Take 1 tablet (10 mg total) by mouth every 6 (six) hours as needed for nausea or vomiting.   No facility-administered encounter medications on file as of 06/15/2022.    Follow-up: Return in about 10 weeks (around 08/24/2022) for DM.   Donell Beers, FNP

## 2022-06-15 NOTE — Addendum Note (Signed)
Addended byRaj Janus on: 06/15/2022 11:29 AM   Modules accepted: Orders

## 2022-06-15 NOTE — Assessment & Plan Note (Signed)
Stated since she started chemotherapy Patient denies pain, fever, chills Patient encouraged to follow-up with dentist

## 2022-06-15 NOTE — Assessment & Plan Note (Signed)
Currently on chemotherapy Patient encouraged to maintain close follow-up with oncology

## 2022-06-15 NOTE — Patient Instructions (Signed)
Please check with the oncologist to see if you can get a Tdap vaccine and shingles vaccine.  Goal for fasting blood sugar ranges from 80 to 120 and 2 hours after any meal or at bedtime should be between 130 to 170.     It is important that you exercise regularly at least 30 minutes 5 times a week as tolerated  Think about what you will eat, plan ahead. Choose " clean, green, fresh or frozen" over canned, processed or packaged foods which are more sugary, salty and fatty. 70 to 75% of food eaten should be vegetables and fruit. Three meals at set times with snacks allowed between meals, but they must be fruit or vegetables. Aim to eat over a 12 hour period , example 7 am to 7 pm, and STOP after  your last meal of the day. Drink water,generally about 64 ounces per day, no other drink is as healthy. Fruit juice is best enjoyed in a healthy way, by EATING the fruit.  Thanks for choosing [facility] we consider it a privelige to serve you.

## 2022-06-16 ENCOUNTER — Other Ambulatory Visit: Payer: Self-pay | Admitting: Nurse Practitioner

## 2022-06-16 ENCOUNTER — Other Ambulatory Visit: Payer: Self-pay

## 2022-06-16 ENCOUNTER — Encounter: Payer: Self-pay | Admitting: Hematology and Oncology

## 2022-06-16 ENCOUNTER — Other Ambulatory Visit (HOSPITAL_COMMUNITY): Payer: Self-pay

## 2022-06-16 DIAGNOSIS — E782 Mixed hyperlipidemia: Secondary | ICD-10-CM

## 2022-06-16 LAB — LIPID PANEL
Chol/HDL Ratio: 6.3 ratio — ABNORMAL HIGH (ref 0.0–4.4)
Cholesterol, Total: 270 mg/dL — ABNORMAL HIGH (ref 100–199)
HDL: 43 mg/dL (ref >39)
LDL Chol Calc (NIH): 177 mg/dL — ABNORMAL HIGH (ref 0–99)
Triglycerides: 261 mg/dL — ABNORMAL HIGH (ref 0–149)
VLDL Cholesterol Cal: 50 mg/dL — ABNORMAL HIGH (ref 5–40)

## 2022-06-16 LAB — MICROALBUMIN / CREATININE URINE RATIO
Creatinine, Urine: 105 mg/dL
Microalb/Creat Ratio: 59 mg/g creat — ABNORMAL HIGH (ref 0–29)
Microalbumin, Urine: 61.6 ug/mL

## 2022-06-16 MED ORDER — ATORVASTATIN CALCIUM 10 MG PO TABS
10.0000 mg | ORAL_TABLET | Freq: Every day | ORAL | 0 refills | Status: DC
Start: 2022-06-16 — End: 2022-08-25
  Filled 2022-06-16: qty 90, 90d supply, fill #0

## 2022-06-22 ENCOUNTER — Other Ambulatory Visit: Payer: Self-pay

## 2022-06-24 ENCOUNTER — Encounter: Payer: Self-pay | Admitting: Hematology and Oncology

## 2022-06-30 ENCOUNTER — Encounter: Payer: Self-pay | Admitting: Hematology and Oncology

## 2022-07-02 MED FILL — Fosaprepitant Dimeglumine For IV Infusion 150 MG (Base Eq): INTRAVENOUS | Qty: 5 | Status: AC

## 2022-07-02 MED FILL — Dexamethasone Sodium Phosphate Inj 100 MG/10ML: INTRAMUSCULAR | Qty: 1 | Status: AC

## 2022-07-03 ENCOUNTER — Inpatient Hospital Stay (HOSPITAL_BASED_OUTPATIENT_CLINIC_OR_DEPARTMENT_OTHER): Payer: Self-pay | Admitting: Hematology and Oncology

## 2022-07-03 ENCOUNTER — Inpatient Hospital Stay: Payer: Self-pay

## 2022-07-03 ENCOUNTER — Encounter: Payer: Self-pay | Admitting: Hematology and Oncology

## 2022-07-03 ENCOUNTER — Other Ambulatory Visit (HOSPITAL_COMMUNITY): Payer: Self-pay

## 2022-07-03 VITALS — HR 91

## 2022-07-03 VITALS — BP 122/72 | HR 101 | Temp 97.8°F | Resp 18 | Ht 60.0 in | Wt 228.2 lb

## 2022-07-03 DIAGNOSIS — C541 Malignant neoplasm of endometrium: Secondary | ICD-10-CM

## 2022-07-03 DIAGNOSIS — E119 Type 2 diabetes mellitus without complications: Secondary | ICD-10-CM

## 2022-07-03 DIAGNOSIS — E66813 Obesity, class 3: Secondary | ICD-10-CM

## 2022-07-03 LAB — CBC WITH DIFFERENTIAL (CANCER CENTER ONLY)
Abs Immature Granulocytes: 0.01 10*3/uL (ref 0.00–0.07)
Basophils Absolute: 0 10*3/uL (ref 0.0–0.1)
Basophils Relative: 0 %
Eosinophils Absolute: 0 10*3/uL (ref 0.0–0.5)
Eosinophils Relative: 0 %
HCT: 36.7 % (ref 36.0–46.0)
Hemoglobin: 11.8 g/dL — ABNORMAL LOW (ref 12.0–15.0)
Immature Granulocytes: 0 %
Lymphocytes Relative: 16 %
Lymphs Abs: 1.3 10*3/uL (ref 0.7–4.0)
MCH: 26.8 pg (ref 26.0–34.0)
MCHC: 32.2 g/dL (ref 30.0–36.0)
MCV: 83.4 fL (ref 80.0–100.0)
Monocytes Absolute: 0.1 10*3/uL (ref 0.1–1.0)
Monocytes Relative: 1 %
Neutro Abs: 6.4 10*3/uL (ref 1.7–7.7)
Neutrophils Relative %: 83 %
Platelet Count: 158 10*3/uL (ref 150–400)
RBC: 4.4 MIL/uL (ref 3.87–5.11)
RDW: 19.7 % — ABNORMAL HIGH (ref 11.5–15.5)
WBC Count: 7.8 10*3/uL (ref 4.0–10.5)
nRBC: 0 % (ref 0.0–0.2)

## 2022-07-03 LAB — CMP (CANCER CENTER ONLY)
ALT: 47 U/L — ABNORMAL HIGH (ref 0–44)
AST: 32 U/L (ref 15–41)
Albumin: 3.8 g/dL (ref 3.5–5.0)
Alkaline Phosphatase: 61 U/L (ref 38–126)
Anion gap: 8 (ref 5–15)
BUN: 9 mg/dL (ref 6–20)
CO2: 25 mmol/L (ref 22–32)
Calcium: 8.9 mg/dL (ref 8.9–10.3)
Chloride: 104 mmol/L (ref 98–111)
Creatinine: 0.46 mg/dL (ref 0.44–1.00)
GFR, Estimated: >60 mL/min (ref >60)
Glucose, Bld: 296 mg/dL — ABNORMAL HIGH (ref 70–99)
Potassium: 4 mmol/L (ref 3.5–5.1)
Sodium: 137 mmol/L (ref 135–145)
Total Bilirubin: 0.4 mg/dL (ref 0.3–1.2)
Total Protein: 7.6 g/dL (ref 6.5–8.1)

## 2022-07-03 LAB — TSH: TSH: 0.64 u[IU]/mL (ref 0.350–4.500)

## 2022-07-03 MED ORDER — SODIUM CHLORIDE 0.9 % IV SOLN
150.0000 mg | Freq: Once | INTRAVENOUS | Status: AC
  Administered 2022-07-03: 150 mg via INTRAVENOUS
  Filled 2022-07-03: qty 150

## 2022-07-03 MED ORDER — FAMOTIDINE IN NACL 20-0.9 MG/50ML-% IV SOLN
20.0000 mg | Freq: Once | INTRAVENOUS | Status: AC
  Administered 2022-07-03: 20 mg via INTRAVENOUS
  Filled 2022-07-03: qty 50

## 2022-07-03 MED ORDER — LIDOCAINE-PRILOCAINE 2.5-2.5 % EX CREA
TOPICAL_CREAM | CUTANEOUS | 3 refills | Status: DC
Start: 2022-07-03 — End: 2023-10-19
  Filled 2022-07-03: qty 30, 1d supply, fill #0
  Filled 2023-03-01: qty 30, 30d supply, fill #1

## 2022-07-03 MED ORDER — CETIRIZINE HCL 10 MG/ML IV SOLN
10.0000 mg | Freq: Once | INTRAVENOUS | Status: AC
  Administered 2022-07-03: 10 mg via INTRAVENOUS
  Filled 2022-07-03: qty 1

## 2022-07-03 MED ORDER — SODIUM CHLORIDE 0.9% FLUSH
10.0000 mL | INTRAVENOUS | Status: DC | PRN
  Administered 2022-07-03: 10 mL

## 2022-07-03 MED ORDER — METFORMIN HCL 500 MG PO TABS
500.0000 mg | ORAL_TABLET | Freq: Two times a day (BID) | ORAL | 3 refills | Status: DC
  Filled 2022-07-03: qty 60, 30d supply, fill #0
  Filled 2022-08-11: qty 60, 30d supply, fill #1

## 2022-07-03 MED ORDER — INSULIN ASPART 100 UNIT/ML IJ SOLN
10.0000 [IU] | Freq: Once | INTRAMUSCULAR | Status: AC
  Administered 2022-07-03: 10 [IU] via SUBCUTANEOUS
  Filled 2022-07-03: qty 1

## 2022-07-03 MED ORDER — HEPARIN SOD (PORK) LOCK FLUSH 100 UNIT/ML IV SOLN
500.0000 [IU] | Freq: Once | INTRAVENOUS | Status: AC | PRN
  Administered 2022-07-03: 500 [IU]

## 2022-07-03 MED ORDER — SODIUM CHLORIDE 0.9% FLUSH
10.0000 mL | Freq: Once | INTRAVENOUS | Status: AC
  Administered 2022-07-03: 10 mL

## 2022-07-03 MED ORDER — SODIUM CHLORIDE 0.9 % IV SOLN
60.0000 mg/m2 | Freq: Once | INTRAVENOUS | Status: AC
  Administered 2022-07-03: 125 mg via INTRAVENOUS
  Filled 2022-07-03: qty 12.5

## 2022-07-03 MED ORDER — SODIUM CHLORIDE 0.9 % IV SOLN
500.0000 mg | Freq: Once | INTRAVENOUS | Status: AC
  Administered 2022-07-03: 500 mg via INTRAVENOUS
  Filled 2022-07-03: qty 10

## 2022-07-03 MED ORDER — PALONOSETRON HCL INJECTION 0.25 MG/5ML
0.2500 mg | Freq: Once | INTRAVENOUS | Status: AC
  Administered 2022-07-03: 0.25 mg via INTRAVENOUS
  Filled 2022-07-03: qty 5

## 2022-07-03 MED ORDER — SODIUM CHLORIDE 0.9 % IV SOLN
726.5000 mg | Freq: Once | INTRAVENOUS | Status: AC
  Administered 2022-07-03: 730 mg via INTRAVENOUS
  Filled 2022-07-03: qty 73

## 2022-07-03 MED ORDER — SODIUM CHLORIDE 0.9 % IV SOLN: Freq: Once | INTRAVENOUS | Status: AC

## 2022-07-03 MED ORDER — SODIUM CHLORIDE 0.9 % IV SOLN
10.0000 mg | Freq: Once | INTRAVENOUS | Status: AC
  Administered 2022-07-03: 10 mg via INTRAVENOUS
  Filled 2022-07-03: qty 10

## 2022-07-03 NOTE — Assessment & Plan Note (Signed)
The calculated dose of chemotherapy comes back quite high due to class III obesity We discussed importance of dietary modification during treatment I will keep maximum dose of carboplatin 750 mg 

## 2022-07-03 NOTE — Progress Notes (Signed)
[facility] OFFICE PROGRESS NOTE  Patient Care Team: Donell Beers, FNP as PCP - General (Nurse Practitioner)  ASSESSMENT & PLAN:  Endometrial cancer [facility]) She tolerated last cycle of therapy well without signs of neuropathy However, the patient now has uncontrolled diabetes with progressive weight gain while on chemotherapy We discussed the importance of dietary modification and weight loss She will get 1 dose of insulin today Plan to repeat imaging study next month before we switch her over to immunotherapy maintenance  Diabetes mellitus type 2, uncomplicated (HCC) She has new onset of diabetes She will continue metformin  I am hopeful that her blood sugar control will improve with discontinuation of chemotherapy after today  Obesity, Class III, BMI 40-49.9 (morbid obesity) (HCC) The calculated dose of chemotherapy comes back quite high due to class III obesity We discussed importance of dietary modification during treatment I will keep maximum dose of carboplatin 750 mg  Orders Placed This Encounter  Procedures   CT CHEST ABDOMEN PELVIS W CONTRAST    Standing Status:   Future    Standing Expiration Date:   07/03/2023    Order Specific Question:   If indicated for the ordered procedure, I authorize the administration of contrast media per Radiology protocol    Answer:   Yes    Order Specific Question:   Does the patient have a contrast media/X-ray dye allergy?    Answer:   No    Order Specific Question:   Is patient pregnant?    Answer:   No    Order Specific Question:   Preferred imaging location?    Answer:   [facility]    Order Specific Question:   If indicated for the ordered procedure, I authorize the administration of oral contrast media per Radiology protocol    Answer:   Yes    All questions were answered. The patient knows to call the clinic with any problems, questions or concerns. The total time spent in the appointment was 30  minutes encounter with patients including review of chart and various tests results, discussions about plan of care and coordination of care plan   Artis Delay, MD 07/03/2022 1:53 PM  INTERVAL HISTORY: Please see below for problem oriented charting. she returns for treatment follow-up I reviewed documentation from her primary care doctor's office She ran out of several medications and did not understand that she can refill by calling her pharmacy Denies peripheral neuropathy We discussed timing of her imaging study and the role of maintenance treatment  REVIEW OF SYSTEMS:   Constitutional: Denies fevers, chills or abnormal weight loss Eyes: Denies blurriness of vision Ears, nose, mouth, throat, and face: Denies mucositis or sore throat Respiratory: Denies cough, dyspnea or wheezes Cardiovascular: Denies palpitation, chest discomfort or lower extremity swelling Gastrointestinal:  Denies nausea, heartburn or change in bowel habits Skin: Denies abnormal skin rashes Lymphatics: Denies new lymphadenopathy or easy bruising Neurological:Denies numbness, tingling or new weaknesses Behavioral/Psych: Mood is stable, no new changes  All other systems were reviewed with the patient and are negative.  I have reviewed the past medical history, past surgical history, social history and family history with the patient and they are unchanged from previous note.  ALLERGIES:  is allergic to paclitaxel and advil [ibuprofen].  MEDICATIONS:  Current Outpatient Medications  Medication Sig Dispense Refill   apixaban (ELIQUIS) 5 MG TABS tablet Take 1 tablet (5 mg total) by mouth 2 (two) times daily. 180 tablet 3  atorvastatin (LIPITOR) 10 MG tablet Take 1 tablet (10 mg total) by mouth daily. 90 tablet 0   Blood Glucose Monitoring Suppl DEVI 1 each by Does not apply route in the morning, at noon, and at bedtime. May substitute to any manufacturer covered by patient's insurance. 1 each 0   Glucose Blood  (BLOOD GLUCOSE TEST STRIPS) STRP 1 each by In Vitro route in the morning, at noon, and at bedtime. May substitute to any manufacturer covered by patient's insurance. 90 each 0   Lancet Device MISC 1 each by Does not apply route in the morning, at noon, and at bedtime. May substitute to any manufacturer covered by patient's insurance. 1 each 0   Lancets Misc. MISC 1 each by Does not apply route in the morning, at noon, and at bedtime. May substitute to any manufacturer covered by patient's insurance. 100 each 0   lidocaine-prilocaine (EMLA) cream Apply to affected area once 30 g 3   metFORMIN (GLUCOPHAGE) 500 MG tablet Take 1 tablet (500 mg total) by mouth 2 (two) times daily with a meal. 60 tablet 3   ondansetron (ZOFRAN) 8 MG tablet Take 1 tablet (8 mg total) by mouth every 8 (eight) hours as needed for nausea or vomiting. Start on the third day after chemotherapy. 30 tablet 1   prochlorperazine (COMPAZINE) 10 MG tablet Take 1 tablet (10 mg total) by mouth every 6 (six) hours as needed for nausea or vomiting. 30 tablet 1   No current facility-administered medications for this visit.   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 Endometrial thickening possibly due to obstruction from the cystic mass. Direct visualization is recommended.   Enlarged right para-aortic and right external iliac nodes are indeterminate but concerning for metastasis given cervical lesion.   Borderline enlargement and fecalization of the distal ileum.  No definite transition point. Findings suggestive of slow transit/constipation.   01/14/2022 Initial Biopsy   Endometrial biopsy and ECC: A. ENDOCERVIX, CURETTAGE: - BENIGN ENDOCERVICAL MUCOSA WITH ACUTE AND CHRONIC INFLAMMATION - NEGATIVE FOR MALIGNANCY  B. 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  The tumor cells are partially positive for p16. The p53 stain shows foci with wild-type staining and foci with overexpression.  The tumor cells show patchy positivity for ER and PR and are negative for Napsin A.  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  Drs. Arthur Holms reviewed the case and agree with the above diagnosis.  Clinical correlation recommended.    01/14/2022 Initial Diagnosis   Endometrial cancer (HCC)   01/24/2022 Imaging   MR pelvis 1. There is an enhancing mass identified within the lower uterine segment which measures 4.3 x 3.9 by 4.7 cm. This invades the anterior myometrium within the lower uterine segment. This appears to terminate at the level of the internal os of  the cervix. Findings are compatible with endometrial carcinoma. 2. Aortocaval, right common iliac, and bilateral pelvic sidewall lymph nodes are enlarged compatible with metastatic adenopathy. Given the presence of periaortic and pelvic node involvement imaging findings are compatible with stage IIIC2 disease.   02/06/2022 PET scan   1. Diffusely increased uptake within the endometrium and cervix compatible with known endometrial carcinoma. 2. Tracer avid retroperitoneal and pelvic lymph nodes compatible with nodal metastasis. 3. No signs of solid organ metastasis or distant metastatic disease. 4. Nonspecific, diffuse  increased bone marrow uptake is noted within the axial and appendicular skeleton. No focal areas of increased uptake to suggest osseous metastasis. 5. Multifocal bilateral areas of fibrosis, architectural distortion and scarring within both lungs which is favored to represent a inflammatory or infectious process. Findings may be the sequelae of prior atypical viral infection.   02/10/2022 Pathology Results   A. RIGHT AORTOCAVAL LYMPH NODE, EXCISION: Two benign lymph nodes, negative for carcinoma (0/2)  B. RIGHT PELVIC LYMPH NODE, EXCISION: Two of two lymph nodes with metastatic adenocarcinoma (2/2)  C. LEFT OBTURATOR PELVIC LYMPH NODE, EXCISION: Three lymph nodes, negative for carcinoma (0/3)  D. 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 RIGHT POSTERIOR CERVICAL MARGIN, EXCISION: Benign cervical stroma Negative for carcinoma  ONCOLOGY TABLE:  UTERUS, CARCINOMA OR CARCINOSARCOMA: Resection  Procedure: Total hysterectomy and bilateral salpingo-oophorectomy with node sampling Histologic Type: Endometrioid adenocarcinoma Histologic Grade: High-grade, FIGO 3 Myometrial Invasion:      Depth of Myometrial Invasion (mm): 10 mm      Myometrial Thickness (mm): 33 mm      Percentage of Myometrial Invasion: 30% Uterine Serosa Involvement: Not identified Cervical stromal Involvement: Not identified Extent  of involvement of other tissue/organs: Not identified Peritoneal/Ascitic Fluid: []  Lymphovascular Invasion: Present Regional Lymph Nodes:      Pelvic Lymph Nodes Examined: 5 nonsentinel lymph nodes      Pelvic Lymph Nodes with Metastasis: 2          Macrometastasis: (>2.0 mm): 2          Micrometastasis: (>0.2 mm and < 2.0 mm): 0          Isolated Tumor Cells (<0.2 mm): 0          Laterality of Lymph Node with Tumor: Right          Extracapsular Extension: Not identified      Para-aortic Lymph Nodes Examined: 2 nonsentinel lymph nodes       Para-aortic Lymph Nodes with Metastasis: 0          Macrometastasis: (>2.0 mm): 0          Micrometastasis: (>0.2 mm and < 2.0 mm): 0          Isolated Tumor Cells (<0.2 mm): 0 Distant Metastasis: Not applicable Pathologic Stage Classification (pTNM, AJCC 8th Edition): pT1a, pN1a  Ancillary Studies: MMR testing has been ordered and the results will be issued within an addendum to this report.    02/10/2022 Surgery   Preoperative Diagnosis: Endometrioid  endometrial cancer FIGO grade 2, Morbid obesity (BMI 44)    Procedures: Robotic-assisted total laparoscopic hysterectomy, bilateral salpingo-oophorectomy, bilateral sentinel lymph node injection and debulking of enlarge, PET avid pelvic and para-aortic lymph nodes (Modifer 22: extreme morbid obesity, BMI 44, with significant retroperitoneal and intraperitoneal adiposity requiring additional OR personnel for positioning and retraction. Obesity made retroperitoneal visualization limited, increasing the complexity of the case and necessitating additional instrumentation for retraction and to create safe exposure. Obesity related complexity increased the duration of the procedure by 60 minutes.)   Surgeon: Clide Cliff, MD    Findings: Normal upper abdominal survey with normal liver surface and diaphragm. Normal appearing small and large bowel. Globally enlarged uterus. Overall normal tubes, and ovaries,  small paratubal cyst. No evidence of peritoneal disease, ascites, or carcinomatosis. Enlarged bilateral pelvic and right para-aortic lymph nodes removed. Tumor spill into the vagina and pelvis noted with colpotomy. Pelvis and vagina copiously irrigated. Significant intra-abdominal adiposity.   02/23/2022 Cancer Staging   Staging form: Corpus Uteri - Carcinoma and Carcinosarcoma, AJCC 8th Edition - Pathologic stage from 02/23/2022: Stage IIIC1 (pT1a, pN1, cM0) - Signed by Artis Delay, MD on 02/23/2022 Stage prefix: Initial diagnosis   03/05/2022 Procedure   Status post right IJ port catheter placement.    03/20/2022 -  Chemotherapy   Patient is on Treatment Plan : UTERINE ENDOMETRIAL Dostarlimab-gxly (500 mg) + carboplatin + Taxotere q21d x 6 cycles / Dostarlimab-gxly (1000 mg) q42d x 6 cycles     04/01/2022 Genetic Testing   Negative genetic testing on the CancerNext-Expanded+RNAinsight panel.  MSH3 VUS identified.  The report date is April 01, 2022.  The CancerNext-Expanded gene panel offered by [facility] and includes sequencing and rearrangement analysis for the following 77 genes: AIP, ALK, APC*, ATM*, AXIN2, BAP1, BARD1, BMPR1A, BRCA1*, BRCA2*, BRIP1*, CDC73, CDH1*, CDK4, CDKN1B, CDKN2A, CHEK2*, CTNNA1, DICER1, FH, FLCN, KIF1B, LZTR1, MAX, MEN1, MET, MLH1*, MSH2*, MSH3, MSH6*, MUTYH*, NF1*, NF2, NTHL1, PALB2*, PHOX2B, PMS2*, POT1, PRKAR1A, PTCH1, PTEN*, RAD51C*, RAD51D*, RB1, RET, SDHA, SDHAF2, SDHB, SDHC, SDHD, SMAD4, SMARCA4, SMARCB1, SMARCE1, STK11, SUFU, TMEM127, TP53*, TSC1, TSC2, and VHL (sequencing and deletion/duplication); EGFR, EGLN1, HOXB13, KIT, MITF, PDGFRA, POLD1, and POLE (sequencing only); EPCAM and GREM1 (deletion/duplication only). DNA and RNA analyses performed for * genes.    04/01/2022 Procedure   Right:  No evidence of superficial vein thrombosis in the upper extremity.  Findings  consistent with acute deep vein thrombosis involving the right internal jugular vein, right  subclavian vein and right brachial veins.    Left:  No evidence of thrombosis in the subclavian.      PHYSICAL EXAMINATION: ECOG PERFORMANCE STATUS: 0 - Asymptomatic  Vitals:   07/03/22 0811  BP: 122/72  Pulse: (!) 101  Resp: 18  Temp: 97.8 F (36.6 C)  SpO2: 98%   Filed Weights   07/03/22 0811  Weight: 228 lb 3.2 oz (103.5 kg)    GENERAL:alert, no distress and comfortable NEURO: alert & oriented x 3 with fluent speech, no focal motor/sensory deficits  LABORATORY DATA:  I have reviewed the data as listed    Component Value Date/Time   NA 137 07/03/2022 0756   K 4.0 07/03/2022 0756   CL 104 07/03/2022 0756   CO2 25 07/03/2022 0756   GLUCOSE 296 (H) 07/03/2022 0756   BUN 9 07/03/2022 0756   CREATININE 0.46 07/03/2022 0756   CALCIUM 8.9 07/03/2022 0756   PROT 7.6 07/03/2022 0756   ALBUMIN 3.8 07/03/2022 0756   AST 32  07/03/2022 0756   ALT 47 (H) 07/03/2022 0756   ALKPHOS 61 07/03/2022 0756   BILITOT 0.4 07/03/2022 0756   GFRNONAA >60 07/03/2022 0756   GFRAA >90 12/13/2011 2039    No results found for: "SPEP", "UPEP"  Lab Results  Component Value Date   WBC 7.8 07/03/2022   NEUTROABS 6.4 07/03/2022   HGB 11.8 (L) 07/03/2022   HCT 36.7 07/03/2022   MCV 83.4 07/03/2022   PLT 158 07/03/2022      Chemistry      Component Value Date/Time   NA 137 07/03/2022 0756   K 4.0 07/03/2022 0756   CL 104 07/03/2022 0756   CO2 25 07/03/2022 0756   BUN 9 07/03/2022 0756   CREATININE 0.46 07/03/2022 0756      Component Value Date/Time   CALCIUM 8.9 07/03/2022 0756   ALKPHOS 61 07/03/2022 0756   AST 32 07/03/2022 0756   ALT 47 (H) 07/03/2022 0756   BILITOT 0.4 07/03/2022 0756

## 2022-07-03 NOTE — Assessment & Plan Note (Signed)
She tolerated last cycle of therapy well without signs of neuropathy However, the patient now has uncontrolled diabetes with progressive weight gain while on chemotherapy We discussed the importance of dietary modification and weight loss She will get 1 dose of insulin today Plan to repeat imaging study next month before we switch her over to immunotherapy maintenance

## 2022-07-03 NOTE — Assessment & Plan Note (Addendum)
She has new onset of diabetes She will continue metformin  I am hopeful that her blood sugar control will improve with discontinuation of chemotherapy after today

## 2022-07-03 NOTE — Patient Instructions (Signed)
[facility]  Discharge Instructions: Thank you for choosing [facility] to provide your oncology and hematology care.   If you have a lab appointment with the Cancer Center, please go directly to the Cancer Center and check in at the registration area.   Wear comfortable clothing and clothing appropriate for easy access to any Portacath or PICC line.   We strive to give you quality time with your provider. You may need to reschedule your appointment if you arrive late (15 or more minutes).  Arriving late affects you and other patients whose appointments are after yours.  Also, if you miss three or more appointments without notifying the office, you may be dismissed from the clinic at the provider's discretion.      For prescription refill requests, have your pharmacy contact our office and allow 72 hours for refills to be completed.    Today you received the following chemotherapy and/or immunotherapy agents Jemperli, Taxotere, Carboplatin      To help prevent nausea and vomiting after your treatment, we encourage you to take your nausea medication as directed.  BELOW ARE SYMPTOMS THAT SHOULD BE REPORTED IMMEDIATELY: *FEVER GREATER THAN 100.4 F (38 C) OR HIGHER *CHILLS OR SWEATING *NAUSEA AND VOMITING THAT IS NOT CONTROLLED WITH YOUR NAUSEA MEDICATION *UNUSUAL SHORTNESS OF BREATH *UNUSUAL BRUISING OR BLEEDING *URINARY PROBLEMS (pain or burning when urinating, or frequent urination) *BOWEL PROBLEMS (unusual diarrhea, constipation, pain near the anus) TENDERNESS IN MOUTH AND THROAT WITH OR WITHOUT PRESENCE OF ULCERS (sore throat, sores in mouth, or a toothache) UNUSUAL RASH, SWELLING OR PAIN  UNUSUAL VAGINAL DISCHARGE OR ITCHING   Items with * indicate a potential emergency and should be followed up as soon as possible or go to the Emergency Department if any problems should occur.  Please show the CHEMOTHERAPY ALERT CARD or  IMMUNOTHERAPY ALERT CARD at check-in to the Emergency Department and triage nurse.  Should you have questions after your visit or need to cancel or reschedule your appointment, please contact [place] [facility]  Dept: [phone]  and follow the prompts.  Office hours are 8:00 a.m. to 4:30 p.m. Monday - Friday. Please note that voicemails left after 4:00 p.m. may not be returned until the following business day.  We are closed weekends and major holidays. You have access to a nurse at all times for urgent questions. Please call the main number to the clinic Dept: [phone] and follow the prompts.   For any non-urgent questions, you may also contact your provider using MyChart. We now offer e-Visits for anyone 64 and older to request care online for non-urgent symptoms. For details visit [email or web address].   Also download the MyChart app! Go to the app store, search "MyChart", open the app, select [place], and log in with your MyChart username and password.

## 2022-07-04 ENCOUNTER — Other Ambulatory Visit: Payer: Self-pay

## 2022-07-05 ENCOUNTER — Other Ambulatory Visit: Payer: Self-pay

## 2022-07-06 LAB — T4: T4, Total: 9.4 ug/dL (ref 4.5–12.0)

## 2022-07-11 ENCOUNTER — Other Ambulatory Visit: Payer: Self-pay

## 2022-07-20 ENCOUNTER — Ambulatory Visit (HOSPITAL_COMMUNITY)
Admission: RE | Admit: 2022-07-20 | Discharge: 2022-07-20 | Disposition: A | Discharge status: Home / Self Care | Payer: Self-pay | Source: Ambulatory Visit | Attending: Hematology and Oncology | Admitting: Hematology and Oncology

## 2022-07-20 DIAGNOSIS — C541 Malignant neoplasm of endometrium: Secondary | ICD-10-CM | POA: Insufficient documentation

## 2022-07-20 MED ORDER — HEPARIN SOD (PORK) LOCK FLUSH 100 UNIT/ML IV SOLN
500.0000 [IU] | Freq: Once | INTRAVENOUS | Status: AC
  Administered 2022-07-20: 500 [IU] via INTRAVENOUS

## 2022-07-20 MED ORDER — IOHEXOL 300 MG/ML  SOLN
100.0000 mL | Freq: Once | INTRAMUSCULAR | Status: AC | PRN
  Administered 2022-07-20: 100 mL via INTRAVENOUS

## 2022-07-22 ENCOUNTER — Other Ambulatory Visit: Payer: Self-pay

## 2022-07-22 DIAGNOSIS — Z1231 Encounter for screening mammogram for malignant neoplasm of breast: Secondary | ICD-10-CM

## 2022-07-24 ENCOUNTER — Encounter: Payer: Self-pay | Admitting: Oncology

## 2022-07-24 ENCOUNTER — Inpatient Hospital Stay: Payer: Self-pay | Attending: Hematology and Oncology | Admitting: Hematology and Oncology

## 2022-07-24 ENCOUNTER — Telehealth: Payer: Self-pay

## 2022-07-24 ENCOUNTER — Encounter: Payer: Self-pay | Admitting: Hematology and Oncology

## 2022-07-24 ENCOUNTER — Inpatient Hospital Stay: Payer: Self-pay

## 2022-07-24 ENCOUNTER — Other Ambulatory Visit: Payer: Self-pay

## 2022-07-24 ENCOUNTER — Inpatient Hospital Stay: Payer: Self-pay | Attending: Psychiatry

## 2022-07-24 VITALS — BP 128/94 | HR 100 | Resp 18 | Ht 60.0 in | Wt 228.6 lb

## 2022-07-24 DIAGNOSIS — C541 Malignant neoplasm of endometrium: Secondary | ICD-10-CM | POA: Insufficient documentation

## 2022-07-24 DIAGNOSIS — I82C11 Acute embolism and thrombosis of right internal jugular vein: Secondary | ICD-10-CM

## 2022-07-24 DIAGNOSIS — E119 Type 2 diabetes mellitus without complications: Secondary | ICD-10-CM

## 2022-07-24 DIAGNOSIS — Z5112 Encounter for antineoplastic immunotherapy: Secondary | ICD-10-CM | POA: Insufficient documentation

## 2022-07-24 LAB — CBC WITH DIFFERENTIAL (CANCER CENTER ONLY)
Abs Immature Granulocytes: 0.01 10*3/uL (ref 0.00–0.07)
Basophils Absolute: 0 10*3/uL (ref 0.0–0.1)
Basophils Relative: 0 %
Eosinophils Absolute: 0 10*3/uL (ref 0.0–0.5)
Eosinophils Relative: 0 %
HCT: 34.5 % — ABNORMAL LOW (ref 36.0–46.0)
Hemoglobin: 11.1 g/dL — ABNORMAL LOW (ref 12.0–15.0)
Immature Granulocytes: 0 %
Lymphocytes Relative: 46 %
Lymphs Abs: 2.5 10*3/uL (ref 0.7–4.0)
MCH: 28.6 pg (ref 26.0–34.0)
MCHC: 32.2 g/dL (ref 30.0–36.0)
MCV: 88.9 fL (ref 80.0–100.0)
Monocytes Absolute: 0.6 10*3/uL (ref 0.1–1.0)
Monocytes Relative: 11 %
Neutro Abs: 2.4 10*3/uL (ref 1.7–7.7)
Neutrophils Relative %: 43 %
Platelet Count: 153 10*3/uL (ref 150–400)
RBC: 3.88 MIL/uL (ref 3.87–5.11)
RDW: 20.1 % — ABNORMAL HIGH (ref 11.5–15.5)
WBC Count: 5.5 10*3/uL (ref 4.0–10.5)
nRBC: 0 % (ref 0.0–0.2)

## 2022-07-24 LAB — CMP (CANCER CENTER ONLY)
ALT: 48 U/L — ABNORMAL HIGH (ref 0–44)
AST: 40 U/L (ref 15–41)
Albumin: 3.8 g/dL (ref 3.5–5.0)
Alkaline Phosphatase: 52 U/L (ref 38–126)
Anion gap: 7 (ref 5–15)
BUN: 12 mg/dL (ref 6–20)
CO2: 27 mmol/L (ref 22–32)
Calcium: 9.3 mg/dL (ref 8.9–10.3)
Chloride: 104 mmol/L (ref 98–111)
Creatinine: 0.42 mg/dL — ABNORMAL LOW (ref 0.44–1.00)
GFR, Estimated: >60 mL/min (ref >60)
Glucose, Bld: 152 mg/dL — ABNORMAL HIGH (ref 70–99)
Potassium: 3.9 mmol/L (ref 3.5–5.1)
Sodium: 138 mmol/L (ref 135–145)
Total Bilirubin: 0.3 mg/dL (ref 0.3–1.2)
Total Protein: 7.1 g/dL (ref 6.5–8.1)

## 2022-07-24 LAB — TSH: TSH: 3.35 u[IU]/mL (ref 0.350–4.500)

## 2022-07-24 MED ORDER — SODIUM CHLORIDE 0.9 % IV SOLN: Freq: Once | INTRAVENOUS | Status: AC

## 2022-07-24 MED ORDER — SODIUM CHLORIDE 0.9% FLUSH
10.0000 mL | Freq: Once | INTRAVENOUS | Status: AC
  Administered 2022-07-24: 10 mL

## 2022-07-24 MED ORDER — SODIUM CHLORIDE 0.9% FLUSH
10.0000 mL | INTRAVENOUS | Status: DC | PRN
  Administered 2022-07-24: 10 mL

## 2022-07-24 MED ORDER — SODIUM CHLORIDE 0.9 % IV SOLN
1000.0000 mg | Freq: Once | INTRAVENOUS | Status: AC
  Administered 2022-07-24: 1000 mg via INTRAVENOUS
  Filled 2022-07-24: qty 20

## 2022-07-24 MED ORDER — HEPARIN SOD (PORK) LOCK FLUSH 100 UNIT/ML IV SOLN
500.0000 [IU] | Freq: Once | INTRAVENOUS | Status: AC | PRN
  Administered 2022-07-24: 500 [IU]

## 2022-07-24 NOTE — Progress Notes (Signed)
[facility] OFFICE PROGRESS NOTE  Patient Care Team: Donell Beers, FNP as PCP - General (Nurse Practitioner)  ASSESSMENT & PLAN:  Endometrial cancer [facility]) I reviewed CT imaging with her She has no evidence of disease We will continue maintenance immunotherapy for 2 years I plan to repeat imaging in 6 months  Diabetes mellitus type 2, uncomplicated (HCC) She has new onset of diabetes She will continue metformin  I am hopeful that her blood sugar control will improve with discontinuation of chemotherapy   Acute embolism from right internal jugular vein (HCC) She is doing well on anticoagulation therapy She will continue the same until Sept  No orders of the defined types were placed in this encounter.   All questions were answered. The patient knows to call the clinic with any problems, questions or concerns. The total time spent in the appointment was 30 minutes encounter with patients including review of chart and various tests results, discussions about plan of care and coordination of care plan   Artis Delay, MD 07/24/2022 11:45 AM  INTERVAL HISTORY: Please see below for problem oriented charting. she returns for treatment follow-up and review of imaging She denies side-effects from recent chemo  REVIEW OF SYSTEMS:   Constitutional: Denies fevers, chills or abnormal weight loss Eyes: Denies blurriness of vision Ears, nose, mouth, throat, and face: Denies mucositis or sore throat Respiratory: Denies cough, dyspnea or wheezes Cardiovascular: Denies palpitation, chest discomfort or lower extremity swelling Gastrointestinal:  Denies nausea, heartburn or change in bowel habits Skin: Denies abnormal skin rashes Lymphatics: Denies new lymphadenopathy or easy bruising Neurological:Denies numbness, tingling or new weaknesses Behavioral/Psych: Mood is stable, no new changes  All other systems were reviewed with the patient and are negative.  I have reviewed  the past medical history, past surgical history, social history and family history with the patient and they are unchanged from previous note.  ALLERGIES:  is allergic to paclitaxel and advil [ibuprofen].  MEDICATIONS:  Current Outpatient Medications  Medication Sig Dispense Refill   apixaban (ELIQUIS) 5 MG TABS tablet Take 1 tablet (5 mg total) by mouth 2 (two) times daily. 180 tablet 3   atorvastatin (LIPITOR) 10 MG tablet Take 1 tablet (10 mg total) by mouth daily. 90 tablet 0   Blood Glucose Monitoring Suppl DEVI 1 each by Does not apply route in the morning, at noon, and at bedtime. May substitute to any manufacturer covered by patient's insurance. 1 each 0   lidocaine-prilocaine (EMLA) cream Apply to affected area once 30 g 3   metFORMIN (GLUCOPHAGE) 500 MG tablet Take 1 tablet (500 mg total) by mouth 2 (two) times daily with a meal. 60 tablet 3   ondansetron (ZOFRAN) 8 MG tablet Take 1 tablet (8 mg total) by mouth every 8 (eight) hours as needed for nausea or vomiting. Start on the third day after chemotherapy. 30 tablet 1   prochlorperazine (COMPAZINE) 10 MG tablet Take 1 tablet (10 mg total) by mouth every 6 (six) hours as needed for nausea or vomiting. 30 tablet 1   No current facility-administered medications for this visit.   Facility-Administered Medications Ordered in Other Visits  Medication Dose Route Frequency Provider Last Rate Last Admin   sodium chloride flush (NS) 0.9 % injection 10 mL  10 mL Intracatheter PRN Bertis Ruddy, Izzabella Besse, MD   10 mL at 07/24/22 1130    SUMMARY OF ONCOLOGIC HISTORY: Oncology History Overview Note  Endometrioid high grade dMMR abnormal Neg genetics   Endometrial  cancer (HCC)  01/13/2022 Imaging   CT Abdomen/Pelvis: IMPRESSION: Heterogenous cystic mass centered in the cervix concerning for neoplasm. Endometrial thickening possibly due to obstruction from the cystic mass. Direct visualization is recommended.   Enlarged right para-aortic and right  external iliac nodes are indeterminate but concerning for metastasis given cervical lesion.   Borderline enlargement and fecalization of the distal ileum. No definite transition point. Findings suggestive of slow transit/constipation.   01/14/2022 Initial Biopsy   Endometrial biopsy and ECC: A. ENDOCERVIX, CURETTAGE: - BENIGN ENDOCERVICAL MUCOSA WITH ACUTE AND CHRONIC INFLAMMATION - NEGATIVE FOR MALIGNANCY  B. 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  The tumor cells are partially positive for p16. The p53 stain shows foci with wild-type staining and foci with overexpression.  The tumor cells show patchy positivity for ER and PR and are negative for Napsin A.  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  Drs. Arthur Holms reviewed the case and agree with the above diagnosis.  Clinical correlation recommended.    01/14/2022 Initial Diagnosis   Endometrial cancer (HCC)   01/24/2022 Imaging   MR pelvis 1. There is an enhancing mass identified within the lower uterine segment which measures 4.3 x 3.9 by 4.7 cm. This invades the anterior myometrium within the lower uterine segment. This appears to terminate at the level of the internal os of the cervix. Findings are compatible with endometrial carcinoma. 2. Aortocaval, right common iliac, and bilateral pelvic sidewall lymph nodes are enlarged compatible with metastatic adenopathy. Given the presence of periaortic and pelvic node involvement imaging findings are compatible with stage IIIC2 disease.   02/06/2022 PET scan   1. Diffusely increased uptake within the endometrium and cervix compatible with known endometrial carcinoma. 2. Tracer avid retroperitoneal  and pelvic lymph nodes compatible with nodal metastasis. 3. No signs of solid organ metastasis or distant metastatic disease. 4. Nonspecific, diffuse increased bone marrow uptake is noted within the axial and appendicular skeleton. No focal areas of increased uptake to suggest osseous metastasis. 5. Multifocal bilateral areas of fibrosis, architectural distortion and scarring within both lungs which is favored to represent a inflammatory or infectious process. Findings may be the sequelae of prior atypical viral infection.   02/10/2022 Pathology Results   A. RIGHT AORTOCAVAL LYMPH NODE, EXCISION: Two benign lymph nodes, negative for carcinoma (0/2)  B. RIGHT PELVIC LYMPH NODE, EXCISION: Two of two lymph nodes with metastatic adenocarcinoma (2/2)  C. LEFT OBTURATOR PELVIC LYMPH NODE, EXCISION: Three lymph nodes, negative for carcinoma (0/3)  D. 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 RIGHT POSTERIOR CERVICAL MARGIN, EXCISION: Benign cervical stroma Negative for carcinoma  ONCOLOGY TABLE:  UTERUS, CARCINOMA OR CARCINOSARCOMA: Resection  Procedure: Total hysterectomy and bilateral salpingo-oophorectomy with node sampling Histologic Type: Endometrioid adenocarcinoma Histologic Grade: High-grade, FIGO 3  Myometrial Invasion:      Depth of Myometrial Invasion (mm): 10 mm      Myometrial Thickness  (mm): 33 mm      Percentage of Myometrial Invasion: 30% Uterine Serosa Involvement: Not identified Cervical stromal Involvement: Not identified Extent of involvement of other tissue/organs: Not identified Peritoneal/Ascitic Fluid: []  Lymphovascular Invasion: Present Regional Lymph Nodes:      Pelvic Lymph Nodes Examined: 5 nonsentinel lymph nodes      Pelvic Lymph Nodes with Metastasis: 2          Macrometastasis: (>2.0 mm): 2          Micrometastasis: (>0.2 mm and < 2.0 mm): 0          Isolated Tumor Cells (<0.2 mm): 0          Laterality of Lymph Node with Tumor: Right          Extracapsular Extension: Not identified      Para-aortic Lymph Nodes Examined: 2 nonsentinel lymph nodes       Para-aortic Lymph Nodes with Metastasis: 0          Macrometastasis: (>2.0 mm): 0          Micrometastasis: (>0.2 mm and < 2.0 mm): 0          Isolated Tumor Cells (<0.2 mm): 0 Distant Metastasis: Not applicable Pathologic Stage Classification (pTNM, AJCC 8th Edition): pT1a, pN1a  Ancillary Studies: MMR testing has been ordered and the results will be issued within an addendum to this report.    02/10/2022 Surgery   Preoperative Diagnosis: Endometrioid endometrial cancer FIGO grade 2, Morbid obesity (BMI 44)    Procedures: Robotic-assisted total laparoscopic hysterectomy, bilateral salpingo-oophorectomy, bilateral sentinel lymph node injection and debulking of enlarge, PET avid pelvic and para-aortic lymph nodes (Modifer 22: extreme morbid obesity, BMI 44, with significant retroperitoneal and intraperitoneal adiposity requiring additional OR personnel for positioning and retraction. Obesity made retroperitoneal visualization limited, increasing the complexity of the case and necessitating additional instrumentation for retraction and to create safe exposure. Obesity related complexity increased the duration of the procedure by 60 minutes.)   Surgeon: Clide Cliff, MD    Findings: Normal upper  abdominal survey with normal liver surface and diaphragm. Normal appearing small and large bowel. Globally enlarged uterus. Overall normal tubes, and ovaries, small paratubal cyst. No evidence of peritoneal disease, ascites, or carcinomatosis. Enlarged bilateral pelvic and right para-aortic lymph nodes removed. Tumor spill into the vagina and pelvis noted with colpotomy. Pelvis and vagina copiously irrigated. Significant intra-abdominal adiposity.   02/23/2022 Cancer Staging   Staging form: Corpus Uteri - Carcinoma and Carcinosarcoma, AJCC 8th Edition - Pathologic stage from 02/23/2022: Stage IIIC1 (pT1a, pN1, cM0) - Signed by Artis Delay, MD on 02/23/2022 Stage prefix: Initial diagnosis   03/05/2022 Procedure   Status post right IJ port catheter placement.    03/20/2022 -  Chemotherapy   Patient is on Treatment Plan : UTERINE ENDOMETRIAL Dostarlimab-gxly (500 mg) + carboplatin + Taxotere q21d x 6 cycles / Dostarlimab-gxly (1000 mg) q42d x 6 cycles     04/01/2022 Genetic Testing   Negative genetic testing on the CancerNext-Expanded+RNAinsight panel.  MSH3 VUS identified.  The report date is April 01, 2022.  The CancerNext-Expanded gene panel offered by [facility] and includes sequencing and rearrangement analysis for the following 77 genes: AIP, ALK, APC*, ATM*, AXIN2, BAP1, BARD1, BMPR1A, BRCA1*, BRCA2*, BRIP1*, CDC73, CDH1*, CDK4, CDKN1B, CDKN2A, CHEK2*, CTNNA1, DICER1, FH, FLCN, KIF1B, LZTR1, MAX, MEN1, MET, MLH1*,  MSH2*, MSH3, MSH6*, MUTYH*, NF1*, NF2, NTHL1, PALB2*, PHOX2B, PMS2*, POT1, PRKAR1A, PTCH1, PTEN*, RAD51C*, RAD51D*, RB1, RET, SDHA, SDHAF2, SDHB, SDHC, SDHD, SMAD4, SMARCA4, SMARCB1, SMARCE1, STK11, SUFU, TMEM127, TP53*, TSC1, TSC2, and VHL (sequencing and deletion/duplication); EGFR, EGLN1, HOXB13, KIT, MITF, PDGFRA, POLD1, and POLE (sequencing only); EPCAM and GREM1 (deletion/duplication only). DNA and RNA analyses performed for * genes.    04/01/2022 Procedure   Right:  No evidence  of superficial vein thrombosis in the upper extremity.  Findings  consistent with acute deep vein thrombosis involving the right internal jugular vein, right subclavian vein and right brachial veins.    Left:  No evidence of thrombosis in the subclavian.    07/21/2022 Imaging   CT CHEST ABDOMEN PELVIS W CONTRAST  Result Date: 07/20/2022 CLINICAL DATA:  Endometrial cancer. Evaluate response to chemotherapy. * Tracking Code: BO * EXAM: CT CHEST, ABDOMEN, AND PELVIS WITH CONTRAST TECHNIQUE: Multidetector CT imaging of the chest, abdomen and pelvis was performed following the standard protocol during bolus administration of intravenous contrast. RADIATION DOSE REDUCTION: This exam was performed according to the departmental dose-optimization program which includes automated exposure control, adjustment of the mA and/or kV according to patient size and/or use of iterative reconstruction technique. CONTRAST:  OMNIPAQUE IOHEXOL 300 MG/ML  SOLN COMPARISON:  02/06/2022. Chest CT 01/23/2022. Abdominopelvic CT 01/13/2022. FINDINGS: CT CHEST FINDINGS Cardiovascular: Right Port-A-Cath tip high right atrium. Aortic atherosclerosis. Mild cardiomegaly, without pericardial effusion. No central pulmonary embolism, on this non-dedicated study. Mediastinum/Nodes: No supraclavicular adenopathy. Node within the azygoesophageal recess measures 1.1 cm today versus 1.3 cm on the prior, upper normal. No hilar adenopathy. Lungs/Pleura: No pleural fluid. No typical findings of pulmonary metastasis. Primarily similar appearance of right greater than left, upper lobe predominant areas of interstitial thickening, architectural distortion, traction bronchiectasis. Left lower lobe foci of peribronchovascular nodular consolidation are felt to be new or progressive. Example superiorly on 64/4 and more inferiorly and laterally on 84/4. Musculoskeletal: No acute osseous abnormality. CT ABDOMEN PELVIS FINDINGS Hepatobiliary: Nonspecific  caudate lobe enlargement. Suspect mild hepatic steatosis. Hepatomegaly at 19.7 cm craniocauda. L no suspicious liver lesion or biliary duct dilatation after cholecystectomy. Pancreas: Normal, without mass or ductal dilatation. Spleen: Subcentimeter hypoattenuating splenic lesion is similar on the prior and of doubtful clinical significance. Adrenals/Urinary Tract: Normal adrenal glands. Interpolar right renal 1.7 cm lesion measures 24 HU, favoring a minimally complex cyst . In the absence of clinically indicated signs/symptoms require(s) no independent follow-up. No hydronephrosis. Normal urinary bladder. Stomach/Bowel: Normal stomach, without wall thickening. Scattered colonic diverticula. Normal colon and terminal ileum. The appendix crosses the midline and terminates in the central upper pelvis. Normal small bowel. Vascular/Lymphatic: Aortic atherosclerosis. Resolved abdominal adenopathy. Resolved pelvic adenopathy. Residual nodes at the site of right external iliac adenopathy measure 1-2 mm today. Reproductive: Interval hysterectomy.  No adnexal mass. Other: No significant free fluid. No evidence of omental or peritoneal disease. Musculoskeletal: Transitional S1 vertebral body. IMPRESSION: 1. Complete response to therapy of abdominopelvic nodal metastasis. 2. Interval hysterectomy without recurrent pelvic disease. 3. 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 New/progressive areas of peribronchovascular mild ground-glass in the left lower lobe suggest ongoing infection or inflammation. 4. Incidental findings, including: Aortic Atherosclerosis (ICD10-I70.0). Hepatomegaly Electronically Signed   By: Jeronimo Greaves M.D.   On: 07/20/2022 16:58        PHYSICAL EXAMINATION: ECOG PERFORMANCE STATUS: 1 - Symptomatic but completely ambulatory  Vitals:   07/24/22 0940  BP: (!) 128/94  Pulse:  100  Resp: 18  SpO2: 98%   Filed Weights   07/24/22 0940   Weight: 228 lb 9.6 oz (103.7 kg)    GENERAL:alert, no distress and comfortable NEURO: alert & oriented x 3 with fluent speech, no focal motor/sensory deficits  LABORATORY DATA:  I have reviewed the data as listed    Component Value Date/Time   NA 138 07/24/2022 0905   K 3.9 07/24/2022 0905   CL 104 07/24/2022 0905   CO2 27 07/24/2022 0905   GLUCOSE 152 (H) 07/24/2022 0905   BUN 12 07/24/2022 0905   CREATININE 0.42 (L) 07/24/2022 0905   CALCIUM 9.3 07/24/2022 0905   PROT 7.1 07/24/2022 0905   ALBUMIN 3.8 07/24/2022 0905   AST 40 07/24/2022 0905   ALT 48 (H) 07/24/2022 0905   ALKPHOS 52 07/24/2022 0905   BILITOT 0.3 07/24/2022 0905   GFRNONAA >60 07/24/2022 0905   GFRAA >90 12/13/2011 2039    No results found for: "SPEP", "UPEP"  Lab Results  Component Value Date   WBC 5.5 07/24/2022   NEUTROABS 2.4 07/24/2022   HGB 11.1 (L) 07/24/2022   HCT 34.5 (L) 07/24/2022   MCV 88.9 07/24/2022   PLT 153 07/24/2022      Chemistry      Component Value Date/Time   NA 138 07/24/2022 0905   K 3.9 07/24/2022 0905   CL 104 07/24/2022 0905   CO2 27 07/24/2022 0905   BUN 12 07/24/2022 0905   CREATININE 0.42 (L) 07/24/2022 0905      Component Value Date/Time   CALCIUM 9.3 07/24/2022 0905   ALKPHOS 52 07/24/2022 0905   AST 40 07/24/2022 0905   ALT 48 (H) 07/24/2022 0905   BILITOT 0.3 07/24/2022 0905       RADIOGRAPHIC STUDIES:I reviewed imaging with patient I have personally reviewed the radiological images as listed and agreed with the findings in the report. CT CHEST ABDOMEN PELVIS W CONTRAST  Result Date: 07/20/2022 CLINICAL DATA:  Endometrial cancer. Evaluate response to chemotherapy. * Tracking Code: BO * EXAM: CT CHEST, ABDOMEN, AND PELVIS WITH CONTRAST TECHNIQUE: Multidetector CT imaging of the chest, abdomen and pelvis was performed following the standard protocol during bolus administration of intravenous contrast. RADIATION DOSE REDUCTION: This exam was performed  according to the departmental dose-optimization program which includes automated exposure control, adjustment of the mA and/or kV according to patient size and/or use of iterative reconstruction technique. CONTRAST:  OMNIPAQUE IOHEXOL 300 MG/ML  SOLN COMPARISON:  02/06/2022. Chest CT 01/23/2022. Abdominopelvic CT 01/13/2022. FINDINGS: CT CHEST FINDINGS Cardiovascular: Right Port-A-Cath tip high right atrium. Aortic atherosclerosis. Mild cardiomegaly, without pericardial effusion. No central pulmonary embolism, on this non-dedicated study. Mediastinum/Nodes: No supraclavicular adenopathy. Node within the azygoesophageal recess measures 1.1 cm today versus 1.3 cm on the prior, upper normal. No hilar adenopathy. Lungs/Pleura: No pleural fluid. No typical findings of pulmonary metastasis. Primarily similar appearance of right greater than left, upper lobe predominant areas of interstitial thickening, architectural distortion, traction bronchiectasis. Left lower lobe foci of peribronchovascular nodular consolidation are felt to be new or progressive. Example superiorly on 64/4 and more inferiorly and laterally on 84/4. Musculoskeletal: No acute osseous abnormality. CT ABDOMEN PELVIS FINDINGS Hepatobiliary: Nonspecific caudate lobe enlargement. Suspect mild hepatic steatosis. Hepatomegaly at 19.7 cm craniocauda. L no suspicious liver lesion or biliary duct dilatation after cholecystectomy. Pancreas: Normal, without mass or ductal dilatation. Spleen: Subcentimeter hypoattenuating splenic lesion is similar on the prior and of doubtful clinical significance. Adrenals/Urinary Tract: Normal adrenal  glands. Interpolar right renal 1.7 cm lesion measures 24 HU, favoring a minimally complex cyst . In the absence of clinically indicated signs/symptoms require(s) no independent follow-up. No hydronephrosis. Normal urinary bladder. Stomach/Bowel: Normal stomach, without wall thickening. Scattered colonic diverticula. Normal  colon and terminal ileum. The appendix crosses the midline and terminates in the central upper pelvis. Normal small bowel. Vascular/Lymphatic: Aortic atherosclerosis. Resolved abdominal adenopathy. Resolved pelvic adenopathy. Residual nodes at the site of right external iliac adenopathy measure 1-2 mm today. Reproductive: Interval hysterectomy.  No adnexal mass. Other: No significant free fluid. No evidence of omental or peritoneal disease. Musculoskeletal: Transitional S1 vertebral body. IMPRESSION: 1. Complete response to therapy of abdominopelvic nodal metastasis. 2. Interval hysterectomy without recurrent pelvic disease. 3. 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 New/progressive areas of peribronchovascular mild ground-glass in the left lower lobe suggest ongoing infection or inflammation. 4. Incidental findings, including: Aortic Atherosclerosis (ICD10-I70.0). Hepatomegaly Electronically Signed   By: Jeronimo Greaves M.D.   On: 07/20/2022 16:58

## 2022-07-24 NOTE — Assessment & Plan Note (Signed)
She has new onset of diabetes She will continue metformin  I am hopeful that her blood sugar control will improve with discontinuation of chemotherapy

## 2022-07-24 NOTE — Assessment & Plan Note (Signed)
She is doing well on anticoagulation therapy She will continue the same until Sept

## 2022-07-24 NOTE — Progress Notes (Signed)
Patient is now finished with chemotherapy. Referral placed to Radiation Oncology.

## 2022-07-24 NOTE — Patient Instructions (Signed)
[facility]  Discharge Instructions: Thank you for choosing [facility] to provide your oncology and hematology care.   If you have a lab appointment with the Cancer Center, please go directly to the Cancer Center and check in at the registration area.   Wear comfortable clothing and clothing appropriate for easy access to any Portacath or PICC line.   We strive to give you quality time with your provider. You may need to reschedule your appointment if you arrive late (15 or more minutes).  Arriving late affects you and other patients whose appointments are after yours.  Also, if you miss three or more appointments without notifying the office, you may be dismissed from the clinic at the provider's discretion.      For prescription refill requests, have your pharmacy contact our office and allow 72 hours for refills to be completed.    Today you received the following chemotherapy and/or immunotherapy agents Jemperli To help prevent nausea and vomiting after your treatment, we encourage you to take your nausea medication as directed.  BELOW ARE SYMPTOMS THAT SHOULD BE REPORTED IMMEDIATELY: *FEVER GREATER THAN 100.4 F (38 C) OR HIGHER *CHILLS OR SWEATING *NAUSEA AND VOMITING THAT IS NOT CONTROLLED WITH YOUR NAUSEA MEDICATION *UNUSUAL SHORTNESS OF BREATH *UNUSUAL BRUISING OR BLEEDING *URINARY PROBLEMS (pain or burning when urinating, or frequent urination) *BOWEL PROBLEMS (unusual diarrhea, constipation, pain near the anus) TENDERNESS IN MOUTH AND THROAT WITH OR WITHOUT PRESENCE OF ULCERS (sore throat, sores in mouth, or a toothache) UNUSUAL RASH, SWELLING OR PAIN  UNUSUAL VAGINAL DISCHARGE OR ITCHING   Items with * indicate a potential emergency and should be followed up as soon as possible or go to the Emergency Department if any problems should occur.  Please show the CHEMOTHERAPY ALERT CARD or IMMUNOTHERAPY ALERT CARD at check-in to  the Emergency Department and triage nurse.  Should you have questions after your visit or need to cancel or reschedule your appointment, please contact [facility]  Dept: [phone]  and follow the prompts.  Office hours are 8:00 a.m. to 4:30 p.m. Monday - Friday. Please note that voicemails left after 4:00 p.m. may not be returned until the following business day.  We are closed weekends and major holidays. You have access to a nurse at all times for urgent questions. Please call the main number to the clinic Dept: [phone] and follow the prompts.   For any non-urgent questions, you may also contact your provider using MyChart. We now offer e-Visits for anyone 18 and older to request care online for non-urgent symptoms. For details visit [email or web address].   Also download the MyChart app! Go to the app store, search "MyChart", open the app, select [facility], and log in with your MyChart username and password.  

## 2022-07-24 NOTE — Assessment & Plan Note (Signed)
I reviewed CT imaging with her She has no evidence of disease We will continue maintenance immunotherapy for 2 years I plan to repeat imaging in 6 months

## 2022-07-24 NOTE — Telephone Encounter (Signed)
Mammography scholarship application was approved, appointment schedule, 0/01/2022 @ 2:30 pm [facility] ([address]). Patient informed.

## 2022-07-25 LAB — T4: T4, Total: 9 ug/dL (ref 4.5–12.0)

## 2022-07-28 ENCOUNTER — Encounter: Payer: Self-pay | Admitting: Radiation Oncology

## 2022-07-28 ENCOUNTER — Telehealth: Payer: Self-pay

## 2022-07-28 NOTE — Progress Notes (Signed)
Radiation Oncology         [phone] ________________________________  Initial Outpatient Consultation  Name: Maureen Campos MRN: [id number]  Date: 07/29/2022  DOB: [birth year]  [id number], Baird Kay, FNP  Artis Delay, MD   REFERRING PHYSICIAN: Artis Delay, MD  DIAGNOSIS: The encounter diagnosis was Endometrial cancer [facility]).  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 female who is accompanied by ***. she is seen as a courtesy of Dr. Bertis Ruddy for an opinion concerning radiation therapy as part of management for her recently diagnosed endometrial cancer.   The patient initially presented the ED on 01/12/22 with 8-9 months of abdominal pain and intermittent vaginal bleeding which she attributed to menopause. CT AP performed at that time revealed a heterogenous cystic mass centered in the cervix concerning for neoplasm. Imaging also showed endometrial thickening possible due to obstruction from the cystic mass, and indeterminate enlarged right para-aortic and right external iliac nodes concerning for nodal metastatic disease. (Of note: Wet prep in the ED was positive for trichomonas and she was given a prescription for doxycycline and a dose of metronidazole and ceftriaxone)   She was subsequently referred to Dr. Alvester Morin on 01/14/22 for further evaluation and management. GU exam performed during this visit noted a normal ectocervix but fullness superiorly within endocervical canal and lower uterine  segment. No other abnormalities were noted on examination.   Endometrial biopsy collected by Dr. Alvester Morin on 01/14/22 showed findings consistent with FIGO grade 2 endometrial endometrioid carcinoma with clear cell changes; MMR abnormal; MLH1 assay present. (Endocervical curettings showed no evidence of malignancy).  To rule out any metastatic disease, further staging work-up was performed including:  -- Chest CT on 01/23/22 which demonstrated no definite evidence of pulmonary metastatic disease. Other findings included coarse infiltrates in the right lung and focal infiltrates in the left lingula likely representing chronic fibrosis and chronic bronchitic changes, and prominent but nonspecific mediastinal lymph nodes measuring up to 1.3 cm in diameter.  -- MRI of the pelvis with and without contrast on 01/24/22 demonstrated: an enhancing mass identified within the lower uterine segment measuring 4.3 x 3.9 by 4.7 cm, invading the anterior myometrium within the lower uterine segment, and appearing to terminate at the level of the internal os of the cervix. Enlarged aortocaval, right common iliac, and bilateral pelvic sidewall lymph nodes were also appreciated, compatible with metastatic adenopathy.  -- To better evaluate MRI/CT findings, a PET scan was performed on 02/06/22 which showed: diffusely increased uptake within the endometrium and cervix compatible with known endometrial carcinoma, and tracer avid retroperitoneal and pelvic lymph nodes compatible with nodal metastasis. PET otherwise showed no signs of solid organ metastasis or distant metastatic disease. (Nonspecific diffuse increased bone marrow uptake was also noted within the axial and appendicular skeleton which were without focal areas of increased uptake and thus were non-suggestive of osseous metastatic disease).   Based on imaging showing no evidence of distant metastatic disease, the patient was able to proceed with hysterectomy, BSO, and  nodal excisions on 02/10/22 under the care of Dr. Alvester Morin.  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  The patient was accordingly referred to Dr. Bertis Ruddy and she opted to proceed with adjuvant chemotherapy consisting of Jemperli, Taxol, Carboplatin on 03/20/22. Following cycle 1, the patient developed dysuria, urinary frequency, and decrease in appetite. She also developed neck pain and was found to have acute DVT involving the right internal  jugular vein, right subclavian vein and right brachial veins. She was subsequently started on Eliquis. S/p cycle 2, the patient developed an allergic reaction to taxol which was subsequently substituted for taxotere. She also encountered issues secondary to uncontrolled diabetes with progressive weight gain noted during treatment. Her calculated dose of chemotherapy subsequently came back quite high due to class III obesity, though Dr. Bertis Ruddy kept her max dose of carboplatin at 750 mg. For management, she received single dose insulin which was given prior to each infusion.   She completed her final cycle of chemotherapy on 07/03/22. Restaging CT CAP on 07/20/22 demonstrated no evidence of recurrent pelvic disease s/p hysterectomy with a complete response to therapy in the abdominopelvic nodal metastasis.   She recently began maintenance immunotherapy with Jemperli on 07/24/22. She will continue with maintenance treatment for 2 years.   Of note: Genetic testing for lynch syndrome  was performed on 02/28 and showed no clinically significant variants detected by Lynch Syndrome Analyses with CancerNext-Expanded +RNAinsight testing. However, a variant of unknown significance was detected in the MSH3 gene.   PREVIOUS RADIATION THERAPY: No  PAST MEDICAL HISTORY:  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)    Diabetes mellitus without complication (HCC)    Dyspnea    with activity   Endometrial cancer (HCC)    Family history of colon cancer 03/04/2022   Family history of uterine cancer    Fast heart beat    with activity   Fatigue    Hearing loss    Left ear   History of kidney stones    12 years ago   Lazy eye, left    Left leg numbness    occ   Migraine    occ    PAST SURGICAL HISTORY: Past Surgical History:  Procedure Laterality Date   CHOLECYSTECTOMY  2011   IR CHEST FLUORO  04/07/2022   IR CV LINE INJECTION  04/09/2022   IR IMAGING GUIDED PORT INSERTION  03/05/2022   IR PORT REPAIR CENTRAL VENOUS ACCESS DEVICE  04/09/2022   IR US GUIDE VASC ACCESS RIGHT  04/09/2022   ROBOTIC ASSISTED TOTAL HYSTERECTOMY WITH BILATERAL SALPINGO OOPHERECTOMY N/A 02/10/2022   Procedure: XI ROBOTIC ASSISTED TOTAL HYSTERECTOMY WITH BILATERAL SALPINGO OOPHORECTOMY;  Surgeon: Clide Cliff, MD;  Location: [facility];  Service: Gynecology;  Laterality: N/A;   ROBOTIC PELVIC AND PARA-AORTIC LYMPH NODE DISSECTION N/A 02/10/2022   Procedure: XI ROBOTIC PELVIC AND PARA-AORTIC SENTINEL LYMPH NODE DISSECTION;  Surgeon: Clide Cliff, MD;  Location: [facility];  Service: Gynecology;  Laterality: N/A;    FAMILY HISTORY:  Family History  Problem Relation Age of Onset   Colon cancer Father 19  Uterine cancer Sister 80   Melanoma Sister 38   Cancer Paternal Aunt        possible brain cancer, dx > 50   Breast cancer Neg Hx    Ovarian cancer Neg Hx    Endometrial cancer Neg Hx    Pancreatic cancer Neg Hx    Prostate cancer Neg Hx      SOCIAL HISTORY:  Social History   Tobacco Use   Smoking status: Never   Smokeless tobacco: Never  Vaping Use   Vaping status: Never Used  Substance Use Topics   Alcohol use: Not Currently    Comment: every weekend   Drug use: No    ALLERGIES:  Allergies  Allergen Reactions   Paclitaxel Shortness Of Breath and Other (See Comments)    See progress note from 04/10/22   Advil [Ibuprofen] Itching and Rash    MEDICATIONS:  Current Outpatient Medications  Medication Sig Dispense Refill   apixaban (ELIQUIS) 5 MG TABS tablet Take 1 tablet (5 mg total) by mouth 2 (two) times daily. 180 tablet 3   atorvastatin (LIPITOR) 10 MG tablet Take 1 tablet (10 mg total) by mouth daily. 90 tablet 0   Blood Glucose Monitoring Suppl DEVI 1 each by Does not apply route in the morning, at noon, and at bedtime. May substitute to any manufacturer covered by patient's insurance. 1 each 0   lidocaine-prilocaine (EMLA) cream Apply to affected area once 30 g 3   metFORMIN (GLUCOPHAGE) 500 MG tablet Take 1 tablet (500 mg total) by mouth 2 (two) times daily with a meal. 60 tablet 3   ondansetron (ZOFRAN) 8 MG tablet Take 1 tablet (8 mg total) by mouth every 8 (eight) hours as needed for nausea or vomiting. Start on the third day after chemotherapy. 30 tablet 1   prochlorperazine (COMPAZINE) 10 MG tablet Take 1 tablet (10 mg total) by mouth every 6 (six) hours as needed for nausea or vomiting. 30 tablet 1   No current facility-administered medications for this encounter.    REVIEW OF SYSTEMS:  A 10+ POINT REVIEW OF SYSTEMS WAS OBTAINED including neurology, dermatology, psychiatry, cardiac, respiratory, lymph, extremities, GI, GU, musculoskeletal, constitutional, reproductive, HEENT. ***   PHYSICAL EXAM:  vitals were not taken for this visit.   General: Alert and oriented, in no acute distress HEENT: Head is normocephalic. Extraocular movements are intact. Oropharynx is clear. Neck: Neck is supple, no  palpable cervical or supraclavicular lymphadenopathy. Heart: Regular in rate and rhythm with no murmurs, rubs, or gallops. Chest: Clear to auscultation bilaterally, with no rhonchi, wheezes, or rales. Abdomen: Soft, nontender, nondistended, with no rigidity or guarding. Extremities: No cyanosis or edema. Lymphatics: see Neck Exam Skin: No concerning lesions. Musculoskeletal: symmetric strength and muscle tone throughout. Neurologic: Cranial nerves II through XII are grossly intact. No obvious focalities. Speech is fluent. Coordination is intact. Psychiatric: Judgment and insight are intact. Affect is appropriate.  On pelvic examination the external genitalia were unremarkable. A speculum exam was performed. There are no mucosal lesions noted in the vaginal vault. A Pap smear was obtained of the proximal vagina. On bimanual and rectovaginal examination there were no pelvic masses appreciated. ***   ECOG = ***  0 - Asymptomatic (Fully active, able to carry on all predisease activities without restriction)  1 - Symptomatic but completely ambulatory (Restricted in physically strenuous activity but ambulatory and able to carry out work of a light or sedentary nature. For example, light housework, office work)  2 -  Symptomatic, <50% in bed during the day (Ambulatory and capable of all self care but unable to carry out any work activities. Up and about more than 50% of waking hours)  3 - Symptomatic, >50% in bed, but not bedbound (Capable of only limited self-care, confined to bed or chair 50% or more of waking hours)  4 - Bedbound (Completely disabled. Cannot carry on any self-care. Totally confined to bed or chair)  5 - Death   Santiago Glad MM, Creech RH, Tormey DC, et al. [id number]). "Toxicity and response criteria of the [facility]". Am. Evlyn Clines. Oncol. 5 (6): 649-55  LABORATORY DATA:  Lab Results  Component Value Date   WBC 5.5 07/24/2022   HGB 11.1 (L) 07/24/2022   HCT  34.5 (L) 07/24/2022   MCV 88.9 07/24/2022   PLT 153 07/24/2022   NEUTROABS 2.4 07/24/2022   Lab Results  Component Value Date   NA 138 07/24/2022   K 3.9 07/24/2022   CL 104 07/24/2022   CO2 27 07/24/2022   GLUCOSE 152 (H) 07/24/2022   BUN 12 07/24/2022   CREATININE 0.42 (L) 07/24/2022   CALCIUM 9.3 07/24/2022      RADIOGRAPHY: CT CHEST ABDOMEN PELVIS W CONTRAST  Result Date: 07/20/2022 CLINICAL DATA:  Endometrial cancer. Evaluate response to chemotherapy. * Tracking Code: BO * EXAM: CT CHEST, ABDOMEN, AND PELVIS WITH CONTRAST TECHNIQUE: Multidetector CT imaging of the chest, abdomen and pelvis was performed following the standard protocol during bolus administration of intravenous contrast. RADIATION DOSE REDUCTION: This exam was performed according to the departmental dose-optimization program which includes automated exposure control, adjustment of the mA and/or kV according to patient size and/or use of iterative reconstruction technique. CONTRAST:  OMNIPAQUE IOHEXOL 300 MG/ML  SOLN COMPARISON:  02/06/2022. Chest CT 01/23/2022. Abdominopelvic CT 01/13/2022. FINDINGS: CT CHEST FINDINGS Cardiovascular: Right Port-A-Cath tip high right atrium. Aortic atherosclerosis. Mild cardiomegaly, without pericardial effusion. No central pulmonary embolism, on this non-dedicated study. Mediastinum/Nodes: No supraclavicular adenopathy. Node within the azygoesophageal recess measures 1.1 cm today versus 1.3 cm on the prior, upper normal. No hilar adenopathy. Lungs/Pleura: No pleural fluid. No typical findings of pulmonary metastasis. Primarily similar appearance of right greater than left, upper lobe predominant areas of interstitial thickening, architectural distortion, traction bronchiectasis. Left lower lobe foci of peribronchovascular nodular consolidation are felt to be new or progressive. Example superiorly on 64/4 and more inferiorly and laterally on 84/4. Musculoskeletal: No acute osseous  abnormality. CT ABDOMEN PELVIS FINDINGS Hepatobiliary: Nonspecific caudate lobe enlargement. Suspect mild hepatic steatosis. Hepatomegaly at 19.7 cm craniocauda. L no suspicious liver lesion or biliary duct dilatation after cholecystectomy. Pancreas: Normal, without mass or ductal dilatation. Spleen: Subcentimeter hypoattenuating splenic lesion is similar on the prior and of doubtful clinical significance. Adrenals/Urinary Tract: Normal adrenal glands. Interpolar right renal 1.7 cm lesion measures 24 HU, favoring a minimally complex cyst . In the absence of clinically indicated signs/symptoms require(s) no independent follow-up. No hydronephrosis. Normal urinary bladder. Stomach/Bowel: Normal stomach, without wall thickening. Scattered colonic diverticula. Normal colon and terminal ileum. The appendix crosses the midline and terminates in the central upper pelvis. Normal small bowel. Vascular/Lymphatic: Aortic atherosclerosis. Resolved abdominal adenopathy. Resolved pelvic adenopathy. Residual nodes at the site of right external iliac adenopathy measure 1-2 mm today. Reproductive: Interval hysterectomy.  No adnexal mass. Other: No significant free fluid. No evidence of omental or peritoneal disease. Musculoskeletal: Transitional S1 vertebral body. IMPRESSION: 1. Complete response to therapy of abdominopelvic nodal metastasis. 2. Interval hysterectomy without recurrent  pelvic disease. 3. 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 New/progressive areas of peribronchovascular mild ground-glass in the left lower lobe suggest ongoing infection or inflammation. 4. Incidental findings, including: Aortic Atherosclerosis (ICD10-I70.0). Hepatomegaly Electronically Signed   By: Jeronimo Greaves M.D.   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  We discussed the natural history of *** and general treatment, highlighting the role of radiotherapy in the management.  We discussed the available radiation techniques, and focused on the details of logistics and delivery.  We reviewed the anticipated acute and late sequelae associated with radiation in this setting.  The patient was encouraged to ask questions that I answered to the best of my ability. *** A patient consent form was discussed and signed.  We retained a copy for our records.  The patient would like to proceed with radiation and will be scheduled for CT simulation.  PLAN: ***    *** minutes of total time was spent for this patient encounter, including preparation, face-to-face counseling with the patient and coordination of care, physical exam, and documentation of the encounter.   ------------------------------------------------  Billie Lade, PhD, MD  This document serves as a record of services personally performed by Antony Blackbird, MD. It was created on his behalf by Neena Rhymes, a trained medical scribe. The creation of this record is based on the scribe's personal observations and the provider's statements to them. This document has been checked and approved by the attending provider.

## 2022-07-28 NOTE — Telephone Encounter (Signed)
Rn called pt for meaningful use and nurse evaluation information. Note completed and routed to Dr. Roselind Messier.

## 2022-07-28 NOTE — Progress Notes (Signed)
GYN Location of Tumor / Histology: Endometrial  Maureen Campos presented with symptoms of: pelvic cramping  Biopsies revealed:  02-10-22  FINAL MICROSCOPIC DIAGNOSIS:  A. RIGHT AORTOCAVAL LYMPH NODE, EXCISION: Two benign lymph nodes, negative for carcinoma (0/2)  B. RIGHT PELVIC LYMPH NODE, EXCISION: Two of two lymph nodes with metastatic adenocarcinoma (2/2)  C. LEFT OBTURATOR PELVIC LYMPH NODE, EXCISION: Three lymph nodes, negative for carcinoma (0/3)  D. 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 RIGHT POSTERIOR CERVICAL MARGIN, EXCISION: Benign cervical stroma Negative for carcinoma  ONCOLOGY TABLE:  UTERUS, CARCINOMA OR CARCINOSARCOMA: Resection  Procedure: Total hysterectomy and bilateral salpingo-oophorectomy with node sampling Histologic Type: Endometrioid adenocarcinoma Histologic Grade: High-grade, FIGO 3 Myometrial Invasion:      Depth of Myometrial Invasion (mm): 10 mm      Myometrial Thickness (mm): 33 mm      Percentage of Myometrial Invasion: 30% Uterine Serosa Involvement: Not identified Cervical stromal Involvement: Not identified Extent of involvement of other tissue/organs: Not identified Peritoneal/Ascitic Fluid: []  Lymphovascular Invasion: Present Regional Lymph Nodes:      Pelvic Lymph Nodes Examined: 5 nonsentinel lymph nodes      Pelvic Lymph Nodes with  Metastasis: 2          Macrometastasis: (>2.0 mm): 2          Micrometastasis: (>0.2 mm and < 2.0 mm): 0          Isolated Tumor Cells (<0.2 mm): 0          Laterality of Lymph Node with Tumor: Right          Extracapsular Extension: Not identified      Para-aortic Lymph Nodes Examined: 2 nonsentinel lymph nodes       Para-aortic Lymph Nodes with Metastasis: 0          Macrometastasis: (>2.0 mm): 0          Micrometastasis: (>0.2 mm and < 2.0 mm): 0          Isolated Tumor Cells (<0.2 mm): 0 Distant Metastasis: Not applicable Pathologic Stage Classification (pTNM, AJCC 8th Edition): pT1a, pN1a Ancillary Studies: MMR testing has been ordered and the results will be issued within an addendum to this report. Representative Tumor Block: D3 Comment(s): An immunohistochemical stain for pancytokeratin is performed with adequate control on each of the negative lymph nodes and are negative for occult epithelial cells.  An immunohistochemical stain for p53 is performed with adequate control on the endometrioid adenocarcinoma and shows variable weak to focally strong staining within the tumor consistent with a wild-type staining pattern. (v4.2.0.1)   Past/Anticipated interventions by Gyn/Onc surgery, if any:  Date of Service: 02/10/2022   Preoperative Diagnosis: Endometrioid endometrial cancer FIGO grade 2, Morbid obesity (BMI 44)   Postoperative Diagnosis: Same   Procedures: Robotic-assisted total laparoscopic hysterectomy, bilateral salpingo-oophorectomy, bilateral sentinel lymph node injection and debulking of enlarge, PET avid pelvic and para-aortic lymph nodes (Modifer 22: extreme morbid obesity, BMI 44, with significant retroperitoneal and intraperitoneal adiposity requiring additional OR personnel  for positioning and retraction. Obesity made retroperitoneal visualization limited, increasing the complexity of the case and necessitating additional instrumentation for retraction and to create  safe exposure. Obesity related complexity increased the duration of the procedure by 60 minutes.)   Surgeon: Clide Cliff, MD  Past/Anticipated interventions by medical oncology, if any:  Artis Delay, MD 07/03/2022  Oncology History Overview Note   Endometrioid high grade dMMR abnormal Neg genetics    Endometrial cancer (HCC)   01/13/2022 Imaging     CT Abdomen/Pelvis: IMPRESSION: Heterogenous cystic mass centered in the cervix concerning for neoplasm. Endometrial thickening possibly due to obstruction from the cystic mass. Direct visualization is recommended.   Enlarged right para-aortic and right external iliac nodes are indeterminate but concerning for metastasis given cervical lesion.   Borderline enlargement and fecalization of the distal ileum. No definite transition point. Findings suggestive of slow transit/constipation.     01/14/2022 Initial Biopsy     Endometrial biopsy and ECC: A. ENDOCERVIX, CURETTAGE: - BENIGN ENDOCERVICAL MUCOSA WITH ACUTE AND CHRONIC INFLAMMATION - NEGATIVE FOR MALIGNANCY  B. 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  The tumor cells are partially positive for p16. The p53 stain shows foci with wild-type staining and foci with overexpression.  The tumor cells show patchy positivity for ER and PR and are negative for Napsin A.  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  Drs. Arthur Holms reviewed the case and agree with the above diagnosis.  Clinical correlation recommended.      01/14/2022 Initial Diagnosis     Endometrial cancer (HCC)     01/24/2022 Imaging     MR pelvis 1. There is an enhancing mass identified within the lower uterine segment which  measures 4.3 x 3.9 by 4.7 cm. This invades the anterior myometrium within the lower uterine segment. This appears to terminate at the level of the internal os of the cervix. Findings are compatible with endometrial carcinoma. 2. Aortocaval, right common iliac, and bilateral pelvic sidewall lymph nodes are enlarged compatible with metastatic adenopathy. Given the presence of periaortic and pelvic node involvement imaging findings are compatible with stage IIIC2 disease.     02/06/2022 PET scan     1. Diffusely increased uptake within the endometrium and cervix compatible with known endometrial carcinoma. 2. Tracer avid retroperitoneal and pelvic lymph nodes compatible with nodal metastasis. 3. No signs of solid organ metastasis or distant metastatic disease. 4. Nonspecific, diffuse increased bone marrow uptake is noted within the axial and appendicular skeleton. No focal areas of increased uptake to suggest osseous metastasis. 5. Multifocal bilateral areas of fibrosis, architectural distortion and scarring within both lungs which is favored to represent a inflammatory or infectious process. Findings may be the sequelae of prior atypical viral infection.     02/10/2022 Pathology Results     A. RIGHT AORTOCAVAL LYMPH NODE, EXCISION: Two benign lymph nodes, negative for carcinoma (0/2)  B. RIGHT PELVIC LYMPH NODE, EXCISION: Two of two lymph nodes with metastatic adenocarcinoma (2/2)  C. LEFT OBTURATOR PELVIC LYMPH NODE, EXCISION: Three lymph nodes, negative for carcinoma (0/3)  D. 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 RIGHT POSTERIOR CERVICAL MARGIN, EXCISION: Benign cervical stroma Negative for carcinoma  ONCOLOGY TABLE:  UTERUS, CARCINOMA OR CARCINOSARCOMA: Resection  Procedure: Total hysterectomy and bilateral salpingo-oophorectomy with node sampling Histologic Type: Endometrioid adenocarcinoma Histologic Grade: High-grade, FIGO 3 Myometrial Invasion:      Depth of Myometrial Invasion (mm): 10 mm      Myometrial Thickness (mm): 33 mm      Percentage of Myometrial Invasion: 30% Uterine Serosa Involvement: Not identified Cervical stromal Involvement: Not identified Extent of involvement of other tissue/organs: Not identified Peritoneal/Ascitic Fluid: []  Lymphovascular Invasion: Present Regional Lymph Nodes:      Pelvic Lymph Nodes Examined: 5 nonsentinel lymph nodes      Pelvic Lymph Nodes with Metastasis: 2          Macrometastasis: (>2.0 mm): 2          Micrometastasis: (>0.2 mm and < 2.0 mm): 0          Isolated Tumor Cells (<0.2 mm): 0          Laterality of Lymph Node with Tumor: Right          Extracapsular Extension: Not identified      Para-aortic Lymph Nodes Examined: 2 nonsentinel lymph nodes       Para-aortic Lymph Nodes with Metastasis: 0          Macrometastasis: (>2.0 mm): 0          Micrometastasis: (>0.2 mm and < 2.0 mm): 0          Isolated Tumor Cells (<0.2 mm): 0 Distant Metastasis: Not applicable Pathologic Stage Classification (pTNM, AJCC 8th Edition): pT1a, pN1a  Ancillary Studies: MMR testing has been ordered and the results will be issued within an addendum to this report.       02/10/2022 Surgery     Preoperative Diagnosis: Endometrioid endometrial cancer FIGO grade 2, Morbid obesity (BMI 44)    Procedures: Robotic-assisted total  laparoscopic hysterectomy, bilateral salpingo-oophorectomy, bilateral sentinel lymph node injection and debulking of enlarge, PET avid pelvic and para-aortic lymph nodes (Modifer 22: extreme morbid obesity, BMI 44, with significant retroperitoneal and intraperitoneal adiposity requiring additional OR personnel for positioning and retraction. Obesity made retroperitoneal visualization limited, increasing the complexity of the case and necessitating additional instrumentation for retraction and to create safe exposure. Obesity related complexity increased the duration of the procedure by 60 minutes.)   Surgeon: Clide Cliff, MD    Findings: Normal upper abdominal survey with normal liver surface and diaphragm. Normal appearing small and large bowel. Globally enlarged uterus. Overall normal tubes, and ovaries, small paratubal cyst. No evidence of peritoneal disease, ascites, or carcinomatosis. Enlarged bilateral pelvic and right para-aortic lymph nodes removed. Tumor spill into the vagina and pelvis noted with colpotomy. Pelvis and vagina copiously irrigated. Significant intra-abdominal adiposity.     02/23/2022 Cancer Staging     Staging form: Corpus Uteri - Carcinoma and Carcinosarcoma, AJCC 8th Edition - Pathologic stage from 02/23/2022: Stage IIIC1 (pT1a, pN1, cM0) - Signed by Artis Delay, MD on 02/23/2022 Stage prefix: Initial diagnosis     03/05/2022 Procedure     Status post right IJ port catheter placement.  03/20/2022 -  Chemotherapy     Patient is on Treatment Plan : UTERINE ENDOMETRIAL Dostarlimab-gxly (500 mg) + carboplatin + Taxotere q21d x 6 cycles / Dostarlimab-gxly (1000 mg) q42d x 6 cycles      04/01/2022 Genetic Testing     Negative genetic testing on the CancerNext-Expanded+RNAinsight panel.  MSH3 VUS identified.  The report date is April 01, 2022.   The CancerNext-Expanded gene panel offered by [facility] and includes sequencing and rearrangement analysis for the following  77 genes: AIP, ALK, APC*, ATM*, AXIN2, BAP1, BARD1, BMPR1A, BRCA1*, BRCA2*, BRIP1*, CDC73, CDH1*, CDK4, CDKN1B, CDKN2A, CHEK2*, CTNNA1, DICER1, FH, FLCN, KIF1B, LZTR1, MAX, MEN1, MET, MLH1*, MSH2*, MSH3, MSH6*, MUTYH*, NF1*, NF2, NTHL1, PALB2*, PHOX2B, PMS2*, POT1, PRKAR1A, PTCH1, PTEN*, RAD51C*, RAD51D*, RB1, RET, SDHA, SDHAF2, SDHB, SDHC, SDHD, SMAD4, SMARCA4, SMARCB1, SMARCE1, STK11, SUFU, TMEM127, TP53*, TSC1, TSC2, and VHL (sequencing and deletion/duplication); EGFR, EGLN1, HOXB13, KIT, MITF, PDGFRA, POLD1, and POLE (sequencing only); EPCAM and GREM1 (deletion/duplication only). DNA and RNA analyses performed for * genes.      04/01/2022 Procedure     Right:  No evidence of superficial vein thrombosis in the upper extremity.  Findings  consistent with acute deep vein thrombosis involving the right internal jugular vein, right subclavian vein and right brachial veins.    Left:  No evidence of thrombosis in the subclavian.      ASSESSMENT & PLAN:  Endometrial cancer (HCC) She tolerated last cycle of therapy well without signs of neuropathy However, the patient now has uncontrolled diabetes with progressive weight gain while on chemotherapy We discussed the importance of dietary modification and weight loss She will get 1 dose of insulin today Plan to repeat imaging study next month before we switch her over to immunotherapy maintenance   Diabetes mellitus type 2, uncomplicated (HCC) She has new onset of diabetes She will continue metformin  I am hopeful that her blood sugar control will improve with discontinuation of chemotherapy after today   Obesity, Class III, BMI 40-49.9 (morbid obesity) (HCC) The calculated dose of chemotherapy comes back quite high due to class III obesity We discussed importance of dietary modification during treatment I will keep maximum dose of carboplatin 750 mg  Weight changes, if any: no, having trouble losing weight, she is trying  Bowel/Bladder  complaints, if any: none at this time  Nausea/Vomiting, if any: yes, has with chemo, reports getting dizzy with chemo as well  Pain issues, if any:  none to report  SAFETY ISSUES: Prior radiation? no Pacemaker/ICD? no Possible current pregnancy? No, hysterectomy Is the patient on methotrexate? no  Current Complaints / other details: No major concerns or questions. Just wants information overall. She is trusting the process and her doctors to guide her through.

## 2022-07-29 ENCOUNTER — Ambulatory Visit
Admission: RE | Admit: 2022-07-29 | Discharge: 2022-07-29 | Disposition: A | Discharge status: Home / Self Care | Payer: Self-pay | Source: Ambulatory Visit | Attending: Radiation Oncology | Admitting: Radiation Oncology

## 2022-07-29 ENCOUNTER — Other Ambulatory Visit: Payer: Self-pay

## 2022-07-29 VITALS — BP 141/89 | HR 91 | Temp 97.9°F | Resp 20 | Ht 60.0 in | Wt 227.4 lb

## 2022-07-29 DIAGNOSIS — E119 Type 2 diabetes mellitus without complications: Secondary | ICD-10-CM | POA: Insufficient documentation

## 2022-07-29 DIAGNOSIS — I700 Atherosclerosis of aorta: Secondary | ICD-10-CM | POA: Insufficient documentation

## 2022-07-29 DIAGNOSIS — I517 Cardiomegaly: Secondary | ICD-10-CM | POA: Insufficient documentation

## 2022-07-29 DIAGNOSIS — Z86718 Personal history of other venous thrombosis and embolism: Secondary | ICD-10-CM | POA: Insufficient documentation

## 2022-07-29 DIAGNOSIS — Z87442 Personal history of urinary calculi: Secondary | ICD-10-CM | POA: Insufficient documentation

## 2022-07-29 DIAGNOSIS — Z7984 Long term (current) use of oral hypoglycemic drugs: Secondary | ICD-10-CM | POA: Insufficient documentation

## 2022-07-29 DIAGNOSIS — Z7901 Long term (current) use of anticoagulants: Secondary | ICD-10-CM | POA: Insufficient documentation

## 2022-07-29 DIAGNOSIS — C541 Malignant neoplasm of endometrium: Secondary | ICD-10-CM

## 2022-07-29 DIAGNOSIS — Z800 Family history of malignant neoplasm of digestive organs: Secondary | ICD-10-CM | POA: Insufficient documentation

## 2022-07-29 DIAGNOSIS — Z79899 Other long term (current) drug therapy: Secondary | ICD-10-CM | POA: Insufficient documentation

## 2022-07-29 DIAGNOSIS — R160 Hepatomegaly, not elsewhere classified: Secondary | ICD-10-CM | POA: Insufficient documentation

## 2022-07-30 ENCOUNTER — Inpatient Hospital Stay: Payer: Self-pay | Admitting: Licensed Clinical Social Worker

## 2022-07-30 NOTE — Progress Notes (Signed)
[facility] Clinical Social Work  Clinical Social Work was referred by medical provider for assessment of psychosocial needs.  Clinical Social Worker contacted patient by phone to offer support and assess for needs. Patient stated primary needs are paying for medical bills related to cancer treatment and food. CSW informed patient of food bags that are available at the office. Patient will stop by the office tomorrow 7/26 to pick up a food bag.   Marguerita Merles, LCSWA  Clinical Social Worker [facility]

## 2022-07-31 ENCOUNTER — Telehealth: Payer: Self-pay

## 2022-07-31 NOTE — Telephone Encounter (Signed)
[facility] Clinical Social Work Visual merchandiser attempted to contact patient by phone to inform her food pantry bag will be at front desk.  Patient did not answer phone call and CSW was unable to leave vm. CSW informed front desk staff that food bag was for client.     Marguerita Merles, LCSWA Clinical Social Worker [facility]

## 2022-08-03 ENCOUNTER — Inpatient Hospital Stay: Payer: Self-pay | Admitting: Licensed Clinical Social Worker

## 2022-08-03 NOTE — Progress Notes (Signed)
[facility] CSW Progress Note  Clinical Child psychotherapist contacted patient by phone to follow up on food pantry bag, food resources, and assistance with medical bills. CSW provided patient with phone number for [employer] to coordinate future food pantry bags. CSW provided patient will Billing Customer Service number for her to discuss financial assistance programs for cancer related medical bills. CSW will mail out food panty resources and follow up via phone call in one week if patient is unable to pick up food bags.    Marguerita Merles, LCSWA Clinical Social Worker [facility]

## 2022-08-06 ENCOUNTER — Encounter: Payer: Self-pay | Admitting: Hematology and Oncology

## 2022-08-06 ENCOUNTER — Ambulatory Visit
Admission: RE | Admit: 2022-08-06 | Discharge: 2022-08-06 | Disposition: A | Discharge status: Home / Self Care | Payer: No Typology Code available for payment source | Source: Ambulatory Visit | Attending: Nurse Practitioner | Admitting: Nurse Practitioner

## 2022-08-06 DIAGNOSIS — Z1231 Encounter for screening mammogram for malignant neoplasm of breast: Secondary | ICD-10-CM

## 2022-08-07 ENCOUNTER — Other Ambulatory Visit: Payer: Self-pay | Admitting: Nurse Practitioner

## 2022-08-11 ENCOUNTER — Encounter: Payer: Self-pay | Admitting: Hematology and Oncology

## 2022-08-11 ENCOUNTER — Other Ambulatory Visit (HOSPITAL_COMMUNITY): Payer: Self-pay

## 2022-08-12 ENCOUNTER — Other Ambulatory Visit (HOSPITAL_COMMUNITY): Payer: Self-pay

## 2022-08-12 ENCOUNTER — Telehealth: Payer: Self-pay | Admitting: Nurse Practitioner

## 2022-08-12 NOTE — Telephone Encounter (Signed)
Info on there ortho referral

## 2022-08-17 NOTE — Telephone Encounter (Signed)
Called pt but no answer. Will try again later. KH

## 2022-08-19 NOTE — Telephone Encounter (Signed)
Pt was requesting info for dentist and eye doctors. Place in outgoing mail. KH

## 2022-08-21 ENCOUNTER — Encounter: Payer: Self-pay | Admitting: Hematology and Oncology

## 2022-08-24 ENCOUNTER — Ambulatory Visit (INDEPENDENT_AMBULATORY_CARE_PROVIDER_SITE_OTHER): Payer: Self-pay | Admitting: Nurse Practitioner

## 2022-08-24 ENCOUNTER — Encounter: Payer: Self-pay | Admitting: Nurse Practitioner

## 2022-08-24 ENCOUNTER — Other Ambulatory Visit (HOSPITAL_COMMUNITY): Payer: Self-pay

## 2022-08-24 VITALS — BP 126/81 | HR 95 | Resp 18 | Ht 60.0 in | Wt 225.0 lb

## 2022-08-24 DIAGNOSIS — E119 Type 2 diabetes mellitus without complications: Secondary | ICD-10-CM

## 2022-08-24 DIAGNOSIS — E785 Hyperlipidemia, unspecified: Secondary | ICD-10-CM

## 2022-08-24 DIAGNOSIS — Z114 Encounter for screening for human immunodeficiency virus [HIV]: Secondary | ICD-10-CM

## 2022-08-24 DIAGNOSIS — Z1159 Encounter for screening for other viral diseases: Secondary | ICD-10-CM

## 2022-08-24 LAB — POCT GLYCOSYLATED HEMOGLOBIN (HGB A1C): Hemoglobin A1C: 7 % — AB (ref 4.0–5.6)

## 2022-08-24 MED ORDER — METFORMIN HCL 500 MG PO TABS
ORAL_TABLET | ORAL | 4 refills | Status: DC
Start: 2022-08-24 — End: 2022-11-24
  Filled 2022-08-24: qty 60, fill #0
  Filled 2022-09-28: qty 60, 20d supply, fill #0
  Filled 2022-11-04: qty 60, 20d supply, fill #1

## 2022-08-24 NOTE — Patient Instructions (Signed)
Please get your Tdap vaccine, shingles vaccine at the pharmacy.  Please check with your oncologist to see if you can get the shingles vaccine.   1. Type 2 diabetes mellitus without complication, without long-term current use of insulin (HCC)  - POCT glycosylated hemoglobin (Hb A1C) - Lipid panel - metFORMIN (GLUCOPHAGE) 500 MG tablet; Take 2 tablets(1000 mg) by mouth in the morning with breakfast and 1 tablet (500mg )  with dinner  Dispense: 60 tablet; Refill: 4  2. Screening for HIV (human immunodeficiency virus)  - HIV Antibody (routine testing w rflx)    It is important that you exercise regularly at least 30 minutes 5 times a week as tolerated  Think about what you will eat, plan ahead. Choose " clean, green, fresh or frozen" over canned, processed or packaged foods which are more sugary, salty and fatty. 70 to 75% of food eaten should be vegetables and fruit. Three meals at set times with snacks allowed between meals, but they must be fruit or vegetables. Aim to eat over a 12 hour period , example 7 am to 7 pm, and STOP after  your last meal of the day. Drink water,generally about 64 ounces per day, no other drink is as healthy. Fruit juice is best enjoyed in a healthy way, by EATING the fruit.  Thanks for choosing [facility] we consider it a privelige to serve you.

## 2022-08-24 NOTE — Progress Notes (Signed)
Established Patient Office Visit  Subjective:  Patient ID: Maureen Campos, female    DOB: 1970-10-13  Age: 52 y.o. MRN: [id number]  CC:  Chief Complaint  Patient presents with   Medical Management of Chronic Issues    Unsure of dates for any testing pap, colonoscopy etc    HPI Maureen Campos is a 52 y.o. 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   Patient presents for follow-up for her chronic medical conditions She denies any adverse reactions to current medications  She has been following up regularly with her oncologist for treatment of her and to make sure cancer, now present treatment every 6 weeks.  She denies fever, chills, malaise  Type 2 diabetes.  Currently on metformin 500 mg twice daily.  She will fasting blood sugars of 120, random blood sugar readings of 150.  She has been eating more veggies avoiding juices.  Not exercising but plans to start exercising.   Due for shingles vaccine Tdap vaccine at need for both vaccines discussed patient encouraged to get the vaccines at the pharmacy  She has received the Cologuard testing kits encouraged to get the test completed.    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)    Diabetes mellitus without complication (HCC)    Dyspnea    with activity   Endometrial cancer (HCC)    Family history of colon cancer 03/04/2022   Family history of uterine cancer    Fast heart beat    with activity   Fatigue    Hearing loss    Left ear   History of kidney stones    12 years ago   Lazy eye, left    Left leg numbness    occ   Migraine    occ    Past  Surgical History:  Procedure Laterality Date   CHOLECYSTECTOMY  2011   IR CHEST FLUORO  04/07/2022   IR CV LINE INJECTION  04/09/2022   IR IMAGING GUIDED PORT INSERTION  03/05/2022   IR PORT REPAIR CENTRAL VENOUS ACCESS DEVICE  04/09/2022   IR US GUIDE VASC ACCESS RIGHT  04/09/2022   ROBOTIC ASSISTED TOTAL HYSTERECTOMY WITH BILATERAL SALPINGO OOPHERECTOMY N/A 02/10/2022   Procedure: XI ROBOTIC ASSISTED TOTAL HYSTERECTOMY WITH BILATERAL SALPINGO OOPHORECTOMY;  Surgeon: Clide Cliff, MD;  Location: [facility];  Service: Gynecology;  Laterality: N/A;   ROBOTIC PELVIC AND PARA-AORTIC LYMPH NODE DISSECTION N/A 02/10/2022   Procedure: XI ROBOTIC PELVIC AND PARA-AORTIC SENTINEL LYMPH NODE DISSECTION;  Surgeon: Clide Cliff, MD;  Location: [facility];  Service: Gynecology;  Laterality: N/A;    Family History  Problem Relation Age of Onset   Colon cancer Father 55   Uterine cancer Sister 36   Melanoma Sister 34   Cancer Paternal Aunt        possible brain cancer, dx > 50   Breast cancer Neg Hx    Ovarian cancer Neg Hx    Endometrial cancer Neg Hx    Pancreatic cancer Neg Hx    Prostate cancer Neg Hx     Social History   Socioeconomic History   Marital status: Significant Other    Spouse name:  Not on file   Number of children: 4   Years of education: Not on file   Highest education level: Not on file  Occupational History   Not on file  Tobacco Use   Smoking status: Never   Smokeless tobacco: Never  Vaping Use   Vaping status: Never Used  Substance and Sexual Activity   Alcohol use: Not Currently    Comment: every weekend   Drug use: No   Sexual activity: Yes    Birth control/protection: None  Other Topics Concern   Not on file  Social History Narrative   Lives with her daughter   Social Determinants of Health   Financial Resource Strain: Not on file  Food Insecurity: Food Insecurity Present (07/28/2022)   Hunger Vital Sign    Worried About Running Out of Food in the Last Year:  Sometimes true    Ran Out of Food in the Last Year: Sometimes true  Transportation Needs: No Transportation Needs (07/29/2022)   PRAPARE - Administrator, Civil Service (Medical): No    Lack of Transportation (Non-Medical): No  Physical Activity: Not on file  Stress: Not on file  Social Connections: Not on file  Intimate Partner Violence: Not At Risk (07/29/2022)   Humiliation, Afraid, Rape, and Kick questionnaire    Fear of Current or Ex-Partner: No    Emotionally Abused: No    Physically Abused: No    Sexually Abused: No    Outpatient Medications Prior to Visit  Medication Sig Dispense Refill   apixaban (ELIQUIS) 5 MG TABS tablet Take 1 tablet (5 mg total) by mouth 2 (two) times daily. 180 tablet 3   atorvastatin (LIPITOR) 10 MG tablet Take 1 tablet (10 mg total) by mouth daily. 90 tablet 0   Blood Glucose Monitoring Suppl DEVI 1 each by Does not apply route in the morning, at noon, and at bedtime. May substitute to any manufacturer covered by patient's insurance. 1 each 0   lidocaine-prilocaine (EMLA) cream Apply to affected area once 30 g 3   ondansetron (ZOFRAN) 8 MG tablet Take 1 tablet (8 mg total) by mouth every 8 (eight) hours as needed for nausea or vomiting. Start on the third day after chemotherapy. 30 tablet 1   prochlorperazine (COMPAZINE) 10 MG tablet Take 1 tablet (10 mg total) by mouth every 6 (six) hours as needed for nausea or vomiting. 30 tablet 1   metFORMIN (GLUCOPHAGE) 500 MG tablet Take 1 tablet (500 mg total) by mouth 2 (two) times daily with a meal. 60 tablet 3   No facility-administered medications prior to visit.    Allergies  Allergen Reactions   Paclitaxel Shortness Of Breath and Other (See Comments)    See progress note from 04/10/22   Advil [Ibuprofen] Itching and Rash    ROS Review of Systems  Constitutional:  Negative for activity change, appetite change, chills, fatigue and fever.  HENT:  Negative for congestion, dental problem, ear  discharge, ear pain, hearing loss, rhinorrhea, sinus pressure, sinus pain, sneezing and sore throat.   Eyes:  Negative for pain, discharge, redness and itching.  Respiratory:  Negative for cough, chest tightness, shortness of breath and wheezing.   Cardiovascular:  Negative for chest pain, palpitations and leg swelling.  Gastrointestinal:  Negative for abdominal distention, abdominal pain, anal bleeding, blood in stool, constipation, diarrhea, nausea, rectal pain and vomiting.  Endocrine: Negative for cold intolerance, heat intolerance, polydipsia, polyphagia and polyuria.  Genitourinary:  Negative for difficulty urinating,  dysuria, flank pain, frequency, hematuria, menstrual problem, pelvic pain and vaginal bleeding.  Musculoskeletal:  Negative for arthralgias, back pain, gait problem, joint swelling and myalgias.  Skin:  Negative for color change, pallor, rash and wound.  Allergic/Immunologic: Negative for environmental allergies, food allergies and immunocompromised state.  Neurological:  Negative for dizziness, tremors, facial asymmetry, weakness and headaches.  Hematological:  Negative for adenopathy. Does not bruise/bleed easily.  Psychiatric/Behavioral:  Negative for agitation, behavioral problems, confusion, decreased concentration, hallucinations, self-injury and suicidal ideas.       Objective:    Physical Exam Vitals and nursing note reviewed.  Constitutional:      General: She is not in acute distress.    Appearance: Normal appearance. She is obese. She is not ill-appearing, toxic-appearing or diaphoretic.  HENT:     Mouth/Throat:     Mouth: Mucous membranes are moist.     Pharynx: Oropharynx is clear. No oropharyngeal exudate or posterior oropharyngeal erythema.  Eyes:     General: No scleral icterus.       Right eye: No discharge.        Left eye: No discharge.     Extraocular Movements: Extraocular movements intact.     Conjunctiva/sclera: Conjunctivae normal.   Cardiovascular:     Rate and Rhythm: Normal rate and regular rhythm.     Pulses: Normal pulses.     Heart sounds: Normal heart sounds. No murmur heard.    No friction rub. No gallop.  Pulmonary:     Effort: Pulmonary effort is normal. No respiratory distress.     Breath sounds: Normal breath sounds. No stridor. No wheezing, rhonchi or rales.  Chest:     Chest wall: No tenderness.  Abdominal:     General: There is no distension.     Palpations: Abdomen is soft.     Tenderness: There is no abdominal tenderness. There is no right CVA tenderness, left CVA tenderness or guarding.  Musculoskeletal:        General: No swelling, tenderness, deformity or signs of injury.     Right lower leg: No edema.     Left lower leg: No edema.  Skin:    General: Skin is warm and dry.     Capillary Refill: Capillary refill takes less than 2 seconds.     Coloration: Skin is not jaundiced or pale.     Findings: No bruising, erythema or lesion.  Neurological:     Mental Status: She is alert and oriented to person, place, and time.     Motor: No weakness.     Coordination: Coordination normal.     Gait: Gait normal.  Psychiatric:        Mood and Affect: Mood normal.        Behavior: Behavior normal.        Thought Content: Thought content normal.        Judgment: Judgment normal.     BP 126/81   Pulse 95   Resp 18   Ht 5' (1.524 m)   Wt 225 lb (102.1 kg)   LMP 01/06/2022 Comment: Hysterectomy 02/10/22  SpO2 97%   BMI 43.94 kg/m  Wt Readings from Last 3 Encounters:  08/24/22 225 lb (102.1 kg)  07/28/22 227 lb 6.4 oz (103.1 kg)  07/24/22 228 lb 9.6 oz (103.7 kg)    Lab Results  Component Value Date   TSH 3.350 07/24/2022   Lab Results  Component Value Date   WBC 5.5 07/24/2022   HGB  11.1 (L) 07/24/2022   HCT 34.5 (L) 07/24/2022   MCV 88.9 07/24/2022   PLT 153 07/24/2022   Lab Results  Component Value Date   NA 138 07/24/2022   K 3.9 07/24/2022   CO2 27 07/24/2022   GLUCOSE 152  (H) 07/24/2022   BUN 12 07/24/2022   CREATININE 0.42 (L) 07/24/2022   BILITOT 0.3 07/24/2022   ALKPHOS 52 07/24/2022   AST 40 07/24/2022   ALT 48 (H) 07/24/2022   PROT 7.1 07/24/2022   ALBUMIN 3.8 07/24/2022   CALCIUM 9.3 07/24/2022   ANIONGAP 7 07/24/2022   Lab Results  Component Value Date   CHOL 270 (H) 06/15/2022   Lab Results  Component Value Date   HDL 43 06/15/2022   Lab Results  Component Value Date   LDLCALC 177 (H) 06/15/2022   Lab Results  Component Value Date   TRIG 261 (H) 06/15/2022   Lab Results  Component Value Date   CHOLHDL 6.3 (H) 06/15/2022   Lab Results  Component Value Date   HGBA1C 7.0 (A) 08/24/2022      Assessment & Plan:   Problem List Items Addressed This Visit       Endocrine   Diabetes mellitus type 2, uncomplicated (HCC) - Primary    Lab Results  Component Value Date   HGBA1C 7.0 (A) 08/24/2022  Start metformin 1000 mg in the morning 500 mg with dinner CBG goals discussed Patient counseled on low-carb modified diet Encouraged to start doing some moderate exercises at least 150 minutes weekly Need for yearly diabetic eye exam discussed Follow-up in 3 months       Relevant Medications   metFORMIN (GLUCOPHAGE) 500 MG tablet   Other Relevant Orders   POCT glycosylated hemoglobin (Hb A1C) (Completed)   Lipid panel     Other   Obesity, Class III, BMI 40-49.9 (morbid obesity) (HCC)    Wt Readings from Last 3 Encounters:  08/24/22 225 lb (102.1 kg)  07/28/22 227 lb 6.4 oz (103.1 kg)  07/24/22 228 lb 9.6 oz (103.7 kg)   Body mass index is 43.94 kg/m.  Patient counseled on low-carb modified diet Encouraged engage in regular moderate exercise at least 150 minutes weekly      Relevant Medications   metFORMIN (GLUCOPHAGE) 500 MG tablet   Dyslipidemia, goal LDL below 70    Currently on atorvastatin 10 mg daily Checking lipid panel today   The 10-year ASCVD risk score (Arnett DK, et al., 2019) is: 4.7%   Values used to  calculate the score:     Age: 49 years     Sex: Female     Is Non-Hispanic African American: No     Diabetic: Yes     Tobacco smoker: No     Systolic Blood Pressure: 126 mmHg     Is BP treated: No     HDL Cholesterol: 43 mg/dL     Total Cholesterol: 270 mg/dL       Other Visit Diagnoses     Screening for HIV (human immunodeficiency virus)       Relevant Orders   HIV Antibody (routine testing w rflx)   Need for hepatitis C screening test       Relevant Orders   Hepatitis C antibody       Meds ordered this encounter  Medications   metFORMIN (GLUCOPHAGE) 500 MG tablet    Sig: Take 2 tablets(1000 mg) by mouth in the morning with breakfast and 1 tablet (500mg )  with dinner    Dispense:  60 tablet    Refill:  4    Follow-up: Return in about 3 months (around 11/24/2022) for DM, HYPERLIPIDEMIA.    Donell Beers, FNP

## 2022-08-24 NOTE — Assessment & Plan Note (Signed)
Currently on atorvastatin 10 mg daily Checking lipid panel today   The 10-year ASCVD risk score (Arnett DK, et al., 2019) is: 4.7%   Values used to calculate the score:     Age: 52 years     Sex: Female     Is Non-Hispanic African American: No     Diabetic: Yes     Tobacco smoker: No     Systolic Blood Pressure: 126 mmHg     Is BP treated: No     HDL Cholesterol: 43 mg/dL     Total Cholesterol: 270 mg/dL

## 2022-08-24 NOTE — Assessment & Plan Note (Signed)
Lab Results  Component Value Date   HGBA1C 7.0 (A) 08/24/2022  Start metformin 1000 mg in the morning 500 mg with dinner CBG goals discussed Patient counseled on low-carb modified diet Encouraged to start doing some moderate exercises at least 150 minutes weekly Need for yearly diabetic eye exam discussed Follow-up in 3 months

## 2022-08-24 NOTE — Assessment & Plan Note (Signed)
Wt Readings from Last 3 Encounters:  08/24/22 225 lb (102.1 kg)  07/28/22 227 lb 6.4 oz (103.1 kg)  07/24/22 228 lb 9.6 oz (103.7 kg)   Body mass index is 43.94 kg/m.  Patient counseled on low-carb modified diet Encouraged engage in regular moderate exercise at least 150 minutes weekly

## 2022-08-25 ENCOUNTER — Encounter: Payer: Self-pay | Admitting: Hematology and Oncology

## 2022-08-25 ENCOUNTER — Other Ambulatory Visit: Payer: Self-pay | Admitting: Nurse Practitioner

## 2022-08-25 ENCOUNTER — Other Ambulatory Visit: Payer: Self-pay

## 2022-08-25 ENCOUNTER — Other Ambulatory Visit (HOSPITAL_COMMUNITY): Payer: Self-pay

## 2022-08-25 DIAGNOSIS — E785 Hyperlipidemia, unspecified: Secondary | ICD-10-CM

## 2022-08-25 LAB — LIPID PANEL
Chol/HDL Ratio: 4.6 ratio — ABNORMAL HIGH (ref 0.0–4.4)
Cholesterol, Total: 178 mg/dL (ref 100–199)
HDL: 39 mg/dL — ABNORMAL LOW (ref >39)
LDL Chol Calc (NIH): 109 mg/dL — ABNORMAL HIGH (ref 0–99)
Triglycerides: 171 mg/dL — ABNORMAL HIGH (ref 0–149)
VLDL Cholesterol Cal: 30 mg/dL (ref 5–40)

## 2022-08-25 LAB — HEPATITIS C ANTIBODY: Hep C Virus Ab: NONREACTIVE

## 2022-08-25 LAB — HIV ANTIBODY (ROUTINE TESTING W REFLEX): HIV Screen 4th Generation wRfx: NONREACTIVE

## 2022-08-25 MED ORDER — ATORVASTATIN CALCIUM 20 MG PO TABS
20.0000 mg | ORAL_TABLET | Freq: Every day | ORAL | 1 refills | Status: DC
Start: 2022-08-25 — End: 2022-11-25
  Filled 2022-08-25: qty 90, 90d supply, fill #0

## 2022-08-26 ENCOUNTER — Other Ambulatory Visit (HOSPITAL_COMMUNITY): Payer: Self-pay

## 2022-09-04 ENCOUNTER — Other Ambulatory Visit: Payer: Self-pay

## 2022-09-04 ENCOUNTER — Encounter: Payer: Self-pay | Admitting: Hematology and Oncology

## 2022-09-04 ENCOUNTER — Inpatient Hospital Stay: Payer: No Typology Code available for payment source

## 2022-09-04 ENCOUNTER — Telehealth: Payer: Self-pay | Admitting: Oncology

## 2022-09-04 ENCOUNTER — Inpatient Hospital Stay: Payer: No Typology Code available for payment source | Attending: Psychiatry

## 2022-09-04 ENCOUNTER — Inpatient Hospital Stay
Payer: No Typology Code available for payment source | Attending: Hematology and Oncology | Admitting: Hematology and Oncology

## 2022-09-04 VITALS — BP 140/76 | HR 74

## 2022-09-04 VITALS — BP 131/83 | HR 85 | Temp 98.6°F | Resp 18 | Ht 60.0 in | Wt 228.2 lb

## 2022-09-04 DIAGNOSIS — C541 Malignant neoplasm of endometrium: Secondary | ICD-10-CM

## 2022-09-04 DIAGNOSIS — Z90710 Acquired absence of both cervix and uterus: Secondary | ICD-10-CM | POA: Insufficient documentation

## 2022-09-04 DIAGNOSIS — Z5112 Encounter for antineoplastic immunotherapy: Secondary | ICD-10-CM | POA: Insufficient documentation

## 2022-09-04 DIAGNOSIS — D509 Iron deficiency anemia, unspecified: Secondary | ICD-10-CM | POA: Insufficient documentation

## 2022-09-04 DIAGNOSIS — E119 Type 2 diabetes mellitus without complications: Secondary | ICD-10-CM

## 2022-09-04 DIAGNOSIS — Z90722 Acquired absence of ovaries, bilateral: Secondary | ICD-10-CM | POA: Insufficient documentation

## 2022-09-04 DIAGNOSIS — Z9079 Acquired absence of other genital organ(s): Secondary | ICD-10-CM | POA: Insufficient documentation

## 2022-09-04 DIAGNOSIS — R748 Abnormal levels of other serum enzymes: Secondary | ICD-10-CM | POA: Insufficient documentation

## 2022-09-04 LAB — CBC WITH DIFFERENTIAL (CANCER CENTER ONLY)
Abs Immature Granulocytes: 0.01 10*3/uL (ref 0.00–0.07)
Basophils Absolute: 0 10*3/uL (ref 0.0–0.1)
Basophils Relative: 0 %
Eosinophils Absolute: 0.1 10*3/uL (ref 0.0–0.5)
Eosinophils Relative: 2 %
HCT: 35.8 % — ABNORMAL LOW (ref 36.0–46.0)
Hemoglobin: 11.4 g/dL — ABNORMAL LOW (ref 12.0–15.0)
Immature Granulocytes: 0 %
Lymphocytes Relative: 37 %
Lymphs Abs: 2.8 10*3/uL (ref 0.7–4.0)
MCH: 29.7 pg (ref 26.0–34.0)
MCHC: 31.8 g/dL (ref 30.0–36.0)
MCV: 93.2 fL (ref 80.0–100.0)
Monocytes Absolute: 0.7 10*3/uL (ref 0.1–1.0)
Monocytes Relative: 8 %
Neutro Abs: 4.1 10*3/uL (ref 1.7–7.7)
Neutrophils Relative %: 53 %
Platelet Count: 262 10*3/uL (ref 150–400)
RBC: 3.84 MIL/uL — ABNORMAL LOW (ref 3.87–5.11)
RDW: 15.9 % — ABNORMAL HIGH (ref 11.5–15.5)
WBC Count: 7.7 10*3/uL (ref 4.0–10.5)
nRBC: 0 % (ref 0.0–0.2)

## 2022-09-04 LAB — CMP (CANCER CENTER ONLY)
ALT: 51 U/L — ABNORMAL HIGH (ref 0–44)
AST: 43 U/L — ABNORMAL HIGH (ref 15–41)
Albumin: 3.8 g/dL (ref 3.5–5.0)
Alkaline Phosphatase: 49 U/L (ref 38–126)
Anion gap: 6 (ref 5–15)
BUN: 12 mg/dL (ref 6–20)
CO2: 27 mmol/L (ref 22–32)
Calcium: 9 mg/dL (ref 8.9–10.3)
Chloride: 104 mmol/L (ref 98–111)
Creatinine: 0.49 mg/dL (ref 0.44–1.00)
GFR, Estimated: >60 mL/min (ref >60)
Glucose, Bld: 151 mg/dL — ABNORMAL HIGH (ref 70–99)
Potassium: 3.8 mmol/L (ref 3.5–5.1)
Sodium: 137 mmol/L (ref 135–145)
Total Bilirubin: 0.3 mg/dL (ref 0.3–1.2)
Total Protein: 7.4 g/dL (ref 6.5–8.1)

## 2022-09-04 LAB — TSH: TSH: 2.789 u[IU]/mL (ref 0.350–4.500)

## 2022-09-04 MED ORDER — SODIUM CHLORIDE 0.9% FLUSH
10.0000 mL | Freq: Once | INTRAVENOUS | Status: AC
  Administered 2022-09-04: 10 mL

## 2022-09-04 MED ORDER — SODIUM CHLORIDE 0.9 % IV SOLN: Freq: Once | INTRAVENOUS | Status: AC

## 2022-09-04 MED ORDER — SODIUM CHLORIDE 0.9 % IV SOLN
1000.0000 mg | Freq: Once | INTRAVENOUS | Status: AC
  Administered 2022-09-04: 1000 mg via INTRAVENOUS
  Filled 2022-09-04: qty 20

## 2022-09-04 MED ORDER — HEPARIN SOD (PORK) LOCK FLUSH 100 UNIT/ML IV SOLN
500.0000 [IU] | Freq: Once | INTRAVENOUS | Status: AC | PRN
  Administered 2022-09-04: 500 [IU]

## 2022-09-04 MED ORDER — SODIUM CHLORIDE 0.9% FLUSH
10.0000 mL | INTRAVENOUS | Status: DC | PRN
  Administered 2022-09-04: 10 mL

## 2022-09-04 NOTE — Assessment & Plan Note (Signed)
This is likely anemia of chronic disease. The patient denies recent history of bleeding such as epistaxis, hematuria or hematochezia. She is asymptomatic from the anemia. We will observe for now.  She does not require transfusion now. I do not recommend any further work-up at this time.   

## 2022-09-04 NOTE — Telephone Encounter (Signed)
Called [place] and advised her of CT scan appointment on 10/09/22 at 2:00 following her port flush/lab appointment at 1:00.  Gave her instructions for nothing to eat for 4 hours prior to the scan but that she had have liquids to drink.  She verbalized understanding and agreement.

## 2022-09-04 NOTE — Assessment & Plan Note (Signed)
Likely due to fatty liver disease I will order CT imaging to assess

## 2022-09-04 NOTE — Assessment & Plan Note (Signed)
Your blood sugar is improving with discontinuation of chemotherapy

## 2022-09-04 NOTE — Assessment & Plan Note (Signed)
I reviewed CT imaging with her She has no evidence of disease Overall, she tolerated treatment very well She is recovering from side effects of chemotherapy We will continue maintenance immunotherapy for 2 years I plan to repeat imaging in 6 months, her next imaging in October

## 2022-09-04 NOTE — Patient Instructions (Signed)
[facility]  Discharge Instructions: Thank you for choosing [facility] to provide your oncology and hematology care.   If you have a lab appointment with the Cancer Center, please go directly to the Cancer Center and check in at the registration area.   Wear comfortable clothing and clothing appropriate for easy access to any Portacath or PICC line.   We strive to give you quality time with your provider. You may need to reschedule your appointment if you arrive late (15 or more minutes).  Arriving late affects you and other patients whose appointments are after yours.  Also, if you miss three or more appointments without notifying the office, you may be dismissed from the clinic at the provider's discretion.      For prescription refill requests, have your pharmacy contact our office and allow 72 hours for refills to be completed.    Today you received the following chemotherapy and/or immunotherapy agents Jemperli To help prevent nausea and vomiting after your treatment, we encourage you to take your nausea medication as directed.  BELOW ARE SYMPTOMS THAT SHOULD BE REPORTED IMMEDIATELY: *FEVER GREATER THAN 100.4 F (38 C) OR HIGHER *CHILLS OR SWEATING *NAUSEA AND VOMITING THAT IS NOT CONTROLLED WITH YOUR NAUSEA MEDICATION *UNUSUAL SHORTNESS OF BREATH *UNUSUAL BRUISING OR BLEEDING *URINARY PROBLEMS (pain or burning when urinating, or frequent urination) *BOWEL PROBLEMS (unusual diarrhea, constipation, pain near the anus) TENDERNESS IN MOUTH AND THROAT WITH OR WITHOUT PRESENCE OF ULCERS (sore throat, sores in mouth, or a toothache) UNUSUAL RASH, SWELLING OR PAIN  UNUSUAL VAGINAL DISCHARGE OR ITCHING   Items with * indicate a potential emergency and should be followed up as soon as possible or go to the Emergency Department if any problems should occur.  Please show the CHEMOTHERAPY ALERT CARD or IMMUNOTHERAPY ALERT CARD at check-in to  the Emergency Department and triage nurse.  Should you have questions after your visit or need to cancel or reschedule your appointment, please contact [facility]  Dept: [phone]  and follow the prompts.  Office hours are 8:00 a.m. to 4:30 p.m. Monday - Friday. Please note that voicemails left after 4:00 p.m. may not be returned until the following business day.  We are closed weekends and major holidays. You have access to a nurse at all times for urgent questions. Please call the main number to the clinic Dept: [phone] and follow the prompts.   For any non-urgent questions, you may also contact your provider using MyChart. We now offer e-Visits for anyone 18 and older to request care online for non-urgent symptoms. For details visit [email or web address].   Also download the MyChart app! Go to the app store, search "MyChart", open the app, select [facility], and log in with your MyChart username and password.  

## 2022-09-04 NOTE — Progress Notes (Signed)
[facility] OFFICE PROGRESS NOTE  Patient Care Team: Donell Beers, FNP as PCP - General (Nurse Practitioner)  ASSESSMENT & PLAN:  Endometrial cancer [facility]) I reviewed CT imaging with her She has no evidence of disease Overall, she tolerated treatment very well She is recovering from side effects of chemotherapy We will continue maintenance immunotherapy for 2 years I plan to repeat imaging in 6 months, her next imaging in October  Microcytic anemia This is likely anemia of chronic disease. The patient denies recent history of bleeding such as epistaxis, hematuria or hematochezia. She is asymptomatic from the anemia. We will observe for now.  She does not require transfusion now. I do not recommend any further work-up at this time.    Diabetes mellitus type 2, uncomplicated (HCC) Your blood sugar is improving with discontinuation of chemotherapy   Elevated liver enzymes Likely due to fatty liver disease I will order CT imaging to assess  Orders Placed This Encounter  Procedures   CT CHEST ABDOMEN PELVIS W CONTRAST    Standing Status:   Future    Standing Expiration Date:   09/04/2023    Scheduling Instructions:     No need oral contrast    Order Specific Question:   If indicated for the ordered procedure, I authorize the administration of contrast media per Radiology protocol    Answer:   Yes    Order Specific Question:   Does the patient have a contrast media/X-ray dye allergy?    Answer:   No    Order Specific Question:   Preferred imaging location?    Answer:   [facility]    Order Specific Question:   If indicated for the ordered procedure, I authorize the administration of oral contrast media per Radiology protocol    Answer:   Yes    Order Specific Question:   Is patient pregnant?    Answer:   No    All questions were answered. The patient knows to call the clinic with any problems, questions or concerns. The total time spent in the  appointment was 30 minutes encounter with patients including review of chart and various tests results, discussions about plan of care and coordination of care plan   Artis Delay, MD 09/04/2022 2:57 PM  INTERVAL HISTORY: Please see below for problem oriented charting. she returns for treatment follow-up on maintenance immunotherapy She denies side effects from recent treatment She is feeling better since discontinuation of chemotherapy Her blood sugar control has improved  REVIEW OF SYSTEMS:   Constitutional: Denies fevers, chills or abnormal weight loss Eyes: Denies blurriness of vision Ears, nose, mouth, throat, and face: Denies mucositis or sore throat Respiratory: Denies cough, dyspnea or wheezes Cardiovascular: Denies palpitation, chest discomfort or lower extremity swelling Gastrointestinal:  Denies nausea, heartburn or change in bowel habits Skin: Denies abnormal skin rashes Lymphatics: Denies new lymphadenopathy or easy bruising Neurological:Denies numbness, tingling or new weaknesses Behavioral/Psych: Mood is stable, no new changes  All other systems were reviewed with the patient and are negative.  I have reviewed the past medical history, past surgical history, social history and family history with the patient and they are unchanged from previous note.  ALLERGIES:  is allergic to paclitaxel and advil [ibuprofen].  MEDICATIONS:  Current Outpatient Medications  Medication Sig Dispense Refill   apixaban (ELIQUIS) 5 MG TABS tablet Take 1 tablet (5 mg total) by mouth 2 (two) times daily. 180 tablet 3   atorvastatin (LIPITOR) 20 MG  tablet Take 1 tablet (20 mg total) by mouth daily. 90 tablet 1   Blood Glucose Monitoring Suppl DEVI 1 each by Does not apply route in the morning, at noon, and at bedtime. May substitute to any manufacturer covered by patient's insurance. 1 each 0   lidocaine-prilocaine (EMLA) cream Apply to affected area once 30 g 3   metFORMIN (GLUCOPHAGE) 500 MG  tablet Take 2 tablets(1000 mg) by mouth in the morning with breakfast and 1 tablet (500mg )  with dinner 60 tablet 4   ondansetron (ZOFRAN) 8 MG tablet Take 1 tablet (8 mg total) by mouth every 8 (eight) hours as needed for nausea or vomiting. Start on the third day after chemotherapy. 30 tablet 1   prochlorperazine (COMPAZINE) 10 MG tablet Take 1 tablet (10 mg total) by mouth every 6 (six) hours as needed for nausea or vomiting. 30 tablet 1   No current facility-administered medications for this visit.   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 Endometrial thickening possibly due to obstruction from the cystic mass. Direct visualization is recommended.   Enlarged right para-aortic and right external iliac nodes are indeterminate but concerning for metastasis given cervical lesion.   Borderline enlargement and fecalization of the distal ileum. No definite transition point. Findings suggestive of slow transit/constipation.   01/14/2022 Initial Biopsy   Endometrial biopsy and ECC: A. ENDOCERVIX, CURETTAGE: - BENIGN ENDOCERVICAL MUCOSA WITH ACUTE AND CHRONIC INFLAMMATION - NEGATIVE FOR MALIGNANCY  B. 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  The tumor cells are partially positive for p16. The p53 stain shows foci with wild-type staining and foci  with overexpression.  The tumor cells show patchy positivity for ER and PR and are negative for Napsin A.  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  Drs. Arthur Holms reviewed the case and agree with the above diagnosis.  Clinical correlation recommended.    01/14/2022 Initial Diagnosis   Endometrial cancer (HCC)   01/24/2022 Imaging   MR pelvis 1. There is an enhancing mass identified within the lower uterine segment which measures 4.3 x 3.9 by 4.7 cm. This invades the anterior myometrium within the lower uterine segment. This appears to terminate at the level of the internal os of the cervix. Findings are compatible with endometrial carcinoma. 2. Aortocaval, right common iliac, and bilateral pelvic sidewall lymph nodes are enlarged compatible with metastatic adenopathy. Given the presence of periaortic and pelvic node involvement imaging findings are compatible with stage IIIC2 disease.   02/06/2022 PET scan   1. Diffusely increased uptake within the endometrium and cervix compatible with known endometrial carcinoma. 2. Tracer avid retroperitoneal and pelvic lymph nodes compatible with nodal metastasis. 3. No signs of solid organ metastasis or distant metastatic disease. 4. Nonspecific, diffuse increased bone marrow uptake is noted within the axial and appendicular  skeleton. No focal areas of increased uptake to suggest osseous metastasis. 5. Multifocal bilateral areas of fibrosis, architectural distortion and scarring within both lungs which is favored to represent a inflammatory or infectious process. Findings may be the sequelae of prior atypical viral infection.   02/10/2022 Pathology Results   A. RIGHT AORTOCAVAL LYMPH NODE, EXCISION: Two benign lymph nodes, negative for carcinoma (0/2)  B. RIGHT PELVIC LYMPH NODE, EXCISION: Two of two lymph nodes with metastatic  adenocarcinoma (2/2)  C. LEFT OBTURATOR PELVIC LYMPH NODE, EXCISION: Three lymph nodes, negative for carcinoma (0/3)  D. 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 RIGHT POSTERIOR CERVICAL MARGIN, EXCISION: Benign cervical stroma Negative for carcinoma  ONCOLOGY TABLE:  UTERUS, CARCINOMA OR CARCINOSARCOMA: Resection  Procedure: Total hysterectomy and bilateral salpingo-oophorectomy with node sampling Histologic Type: Endometrioid adenocarcinoma Histologic Grade: High-grade, FIGO 3 Myometrial Invasion:      Depth of Myometrial Invasion (mm): 10 mm      Myometrial Thickness (mm): 33 mm      Percentage of Myometrial Invasion: 30% Uterine Serosa Involvement: Not identified Cervical stromal Involvement: Not identified Extent of involvement of other tissue/organs: Not identified Peritoneal/Ascitic Fluid: []  Lymphovascular Invasion: Present Regional Lymph Nodes:      Pelvic Lymph Nodes Examined: 5 nonsentinel lymph nodes      Pelvic Lymph Nodes with Metastasis: 2          Macrometastasis: (>2.0 mm): 2          Micrometastasis: (>0.2 mm and < 2.0 mm): 0          Isolated Tumor Cells (<0.2 mm): 0          Laterality of Lymph Node with Tumor: Right          Extracapsular Extension: Not identified      Para-aortic Lymph Nodes Examined: 2 nonsentinel lymph nodes       Para-aortic Lymph Nodes with  Metastasis: 0          Macrometastasis: (>2.0 mm): 0          Micrometastasis: (>0.2 mm and < 2.0 mm): 0          Isolated Tumor Cells (<0.2 mm): 0 Distant Metastasis: Not applicable Pathologic Stage Classification (pTNM, AJCC 8th Edition): pT1a, pN1a  Ancillary Studies: MMR testing has been ordered and the results will be issued within an addendum to this report.    02/10/2022 Surgery   Preoperative Diagnosis: Endometrioid endometrial cancer FIGO grade 2, Morbid obesity (BMI 44)    Procedures: Robotic-assisted total laparoscopic hysterectomy, bilateral salpingo-oophorectomy, bilateral sentinel lymph node injection and debulking of enlarge, PET avid pelvic and para-aortic lymph nodes (Modifer 22: extreme morbid obesity, BMI 44, with significant retroperitoneal and intraperitoneal adiposity requiring additional OR personnel for positioning and retraction. Obesity made retroperitoneal visualization limited, increasing the complexity of the case and necessitating additional instrumentation for retraction and to create safe exposure. Obesity related complexity increased the duration of the procedure by 60 minutes.)   Surgeon: Clide Cliff, MD    Findings:  Normal upper abdominal survey with normal liver surface and diaphragm. Normal appearing small and large bowel. Globally enlarged uterus. Overall normal tubes, and ovaries, small paratubal cyst. No evidence of peritoneal disease, ascites, or carcinomatosis. Enlarged bilateral pelvic and right para-aortic lymph nodes removed. Tumor spill into the vagina and pelvis noted with colpotomy. Pelvis and vagina copiously irrigated. Significant intra-abdominal adiposity.   02/23/2022 Cancer Staging   Staging form: Corpus Uteri - Carcinoma and Carcinosarcoma, AJCC 8th Edition - Pathologic stage from 02/23/2022: Stage IIIC1 (pT1a, pN1, cM0) - Signed by Artis Delay, MD on 02/23/2022 Stage prefix: Initial diagnosis   03/05/2022 Procedure   Status post right IJ  port catheter placement.    03/20/2022 -  Chemotherapy   Patient is on Treatment Plan : UTERINE ENDOMETRIAL Dostarlimab-gxly (500 mg) + carboplatin + Taxotere q21d x 6 cycles / Dostarlimab-gxly (1000 mg) q42d x 6 cycles     04/01/2022 Genetic Testing   Negative genetic testing on the CancerNext-Expanded+RNAinsight panel.  MSH3 VUS identified.  The report date is April 01, 2022.  The CancerNext-Expanded gene panel offered by [facility] and includes sequencing and rearrangement analysis for the following 77 genes: AIP, ALK, APC*, ATM*, AXIN2, BAP1, BARD1, BMPR1A, BRCA1*, BRCA2*, BRIP1*, CDC73, CDH1*, CDK4, CDKN1B, CDKN2A, CHEK2*, CTNNA1, DICER1, FH, FLCN, KIF1B, LZTR1, MAX, MEN1, MET, MLH1*, MSH2*, MSH3, MSH6*, MUTYH*, NF1*, NF2, NTHL1, PALB2*, PHOX2B, PMS2*, POT1, PRKAR1A, PTCH1, PTEN*, RAD51C*, RAD51D*, RB1, RET, SDHA, SDHAF2, SDHB, SDHC, SDHD, SMAD4, SMARCA4, SMARCB1, SMARCE1, STK11, SUFU, TMEM127, TP53*, TSC1, TSC2, and VHL (sequencing and deletion/duplication); EGFR, EGLN1, HOXB13, KIT, MITF, PDGFRA, POLD1, and POLE (sequencing only); EPCAM and GREM1 (deletion/duplication only). DNA and RNA analyses performed for * genes.    04/01/2022 Procedure   Right:  No evidence of superficial vein thrombosis in the upper extremity.  Findings  consistent with acute deep vein thrombosis involving the right internal jugular vein, right subclavian vein and right brachial veins.    Left:  No evidence of thrombosis in the subclavian.    07/21/2022 Imaging   CT CHEST ABDOMEN PELVIS W CONTRAST  Result Date: 07/20/2022 CLINICAL DATA:  Endometrial cancer. Evaluate response to chemotherapy. * Tracking Code: BO * EXAM: CT CHEST, ABDOMEN, AND PELVIS WITH CONTRAST TECHNIQUE: Multidetector CT imaging of the chest, abdomen and pelvis was performed following the standard protocol during bolus administration of intravenous contrast. RADIATION DOSE REDUCTION: This exam was performed according to the departmental  dose-optimization program which includes automated exposure control, adjustment of the mA and/or kV according to patient size and/or use of iterative reconstruction technique. CONTRAST:  OMNIPAQUE IOHEXOL 300 MG/ML  SOLN COMPARISON:  02/06/2022. Chest CT 01/23/2022. Abdominopelvic CT 01/13/2022. FINDINGS: CT CHEST FINDINGS Cardiovascular: Right Port-A-Cath tip high right atrium. Aortic atherosclerosis. Mild cardiomegaly, without pericardial effusion. No central pulmonary embolism, on this non-dedicated study. Mediastinum/Nodes: No supraclavicular adenopathy. Node within the azygoesophageal recess measures 1.1 cm today versus 1.3 cm on the prior, upper normal. No hilar adenopathy. Lungs/Pleura: No pleural fluid. No typical findings of pulmonary metastasis. Primarily similar appearance of right greater than left, upper lobe predominant areas of interstitial thickening, architectural distortion, traction bronchiectasis. Left lower lobe foci of peribronchovascular nodular consolidation are felt to be new or progressive. Example superiorly on 64/4 and more inferiorly and laterally on 84/4. Musculoskeletal: No acute osseous abnormality. CT ABDOMEN PELVIS FINDINGS Hepatobiliary: Nonspecific caudate lobe enlargement. Suspect mild hepatic steatosis. Hepatomegaly at 19.7 cm craniocauda. L no suspicious liver lesion or biliary duct dilatation after cholecystectomy. Pancreas: Normal, without mass or  ductal dilatation. Spleen: Subcentimeter hypoattenuating splenic lesion is similar on the prior and of doubtful clinical significance. Adrenals/Urinary Tract: Normal adrenal glands. Interpolar right renal 1.7 cm lesion measures 24 HU, favoring a minimally complex cyst . In the absence of clinically indicated signs/symptoms require(s) no independent follow-up. No hydronephrosis. Normal urinary bladder. Stomach/Bowel: Normal stomach, without wall thickening. Scattered colonic diverticula. Normal colon and terminal ileum. The  appendix crosses the midline and terminates in the central upper pelvis. Normal small bowel. Vascular/Lymphatic: Aortic atherosclerosis. Resolved abdominal adenopathy. Resolved pelvic adenopathy. Residual nodes at the site of right external iliac adenopathy measure 1-2 mm today. Reproductive: Interval hysterectomy.  No adnexal mass. Other: No significant free fluid. No evidence of omental or peritoneal disease. Musculoskeletal: Transitional S1 vertebral body. IMPRESSION: 1. Complete response to therapy of abdominopelvic nodal metastasis. 2. Interval hysterectomy without recurrent pelvic disease. 3. 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 New/progressive areas of peribronchovascular mild ground-glass in the left lower lobe suggest ongoing infection or inflammation. 4. Incidental findings, including: Aortic Atherosclerosis (ICD10-I70.0). Hepatomegaly Electronically Signed   By: Jeronimo Greaves M.D.   On: 07/20/2022 16:58        PHYSICAL EXAMINATION: ECOG PERFORMANCE STATUS: 0 - Asymptomatic  Vitals:   09/04/22 0853  BP: 131/83  Pulse: 85  Resp: 18  Temp: 98.6 F (37 C)  SpO2: 96%   Filed Weights   09/04/22 0853  Weight: 228 lb 3.2 oz (103.5 kg)    GENERAL:alert, no distress and comfortable NEURO: alert & oriented x 3 with fluent speech, no focal motor/sensory deficits  LABORATORY DATA:  I have reviewed the data as listed    Component Value Date/Time   NA 137 09/04/2022 0823   K 3.8 09/04/2022 0823   CL 104 09/04/2022 0823   CO2 27 09/04/2022 0823   GLUCOSE 151 (H) 09/04/2022 0823   BUN 12 09/04/2022 0823   CREATININE 0.49 09/04/2022 0823   CALCIUM 9.0 09/04/2022 0823   PROT 7.4 09/04/2022 0823   ALBUMIN 3.8 09/04/2022 0823   AST 43 (H) 09/04/2022 0823   ALT 51 (H) 09/04/2022 0823   ALKPHOS 49 09/04/2022 0823   BILITOT 0.3 09/04/2022 0823   GFRNONAA >60 09/04/2022 0823   GFRAA >90 12/13/2011 2039    No results found  for: "SPEP", "UPEP"  Lab Results  Component Value Date   WBC 7.7 09/04/2022   NEUTROABS 4.1 09/04/2022   HGB 11.4 (L) 09/04/2022   HCT 35.8 (L) 09/04/2022   MCV 93.2 09/04/2022   PLT 262 09/04/2022      Chemistry      Component Value Date/Time   NA 137 09/04/2022 0823   K 3.8 09/04/2022 0823   CL 104 09/04/2022 0823   CO2 27 09/04/2022 0823   BUN 12 09/04/2022 0823   CREATININE 0.49 09/04/2022 0823      Component Value Date/Time   CALCIUM 9.0 09/04/2022 0823   ALKPHOS 49 09/04/2022 0823   AST 43 (H) 09/04/2022 0823   ALT 51 (H) 09/04/2022 0823   BILITOT 0.3 09/04/2022 1610

## 2022-09-05 LAB — T4: T4, Total: 8.6 ug/dL (ref 4.5–12.0)

## 2022-09-09 ENCOUNTER — Other Ambulatory Visit: Payer: Self-pay

## 2022-09-28 ENCOUNTER — Other Ambulatory Visit (HOSPITAL_COMMUNITY): Payer: Self-pay

## 2022-09-28 ENCOUNTER — Encounter: Payer: Self-pay | Admitting: Hematology and Oncology

## 2022-10-08 ENCOUNTER — Other Ambulatory Visit: Payer: Self-pay | Admitting: *Deleted

## 2022-10-08 DIAGNOSIS — D509 Iron deficiency anemia, unspecified: Secondary | ICD-10-CM

## 2022-10-08 DIAGNOSIS — C541 Malignant neoplasm of endometrium: Secondary | ICD-10-CM

## 2022-10-09 ENCOUNTER — Other Ambulatory Visit: Payer: Self-pay | Admitting: Nurse Practitioner

## 2022-10-09 ENCOUNTER — Ambulatory Visit (HOSPITAL_COMMUNITY)
Admission: RE | Admit: 2022-10-09 | Discharge: 2022-10-09 | Disposition: A | Discharge status: Home / Self Care | Payer: No Typology Code available for payment source | Source: Ambulatory Visit | Attending: Hematology and Oncology | Admitting: Hematology and Oncology

## 2022-10-09 ENCOUNTER — Inpatient Hospital Stay: Payer: No Typology Code available for payment source | Attending: Psychiatry

## 2022-10-09 DIAGNOSIS — Z452 Encounter for adjustment and management of vascular access device: Secondary | ICD-10-CM | POA: Insufficient documentation

## 2022-10-09 DIAGNOSIS — Z1212 Encounter for screening for malignant neoplasm of rectum: Secondary | ICD-10-CM

## 2022-10-09 DIAGNOSIS — Z1211 Encounter for screening for malignant neoplasm of colon: Secondary | ICD-10-CM

## 2022-10-09 DIAGNOSIS — C541 Malignant neoplasm of endometrium: Secondary | ICD-10-CM | POA: Insufficient documentation

## 2022-10-09 LAB — CMP (CANCER CENTER ONLY)
ALT: 36 U/L (ref 0–44)
AST: 25 U/L (ref 15–41)
Albumin: 4 g/dL (ref 3.5–5.0)
Alkaline Phosphatase: 70 U/L (ref 38–126)
Anion gap: 10 (ref 5–15)
BUN: 11 mg/dL (ref 6–20)
CO2: 27 mmol/L (ref 22–32)
Calcium: 9.9 mg/dL (ref 8.9–10.3)
Chloride: 102 mmol/L (ref 98–111)
Creatinine: 0.83 mg/dL (ref 0.44–1.00)
GFR, Estimated: >60 mL/min (ref >60)
Glucose, Bld: 212 mg/dL — ABNORMAL HIGH (ref 70–99)
Potassium: 3.9 mmol/L (ref 3.5–5.1)
Sodium: 139 mmol/L (ref 135–145)
Total Bilirubin: 0.4 mg/dL (ref 0.3–1.2)
Total Protein: 8 g/dL (ref 6.5–8.1)

## 2022-10-09 MED ORDER — HEPARIN SOD (PORK) LOCK FLUSH 100 UNIT/ML IV SOLN
500.0000 [IU] | Freq: Once | INTRAVENOUS | Status: AC
  Administered 2022-10-09: 500 [IU] via INTRAVENOUS

## 2022-10-09 MED ORDER — SODIUM CHLORIDE 0.9% FLUSH
10.0000 mL | Freq: Once | INTRAVENOUS | Status: AC
  Administered 2022-10-09: 10 mL

## 2022-10-09 MED ORDER — IOHEXOL 300 MG/ML  SOLN
100.0000 mL | Freq: Once | INTRAMUSCULAR | Status: AC | PRN
  Administered 2022-10-09: 100 mL via INTRAVENOUS

## 2022-10-15 ENCOUNTER — Telehealth: Payer: Self-pay | Admitting: Hematology and Oncology

## 2022-10-15 NOTE — Telephone Encounter (Signed)
Patient is aware of scheduled appointment times/dates

## 2022-10-16 ENCOUNTER — Other Ambulatory Visit: Payer: No Typology Code available for payment source

## 2022-10-16 ENCOUNTER — Inpatient Hospital Stay: Payer: No Typology Code available for payment source

## 2022-10-16 ENCOUNTER — Inpatient Hospital Stay: Payer: No Typology Code available for payment source | Admitting: Hematology and Oncology

## 2022-10-16 ENCOUNTER — Encounter: Payer: Self-pay | Admitting: Hematology and Oncology

## 2022-10-26 ENCOUNTER — Encounter: Payer: Self-pay | Admitting: Hematology and Oncology

## 2022-10-26 ENCOUNTER — Other Ambulatory Visit: Payer: Self-pay | Admitting: Hematology and Oncology

## 2022-11-04 ENCOUNTER — Encounter: Payer: Self-pay | Admitting: Hematology and Oncology

## 2022-11-04 ENCOUNTER — Other Ambulatory Visit (HOSPITAL_COMMUNITY): Payer: Self-pay

## 2022-11-05 ENCOUNTER — Inpatient Hospital Stay: Payer: No Typology Code available for payment source

## 2022-11-05 ENCOUNTER — Encounter: Payer: Self-pay | Admitting: Hematology and Oncology

## 2022-11-05 ENCOUNTER — Inpatient Hospital Stay: Payer: No Typology Code available for payment source | Admitting: Hematology and Oncology

## 2022-11-05 ENCOUNTER — Other Ambulatory Visit (HOSPITAL_COMMUNITY): Payer: Self-pay

## 2022-11-05 ENCOUNTER — Other Ambulatory Visit: Payer: Self-pay

## 2022-11-05 VITALS — BP 127/82 | HR 91 | Temp 98.9°F | Resp 18 | Ht 60.0 in | Wt 228.4 lb

## 2022-11-05 DIAGNOSIS — R918 Other nonspecific abnormal finding of lung field: Secondary | ICD-10-CM

## 2022-11-05 DIAGNOSIS — E119 Type 2 diabetes mellitus without complications: Secondary | ICD-10-CM

## 2022-11-05 DIAGNOSIS — I82C11 Acute embolism and thrombosis of right internal jugular vein: Secondary | ICD-10-CM

## 2022-11-05 DIAGNOSIS — C541 Malignant neoplasm of endometrium: Secondary | ICD-10-CM

## 2022-11-05 LAB — CBC WITH DIFFERENTIAL (CANCER CENTER ONLY)
Abs Immature Granulocytes: 0.04 10*3/uL (ref 0.00–0.07)
Basophils Absolute: 0.1 10*3/uL (ref 0.0–0.1)
Basophils Relative: 0 %
Eosinophils Absolute: 0.3 10*3/uL (ref 0.0–0.5)
Eosinophils Relative: 2 %
HCT: 40 % (ref 36.0–46.0)
Hemoglobin: 13.2 g/dL (ref 12.0–15.0)
Immature Granulocytes: 0 %
Lymphocytes Relative: 21 %
Lymphs Abs: 2.5 10*3/uL (ref 0.7–4.0)
MCH: 29.1 pg (ref 26.0–34.0)
MCHC: 33 g/dL (ref 30.0–36.0)
MCV: 88.1 fL (ref 80.0–100.0)
Monocytes Absolute: 0.6 10*3/uL (ref 0.1–1.0)
Monocytes Relative: 5 %
Neutro Abs: 8.4 10*3/uL — ABNORMAL HIGH (ref 1.7–7.7)
Neutrophils Relative %: 72 %
Platelet Count: 283 10*3/uL (ref 150–400)
RBC: 4.54 MIL/uL (ref 3.87–5.11)
RDW: 13.8 % (ref 11.5–15.5)
WBC Count: 11.8 10*3/uL — ABNORMAL HIGH (ref 4.0–10.5)
nRBC: 0 % (ref 0.0–0.2)

## 2022-11-05 LAB — CMP (CANCER CENTER ONLY)
ALT: 36 U/L (ref 0–44)
AST: 36 U/L (ref 15–41)
Albumin: 4 g/dL (ref 3.5–5.0)
Alkaline Phosphatase: 78 U/L (ref 38–126)
Anion gap: 7 (ref 5–15)
BUN: 9 mg/dL (ref 6–20)
CO2: 27 mmol/L (ref 22–32)
Calcium: 9.6 mg/dL (ref 8.9–10.3)
Chloride: 101 mmol/L (ref 98–111)
Creatinine: 0.49 mg/dL (ref 0.44–1.00)
GFR, Estimated: >60 mL/min (ref >60)
Glucose, Bld: 175 mg/dL — ABNORMAL HIGH (ref 70–99)
Potassium: 3.9 mmol/L (ref 3.5–5.1)
Sodium: 135 mmol/L (ref 135–145)
Total Bilirubin: 0.5 mg/dL (ref 0.3–1.2)
Total Protein: 8.2 g/dL — ABNORMAL HIGH (ref 6.5–8.1)

## 2022-11-05 LAB — TSH: TSH: 1.095 u[IU]/mL (ref 0.350–4.500)

## 2022-11-05 MED ORDER — HEPARIN SOD (PORK) LOCK FLUSH 100 UNIT/ML IV SOLN
500.0000 [IU] | Freq: Once | INTRAVENOUS | Status: AC
  Administered 2022-11-05: 500 [IU]

## 2022-11-05 MED ORDER — SODIUM CHLORIDE 0.9% FLUSH
10.0000 mL | Freq: Once | INTRAVENOUS | Status: AC
  Administered 2022-11-05: 10 mL

## 2022-11-05 NOTE — Progress Notes (Signed)
[facility] OFFICE PROGRESS NOTE  Patient Care Team: Donell Beers, FNP as PCP - General (Nurse Practitioner)  ASSESSMENT & PLAN:  Endometrial cancer [facility]) She missed her treatment recently I reviewed CT imaging which show stable disease The lung changes are likely due to previous infection She is not symptomatic now I plan to repeat imaging study again in February  Acute embolism from right internal jugular vein (HCC) She is doing well on anticoagulation therapy Recommend she continues the same  Diabetes mellitus type 2, uncomplicated (HCC) Her blood sugar is improving with discontinuation of chemotherapy  She will continue medical management  Lung infiltrate on CT Plan to repeat imaging study again in February  No orders of the defined types were placed in this encounter.   All questions were answered. The patient knows to call the clinic with any problems, questions or concerns. The total time spent in the appointment was 30 minutes encounter with patients including review of chart and various tests results, discussions about plan of care and coordination of care plan   Artis Delay, MD 11/05/2022 3:19 PM  INTERVAL HISTORY: Please see below for problem oriented charting. she returns for treatment and follow-up She missed her appointment recently We discussed CT imaging results She fell approximately a month ago but denies major injuries  REVIEW OF SYSTEMS:   Constitutional: Denies fevers, chills or abnormal weight loss Eyes: Denies blurriness of vision Ears, nose, mouth, throat, and face: Denies mucositis or sore throat Respiratory: Denies cough, dyspnea or wheezes Cardiovascular: Denies palpitation, chest discomfort or lower extremity swelling Gastrointestinal:  Denies nausea, heartburn or change in bowel habits Skin: Denies abnormal skin rashes Lymphatics: Denies new lymphadenopathy or easy bruising Neurological:Denies numbness, tingling or new  weaknesses Behavioral/Psych: Mood is stable, no new changes  All other systems were reviewed with the patient and are negative.  I have reviewed the past medical history, past surgical history, social history and family history with the patient and they are unchanged from previous note.  ALLERGIES:  is allergic to paclitaxel and advil [ibuprofen].  MEDICATIONS:  Current Outpatient Medications  Medication Sig Dispense Refill   apixaban (ELIQUIS) 5 MG TABS tablet Take 1 tablet (5 mg total) by mouth 2 (two) times daily. 180 tablet 3   atorvastatin (LIPITOR) 20 MG tablet Take 1 tablet (20 mg total) by mouth daily. 90 tablet 1   Blood Glucose Monitoring Suppl DEVI 1 each by Does not apply route in the morning, at noon, and at bedtime. May substitute to any manufacturer covered by patient's insurance. 1 each 0   lidocaine-prilocaine (EMLA) cream Apply to affected area once 30 g 3   metFORMIN (GLUCOPHAGE) 500 MG tablet Take 2 tablets(1000 mg) by mouth in the morning with breakfast and 1 tablet (500mg )  with dinner 60 tablet 4   ondansetron (ZOFRAN) 8 MG tablet Take 1 tablet (8 mg total) by mouth every 8 (eight) hours as needed for nausea or vomiting. Start on the third day after chemotherapy. 30 tablet 1   prochlorperazine (COMPAZINE) 10 MG tablet Take 1 tablet (10 mg total) by mouth every 6 (six) hours as needed for nausea or vomiting. 30 tablet 1   No current facility-administered medications for this visit.    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 Endometrial thickening  possibly due to obstruction from the cystic mass. Direct visualization is recommended.   Enlarged right para-aortic and right external iliac nodes are indeterminate but concerning for metastasis  given cervical lesion.   Borderline enlargement and fecalization of the distal ileum. No definite transition point. Findings suggestive of slow transit/constipation.   01/14/2022 Initial Biopsy   Endometrial biopsy and ECC: A. ENDOCERVIX, CURETTAGE: - BENIGN ENDOCERVICAL MUCOSA WITH ACUTE AND CHRONIC INFLAMMATION - NEGATIVE FOR MALIGNANCY  B. 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  The tumor cells are partially positive for p16. The p53 stain shows foci with wild-type staining and foci with overexpression.  The tumor cells show patchy positivity for ER and PR and are negative for Napsin A.  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  Drs. Arthur Holms reviewed the case and agree with the above diagnosis.  Clinical correlation recommended.    01/14/2022 Initial Diagnosis   Endometrial cancer (HCC)   01/24/2022 Imaging   MR pelvis 1. There is an enhancing mass identified within the lower uterine segment which measures 4.3 x 3.9 by 4.7 cm. This invades the anterior myometrium within the lower uterine segment. This appears to terminate at the level of the internal os of the cervix. Findings are compatible with endometrial carcinoma. 2. Aortocaval, right common iliac, and bilateral pelvic sidewall lymph nodes are enlarged compatible with metastatic adenopathy. Given the presence of periaortic and pelvic node involvement imaging findings are compatible with stage IIIC2 disease.   02/06/2022 PET scan   1. Diffusely increased uptake within the endometrium and cervix compatible with known endometrial carcinoma. 2. Tracer avid retroperitoneal and pelvic lymph nodes compatible with nodal metastasis. 3. No signs  of solid organ metastasis or distant metastatic disease. 4. Nonspecific, diffuse increased bone marrow uptake is noted within the axial and appendicular skeleton. No focal areas of increased uptake to suggest osseous metastasis. 5. Multifocal bilateral areas of fibrosis, architectural distortion and scarring within both lungs which is favored to represent a inflammatory or infectious process. Findings may be the sequelae of prior atypical viral infection.   02/10/2022 Pathology Results   A. RIGHT AORTOCAVAL LYMPH NODE, EXCISION: Two benign lymph nodes, negative for carcinoma (0/2)  B. RIGHT PELVIC LYMPH NODE, EXCISION: Two of two lymph nodes with metastatic adenocarcinoma (2/2)  C. LEFT OBTURATOR PELVIC LYMPH NODE, EXCISION: Three lymph nodes, negative for carcinoma (0/3)  D. 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 RIGHT POSTERIOR CERVICAL MARGIN, EXCISION: Benign cervical stroma Negative for carcinoma  ONCOLOGY TABLE:  UTERUS, CARCINOMA OR CARCINOSARCOMA: Resection  Procedure: Total hysterectomy and bilateral salpingo-oophorectomy with node sampling Histologic Type: Endometrioid adenocarcinoma Histologic Grade: High-grade, FIGO 3 Myometrial Invasion:      Depth of Myometrial Invasion (mm): 10 mm      Myometrial Thickness (mm):  33 mm      Percentage of Myometrial Invasion: 30% Uterine Serosa  Involvement: Not identified Cervical stromal Involvement: Not identified Extent of involvement of other tissue/organs: Not identified Peritoneal/Ascitic Fluid: []  Lymphovascular Invasion: Present Regional Lymph Nodes:      Pelvic Lymph Nodes Examined: 5 nonsentinel lymph nodes      Pelvic Lymph Nodes with Metastasis: 2          Macrometastasis: (>2.0 mm): 2          Micrometastasis: (>0.2 mm and < 2.0 mm): 0          Isolated Tumor Cells (<0.2 mm): 0          Laterality of Lymph Node with Tumor: Right          Extracapsular Extension: Not identified      Para-aortic Lymph Nodes Examined: 2 nonsentinel lymph nodes       Para-aortic Lymph Nodes with Metastasis: 0          Macrometastasis: (>2.0 mm): 0          Micrometastasis: (>0.2 mm and < 2.0 mm): 0          Isolated Tumor Cells (<0.2 mm): 0 Distant Metastasis: Not applicable Pathologic Stage Classification (pTNM, AJCC 8th Edition): pT1a, pN1a  Ancillary Studies: MMR testing has been ordered and the results will be issued within an addendum to this report.    02/10/2022 Surgery   Preoperative Diagnosis: Endometrioid endometrial cancer FIGO grade 2, Morbid obesity (BMI 44)    Procedures: Robotic-assisted total laparoscopic hysterectomy, bilateral salpingo-oophorectomy, bilateral sentinel lymph node injection and debulking of enlarge, PET avid pelvic and para-aortic lymph nodes (Modifer 22: extreme morbid obesity, BMI 44, with significant retroperitoneal and intraperitoneal adiposity requiring additional OR personnel for positioning and retraction. Obesity made retroperitoneal visualization limited, increasing the complexity of the case and necessitating additional instrumentation for retraction and to create safe exposure. Obesity related complexity increased the duration of the procedure by 60 minutes.)   Surgeon: Clide Cliff, MD    Findings: Normal upper abdominal survey with normal liver surface and diaphragm. Normal appearing  small and large bowel. Globally enlarged uterus. Overall normal tubes, and ovaries, small paratubal cyst. No evidence of peritoneal disease, ascites, or carcinomatosis. Enlarged bilateral pelvic and right para-aortic lymph nodes removed. Tumor spill into the vagina and pelvis noted with colpotomy. Pelvis and vagina copiously irrigated. Significant intra-abdominal adiposity.   02/23/2022 Cancer Staging   Staging form: Corpus Uteri - Carcinoma and Carcinosarcoma, AJCC 8th Edition - Pathologic stage from 02/23/2022: Stage IIIC1 (pT1a, pN1, cM0) - Signed by Artis Delay, MD on 02/23/2022 Stage prefix: Initial diagnosis   03/05/2022 Procedure   Status post right IJ port catheter placement.    03/20/2022 -  Chemotherapy   Patient is on Treatment Plan : UTERINE ENDOMETRIAL Dostarlimab-gxly (500 mg) + carboplatin + Taxotere q21d x 6 cycles / Dostarlimab-gxly (1000 mg) q42d x 6 cycles     04/01/2022 Genetic Testing   Negative genetic testing on the CancerNext-Expanded+RNAinsight panel.  MSH3 VUS identified.  The report date is April 01, 2022.  The CancerNext-Expanded gene panel offered by [facility] and includes sequencing and rearrangement analysis for the following 77 genes: AIP, ALK, APC*, ATM*, AXIN2, BAP1, BARD1, BMPR1A, BRCA1*, BRCA2*, BRIP1*, CDC73, CDH1*, CDK4, CDKN1B, CDKN2A, CHEK2*, CTNNA1, DICER1, FH, FLCN, KIF1B, LZTR1, MAX, MEN1, MET, MLH1*, MSH2*, MSH3, MSH6*, MUTYH*, NF1*, NF2, NTHL1, PALB2*, PHOX2B, PMS2*, POT1, PRKAR1A, PTCH1, PTEN*, RAD51C*, RAD51D*, RB1, RET, SDHA, SDHAF2, SDHB, SDHC,  SDHD, SMAD4, SMARCA4, SMARCB1, SMARCE1, STK11, SUFU, TMEM127, TP53*, TSC1, TSC2, and VHL (sequencing and deletion/duplication); EGFR, EGLN1, HOXB13, KIT, MITF, PDGFRA, POLD1, and POLE (sequencing only); EPCAM and GREM1 (deletion/duplication only). DNA and RNA analyses performed for * genes.    04/01/2022 Procedure   Right:  No evidence of superficial vein thrombosis in the upper extremity.  Findings   consistent with acute deep vein thrombosis involving the right internal jugular vein, right subclavian vein and right brachial veins.    Left:  No evidence of thrombosis in the subclavian.    07/21/2022 Imaging   CT CHEST ABDOMEN PELVIS W CONTRAST  Result Date: 07/20/2022 CLINICAL DATA:  Endometrial cancer. Evaluate response to chemotherapy. * Tracking Code: BO * EXAM: CT CHEST, ABDOMEN, AND PELVIS WITH CONTRAST TECHNIQUE: Multidetector CT imaging of the chest, abdomen and pelvis was performed following the standard protocol during bolus administration of intravenous contrast. RADIATION DOSE REDUCTION: This exam was performed according to the departmental dose-optimization program which includes automated exposure control, adjustment of the mA and/or kV according to patient size and/or use of iterative reconstruction technique. CONTRAST:  OMNIPAQUE IOHEXOL 300 MG/ML  SOLN COMPARISON:  02/06/2022. Chest CT 01/23/2022. Abdominopelvic CT 01/13/2022. FINDINGS: CT CHEST FINDINGS Cardiovascular: Right Port-A-Cath tip high right atrium. Aortic atherosclerosis. Mild cardiomegaly, without pericardial effusion. No central pulmonary embolism, on this non-dedicated study. Mediastinum/Nodes: No supraclavicular adenopathy. Node within the azygoesophageal recess measures 1.1 cm today versus 1.3 cm on the prior, upper normal. No hilar adenopathy. Lungs/Pleura: No pleural fluid. No typical findings of pulmonary metastasis. Primarily similar appearance of right greater than left, upper lobe predominant areas of interstitial thickening, architectural distortion, traction bronchiectasis. Left lower lobe foci of peribronchovascular nodular consolidation are felt to be new or progressive. Example superiorly on 64/4 and more inferiorly and laterally on 84/4. Musculoskeletal: No acute osseous abnormality. CT ABDOMEN PELVIS FINDINGS Hepatobiliary: Nonspecific caudate lobe enlargement. Suspect mild hepatic steatosis.  Hepatomegaly at 19.7 cm craniocauda. L no suspicious liver lesion or biliary duct dilatation after cholecystectomy. Pancreas: Normal, without mass or ductal dilatation. Spleen: Subcentimeter hypoattenuating splenic lesion is similar on the prior and of doubtful clinical significance. Adrenals/Urinary Tract: Normal adrenal glands. Interpolar right renal 1.7 cm lesion measures 24 HU, favoring a minimally complex cyst . In the absence of clinically indicated signs/symptoms require(s) no independent follow-up. No hydronephrosis. Normal urinary bladder. Stomach/Bowel: Normal stomach, without wall thickening. Scattered colonic diverticula. Normal colon and terminal ileum. The appendix crosses the midline and terminates in the central upper pelvis. Normal small bowel. Vascular/Lymphatic: Aortic atherosclerosis. Resolved abdominal adenopathy. Resolved pelvic adenopathy. Residual nodes at the site of right external iliac adenopathy measure 1-2 mm today. Reproductive: Interval hysterectomy.  No adnexal mass. Other: No significant free fluid. No evidence of omental or peritoneal disease. Musculoskeletal: Transitional S1 vertebral body. IMPRESSION: 1. Complete response to therapy of abdominopelvic nodal metastasis. 2. Interval hysterectomy without recurrent pelvic disease. 3. 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 New/progressive areas of peribronchovascular mild ground-glass in the left lower lobe suggest ongoing infection or inflammation. 4. Incidental findings, including: Aortic Atherosclerosis (ICD10-I70.0). Hepatomegaly Electronically Signed   By: Jeronimo Greaves M.D.   On: 07/20/2022 16:58      10/09/2022 Imaging   CT CHEST ABDOMEN PELVIS W CONTRAST  Result Date: 10/09/2022 CLINICAL DATA:  History of endometrial cancer, high-risk. Monitor. * Tracking Code: BO * EXAM: CT CHEST, ABDOMEN, AND PELVIS WITH CONTRAST TECHNIQUE: Multidetector CT imaging of the chest,  abdomen and pelvis  was performed following the standard protocol during bolus administration of intravenous contrast. RADIATION DOSE REDUCTION: This exam was performed according to the departmental dose-optimization program which includes automated exposure control, adjustment of the mA and/or kV according to patient size and/or use of iterative reconstruction technique. CONTRAST:  OMNIPAQUE IOHEXOL 300 MG/ML  SOLN COMPARISON:  Multiple priors including most recent CT July 20, 2022 FINDINGS: CT CHEST FINDINGS Cardiovascular: Accessed right chest Port-A-Cath with tip in the right atrium. Normal caliber thoracic aorta. No central pulmonary embolus on this nondedicated study. Normal size heart. No significant pericardial effusion/thickening. Mediastinum/Nodes: Azygous esophageal recess lymph node measures 10 mm on image 24/2 previously 11 mm. Right hilar lymph node measures 11 mm on series 19/2 unchanged. Esophagus is grossly unremarkable. No suspicious pulmonary nodules. Lungs/Pleura: Similar appearance of the right-greater-than-left upper lobe predominant interstitial thickening, architectural distortion and traction bronchiectasis. Left lower lobe foci of irregular peribronchovascular nodular consolidation progressive on image 89/5 but similar in other areas for instance on image 82/5 and 63/5. Musculoskeletal: No aggressive lytic or blastic lesion of bone. CT ABDOMEN PELVIS FINDINGS Hepatobiliary: No suspicious hepatic lesion. Gallbladder surgically absent. No biliary ductal dilation. Pancreas: No pancreatic ductal dilation or evidence of acute inflammation. Spleen: No splenomegaly. Adrenals/Urinary Tract: Bilateral adrenal glands appear normal. No hydronephrosis. Kidneys demonstrate symmetric enhancement. Stable minimally complex 18 mm right interpolar renal lesion. Urinary bladder is normal. Stomach/Bowel: No radiopaque enteric contrast material was administered. Stomach is unremarkable for degree of  distension. No pathologic dilation of small or large bowel. Normal appendix. Colonic diverticulosis without findings of acute diverticulitis. No evidence of acute bowel inflammation. Vascular/Lymphatic: Normal caliber abdominal aorta. Smooth IVC contours. The portal, splenic and superior mesenteric veins are patent. No pathologically enlarged abdominal or pelvic lymph nodes. Reproductive: Status post hysterectomy common no suspicious nodularity along the vaginal cuff. No adnexal masses. Other: No significant abdominopelvic free fluid. No discrete peritoneal or omental nodularity. Musculoskeletal: Transitional S1 vertebral anatomy. No aggressive lytic or blastic lesion of bone. Similar changes of osteitis pubis. IMPRESSION: 1. No convincing evidence of metastatic disease in the chest, abdomen or pelvis. 2. Similar appearance of the right-greater-than-left upper lobe predominant interstitial thickening, architectural distortion and traction bronchiectasis, likely sequela of prior infection. 3. Left lower lobe foci of irregular peribronchovascular nodular consolidation progressive but similar in other areas, favored to reflect an infectious/inflammatory process. Recommend attention on follow-up. 4. Stable minimally complex 18 mm right interpolar renal lesion, recommend continued attention on follow-up. Electronically Signed   By: Maudry Mayhew M.D.   On: 10/09/2022 16:31        PHYSICAL EXAMINATION: ECOG PERFORMANCE STATUS: 1 - Symptomatic but completely ambulatory  Vitals:   11/05/22 1326  BP: 127/82  Pulse: 91  Resp: 18  Temp: 98.9 F (37.2 C)  SpO2: 94%   Filed Weights   11/05/22 1326  Weight: 228 lb 6.4 oz (103.6 kg)    GENERAL:alert, no distress and comfortable  LABORATORY DATA:  I have reviewed the data as listed    Component Value Date/Time   NA 135 11/05/2022 1254   K 3.9 11/05/2022 1254   CL 101 11/05/2022 1254   CO2 27 11/05/2022 1254   GLUCOSE 175 (H) 11/05/2022 1254   BUN 9  11/05/2022 1254   CREATININE 0.49 11/05/2022 1254   CALCIUM 9.6 11/05/2022 1254   PROT 8.2 (H) 11/05/2022 1254   ALBUMIN 4.0 11/05/2022 1254   AST 36 11/05/2022 1254   ALT 36 11/05/2022 1254   ALKPHOS 78  11/05/2022 1254   BILITOT 0.5 11/05/2022 1254   GFRNONAA >60 11/05/2022 1254   GFRAA >90 12/13/2011 2039    No results found for: "SPEP", "UPEP"  Lab Results  Component Value Date   WBC 11.8 (H) 11/05/2022   NEUTROABS 8.4 (H) 11/05/2022   HGB 13.2 11/05/2022   HCT 40.0 11/05/2022   MCV 88.1 11/05/2022   PLT 283 11/05/2022      Chemistry      Component Value Date/Time   NA 135 11/05/2022 1254   K 3.9 11/05/2022 1254   CL 101 11/05/2022 1254   CO2 27 11/05/2022 1254   BUN 9 11/05/2022 1254   CREATININE 0.49 11/05/2022 1254      Component Value Date/Time   CALCIUM 9.6 11/05/2022 1254   ALKPHOS 78 11/05/2022 1254   AST 36 11/05/2022 1254   ALT 36 11/05/2022 1254   BILITOT 0.5 11/05/2022 1254       RADIOGRAPHIC STUDIES: I have personally reviewed the radiological images as listed and agreed with the findings in the report. CT CHEST ABDOMEN PELVIS W CONTRAST  Result Date: 10/09/2022 CLINICAL DATA:  History of endometrial cancer, high-risk. Monitor. * Tracking Code: BO * EXAM: CT CHEST, ABDOMEN, AND PELVIS WITH CONTRAST TECHNIQUE: Multidetector CT imaging of the chest, abdomen and pelvis was performed following the standard protocol during bolus administration of intravenous contrast. RADIATION DOSE REDUCTION: This exam was performed according to the departmental dose-optimization program which includes automated exposure control, adjustment of the mA and/or kV according to patient size and/or use of iterative reconstruction technique. CONTRAST:  OMNIPAQUE IOHEXOL 300 MG/ML  SOLN COMPARISON:  Multiple priors including most recent CT July 20, 2022 FINDINGS: CT CHEST FINDINGS Cardiovascular: Accessed right chest Port-A-Cath with tip in the right atrium. Normal caliber  thoracic aorta. No central pulmonary embolus on this nondedicated study. Normal size heart. No significant pericardial effusion/thickening. Mediastinum/Nodes: Azygous esophageal recess lymph node measures 10 mm on image 24/2 previously 11 mm. Right hilar lymph node measures 11 mm on series 19/2 unchanged. Esophagus is grossly unremarkable. No suspicious pulmonary nodules. Lungs/Pleura: Similar appearance of the right-greater-than-left upper lobe predominant interstitial thickening, architectural distortion and traction bronchiectasis. Left lower lobe foci of irregular peribronchovascular nodular consolidation progressive on image 89/5 but similar in other areas for instance on image 82/5 and 63/5. Musculoskeletal: No aggressive lytic or blastic lesion of bone. CT ABDOMEN PELVIS FINDINGS Hepatobiliary: No suspicious hepatic lesion. Gallbladder surgically absent. No biliary ductal dilation. Pancreas: No pancreatic ductal dilation or evidence of acute inflammation. Spleen: No splenomegaly. Adrenals/Urinary Tract: Bilateral adrenal glands appear normal. No hydronephrosis. Kidneys demonstrate symmetric enhancement. Stable minimally complex 18 mm right interpolar renal lesion. Urinary bladder is normal. Stomach/Bowel: No radiopaque enteric contrast material was administered. Stomach is unremarkable for degree of distension. No pathologic dilation of small or large bowel. Normal appendix. Colonic diverticulosis without findings of acute diverticulitis. No evidence of acute bowel inflammation. Vascular/Lymphatic: Normal caliber abdominal aorta. Smooth IVC contours. The portal, splenic and superior mesenteric veins are patent. No pathologically enlarged abdominal or pelvic lymph nodes. Reproductive: Status post hysterectomy common no suspicious nodularity along the vaginal cuff. No adnexal masses. Other: No significant abdominopelvic free fluid. No discrete peritoneal or omental nodularity. Musculoskeletal: Transitional S1  vertebral anatomy. No aggressive lytic or blastic lesion of bone. Similar changes of osteitis pubis. IMPRESSION: 1. No convincing evidence of metastatic disease in the chest, abdomen or pelvis. 2. Similar appearance of the right-greater-than-left upper lobe predominant interstitial thickening, architectural distortion and  traction bronchiectasis, likely sequela of prior infection. 3. Left lower lobe foci of irregular peribronchovascular nodular consolidation progressive but similar in other areas, favored to reflect an infectious/inflammatory process. Recommend attention on follow-up. 4. Stable minimally complex 18 mm right interpolar renal lesion, recommend continued attention on follow-up. Electronically Signed   By: Maudry Mayhew M.D.   On: 10/09/2022 16:31

## 2022-11-05 NOTE — Assessment & Plan Note (Signed)
She missed her treatment recently I reviewed CT imaging which show stable disease The lung changes are likely due to previous infection She is not symptomatic now I plan to repeat imaging study again in February

## 2022-11-05 NOTE — Assessment & Plan Note (Signed)
Plan to repeat imaging study again in February

## 2022-11-05 NOTE — Assessment & Plan Note (Signed)
She is doing well on anticoagulation therapy Recommend she continues the same

## 2022-11-05 NOTE — Assessment & Plan Note (Signed)
Her blood sugar is improving with discontinuation of chemotherapy  She will continue medical management

## 2022-11-06 ENCOUNTER — Inpatient Hospital Stay: Payer: No Typology Code available for payment source | Attending: Psychiatry

## 2022-11-06 ENCOUNTER — Encounter: Payer: Self-pay | Admitting: Hematology and Oncology

## 2022-11-06 VITALS — BP 151/90 | HR 99 | Temp 98.9°F | Resp 20

## 2022-11-06 DIAGNOSIS — C541 Malignant neoplasm of endometrium: Secondary | ICD-10-CM

## 2022-11-06 DIAGNOSIS — Z5112 Encounter for antineoplastic immunotherapy: Secondary | ICD-10-CM | POA: Insufficient documentation

## 2022-11-06 LAB — T4: T4, Total: 9.2 ug/dL (ref 4.5–12.0)

## 2022-11-06 MED ORDER — SODIUM CHLORIDE 0.9 % IV SOLN
1000.0000 mg | Freq: Once | INTRAVENOUS | Status: AC
  Administered 2022-11-06: 1000 mg via INTRAVENOUS
  Filled 2022-11-06: qty 20

## 2022-11-06 MED ORDER — HEPARIN SOD (PORK) LOCK FLUSH 100 UNIT/ML IV SOLN
500.0000 [IU] | Freq: Once | INTRAVENOUS | Status: AC | PRN
  Administered 2022-11-06: 500 [IU]

## 2022-11-06 MED ORDER — SODIUM CHLORIDE 0.9% FLUSH
10.0000 mL | INTRAVENOUS | Status: DC | PRN
  Administered 2022-11-06: 10 mL

## 2022-11-06 MED ORDER — SODIUM CHLORIDE 0.9 % IV SOLN
Freq: Once | INTRAVENOUS | Status: AC
Start: 2022-11-06 — End: 2022-11-06

## 2022-11-06 NOTE — Patient Instructions (Signed)
[facility]  Discharge Instructions: Thank you for choosing [facility] to provide your oncology and hematology care.   If you have a lab appointment with the Cancer Center, please go directly to the Cancer Center and check in at the registration area.   Wear comfortable clothing and clothing appropriate for easy access to any Portacath or PICC line.   We strive to give you quality time with your provider. You may need to reschedule your appointment if you arrive late (15 or more minutes).  Arriving late affects you and other patients whose appointments are after yours.  Also, if you miss three or more appointments without notifying the office, you may be dismissed from the clinic at the provider's discretion.      For prescription refill requests, have your pharmacy contact our office and allow 72 hours for refills to be completed.    Today you received the following chemotherapy and/or immunotherapy agents Jemperli To help prevent nausea and vomiting after your treatment, we encourage you to take your nausea medication as directed.  BELOW ARE SYMPTOMS THAT SHOULD BE REPORTED IMMEDIATELY: *FEVER GREATER THAN 100.4 F (38 C) OR HIGHER *CHILLS OR SWEATING *NAUSEA AND VOMITING THAT IS NOT CONTROLLED WITH YOUR NAUSEA MEDICATION *UNUSUAL SHORTNESS OF BREATH *UNUSUAL BRUISING OR BLEEDING *URINARY PROBLEMS (pain or burning when urinating, or frequent urination) *BOWEL PROBLEMS (unusual diarrhea, constipation, pain near the anus) TENDERNESS IN MOUTH AND THROAT WITH OR WITHOUT PRESENCE OF ULCERS (sore throat, sores in mouth, or a toothache) UNUSUAL RASH, SWELLING OR PAIN  UNUSUAL VAGINAL DISCHARGE OR ITCHING   Items with * indicate a potential emergency and should be followed up as soon as possible or go to the Emergency Department if any problems should occur.  Please show the CHEMOTHERAPY ALERT CARD or IMMUNOTHERAPY ALERT CARD at check-in to  the Emergency Department and triage nurse.  Should you have questions after your visit or need to cancel or reschedule your appointment, please contact [facility]  Dept: [phone]  and follow the prompts.  Office hours are 8:00 a.m. to 4:30 p.m. Monday - Friday. Please note that voicemails left after 4:00 p.m. may not be returned until the following business day.  We are closed weekends and major holidays. You have access to a nurse at all times for urgent questions. Please call the main number to the clinic Dept: [phone] and follow the prompts.   For any non-urgent questions, you may also contact your provider using MyChart. We now offer e-Visits for anyone 18 and older to request care online for non-urgent symptoms. For details visit [email or web address].   Also download the MyChart app! Go to the app store, search "MyChart", open the app, select [facility], and log in with your MyChart username and password.  

## 2022-11-08 ENCOUNTER — Other Ambulatory Visit: Payer: Self-pay

## 2022-11-17 ENCOUNTER — Encounter: Payer: Self-pay | Admitting: Hematology and Oncology

## 2022-11-24 ENCOUNTER — Encounter: Payer: Self-pay | Admitting: Hematology and Oncology

## 2022-11-24 ENCOUNTER — Other Ambulatory Visit (HOSPITAL_COMMUNITY): Payer: Self-pay

## 2022-11-24 ENCOUNTER — Encounter: Payer: Self-pay | Admitting: Nurse Practitioner

## 2022-11-24 ENCOUNTER — Ambulatory Visit (INDEPENDENT_AMBULATORY_CARE_PROVIDER_SITE_OTHER): Payer: Self-pay | Admitting: Nurse Practitioner

## 2022-11-24 VITALS — BP 123/76 | HR 98 | Temp 98.6°F | Resp 14 | Ht 60.0 in | Wt 228.0 lb

## 2022-11-24 DIAGNOSIS — W19XXXA Unspecified fall, initial encounter: Secondary | ICD-10-CM | POA: Insufficient documentation

## 2022-11-24 DIAGNOSIS — M549 Dorsalgia, unspecified: Secondary | ICD-10-CM | POA: Insufficient documentation

## 2022-11-24 DIAGNOSIS — E785 Hyperlipidemia, unspecified: Secondary | ICD-10-CM

## 2022-11-24 DIAGNOSIS — E119 Type 2 diabetes mellitus without complications: Secondary | ICD-10-CM

## 2022-11-24 DIAGNOSIS — Z1211 Encounter for screening for malignant neoplasm of colon: Secondary | ICD-10-CM

## 2022-11-24 LAB — POCT GLYCOSYLATED HEMOGLOBIN (HGB A1C): HbA1c, POC (controlled diabetic range): 7.8 % — AB (ref 0.0–7.0)

## 2022-11-24 MED ORDER — METFORMIN HCL 1000 MG PO TABS
1000.0000 mg | ORAL_TABLET | Freq: Two times a day (BID) | ORAL | 3 refills | Status: DC
  Filled 2022-11-24 – 2023-02-02 (×2): qty 180, 90d supply, fill #0

## 2022-11-24 MED ORDER — TRULICITY 0.75 MG/0.5ML ~~LOC~~ SOAJ
0.7500 mg | SUBCUTANEOUS | 0 refills | Status: DC
  Filled 2022-11-24: qty 2, 28d supply, fill #0

## 2022-11-24 NOTE — Progress Notes (Signed)
Established Patient Office Visit  Subjective:  Patient ID: Maureen Campos, female    DOB: 1970/04/26  Age: 52 y.o. MRN: [id number]  CC:  Chief Complaint  Patient presents with   Follow-up    HPI Maureen Campos is a 52 y.o. 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   Patient presents for follow-up for her chronic medical conditions  Type 2 diabetes.  Currently on metformin 1000 mg with breakfast and 500 mg with dinner.  Has a glucometer but does not check her blood sugar regularly, her last random CBG was 172.  Stated that her diet can be better , diet has not been good since her son is now back home, she has also not been exercising.  She denies polyuria polydipsia polyphagia.  Takes atorvastatin 20 mg daily for hyperlipidemia.  Has not had her eye exam done yet due to lack of insurance.  Patient complains of mid back pain that started after she slipped while mopping the floor some weeks ago.  She has been taking Tylenol as needed and using heating pad.   Cologuard test was ordered to screen for colon cancer, stated that she missed the call from the company she has been trying to contact them without success.  She problems calling them again.  She was encouraged to get a flu vaccine, shingles vaccine and Tdap vaccine at the pharmacy.  She has been following up regularly with a hematologist for treatment of her endometrial cancer.   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)    Diabetes mellitus without complication (HCC)    Dyspnea    with activity   Endometrial cancer (HCC)    Family history of colon  cancer 03/04/2022   Family history of uterine cancer    Fast heart beat    with activity   Fatigue    Hearing loss    Left ear   History of kidney stones    12 years ago   Lazy eye, left    Left leg numbness    occ   Migraine    occ    Past Surgical History:  Procedure Laterality Date   CHOLECYSTECTOMY  2011   IR CHEST FLUORO  04/07/2022   IR CV LINE INJECTION  04/09/2022   IR IMAGING GUIDED PORT INSERTION  03/05/2022   IR PORT REPAIR CENTRAL VENOUS ACCESS DEVICE  04/09/2022   IR US GUIDE VASC ACCESS RIGHT  04/09/2022   ROBOTIC ASSISTED TOTAL HYSTERECTOMY WITH BILATERAL SALPINGO OOPHERECTOMY N/A 02/10/2022   Procedure: XI ROBOTIC ASSISTED TOTAL HYSTERECTOMY WITH BILATERAL SALPINGO OOPHORECTOMY;  Surgeon: Clide Cliff, MD;  Location: [facility];  Service: Gynecology;  Laterality: N/A;   ROBOTIC PELVIC AND PARA-AORTIC LYMPH NODE DISSECTION N/A 02/10/2022   Procedure: XI ROBOTIC PELVIC AND PARA-AORTIC SENTINEL LYMPH NODE DISSECTION;  Surgeon: Clide Cliff, MD;  Location: [facility];  Service: Gynecology;  Laterality: N/A;    Family History  Problem Relation Age of Onset   Colon cancer Father 72   Uterine cancer Sister 24   Melanoma Sister 6   Cancer Paternal Aunt        possible brain cancer, dx > 50  Breast cancer Neg Hx    Ovarian cancer Neg Hx    Endometrial cancer Neg Hx    Pancreatic cancer Neg Hx    Prostate cancer Neg Hx     Social History   Socioeconomic History   Marital status: Significant Other    Spouse name: Not on file   Number of children: 4   Years of education: Not on file   Highest education level: Not on file  Occupational History   Not on file  Tobacco Use   Smoking status: Never   Smokeless tobacco: Never  Vaping Use   Vaping status: Never Used  Substance and Sexual Activity   Alcohol use: Not Currently    Comment: every weekend   Drug use: No   Sexual activity: Yes    Birth control/protection: None  Other Topics Concern   Not on file  Social  History Narrative   Lives with her daughter   Social Determinants of Health   Financial Resource Strain: Not on file  Food Insecurity: Food Insecurity Present (07/28/2022)   Hunger Vital Sign    Worried About Running Out of Food in the Last Year: Sometimes true    Ran Out of Food in the Last Year: Sometimes true  Transportation Needs: No Transportation Needs (07/29/2022)   PRAPARE - Administrator, Civil Service (Medical): No    Lack of Transportation (Non-Medical): No  Physical Activity: Not on file  Stress: Not on file  Social Connections: Not on file  Intimate Partner Violence: Not At Risk (07/29/2022)   Humiliation, Afraid, Rape, and Kick questionnaire    Fear of Current or Ex-Partner: No    Emotionally Abused: No    Physically Abused: No    Sexually Abused: No    Outpatient Medications Prior to Visit  Medication Sig Dispense Refill   apixaban (ELIQUIS) 5 MG TABS tablet Take 1 tablet (5 mg total) by mouth 2 (two) times daily. 180 tablet 3   atorvastatin (LIPITOR) 20 MG tablet Take 1 tablet (20 mg total) by mouth daily. 90 tablet 1   Blood Glucose Monitoring Suppl DEVI 1 each by Does not apply route in the morning, at noon, and at bedtime. May substitute to any manufacturer covered by patient's insurance. 1 each 0   lidocaine-prilocaine (EMLA) cream Apply to affected area once 30 g 3   ondansetron (ZOFRAN) 8 MG tablet Take 1 tablet (8 mg total) by mouth every 8 (eight) hours as needed for nausea or vomiting. Start on the third day after chemotherapy. 30 tablet 1   prochlorperazine (COMPAZINE) 10 MG tablet Take 1 tablet (10 mg total) by mouth every 6 (six) hours as needed for nausea or vomiting. 30 tablet 1   metFORMIN (GLUCOPHAGE) 500 MG tablet Take 2 tablets(1000 mg) by mouth in the morning with breakfast and 1 tablet (500mg )  with dinner 60 tablet 4   No facility-administered medications prior to visit.    Allergies  Allergen Reactions   Paclitaxel Shortness Of  Breath and Other (See Comments)    See progress note from 04/10/22   Advil [Ibuprofen] Itching and Rash    ROS Review of Systems  Constitutional:  Negative for appetite change, chills, fatigue and fever.  HENT:  Negative for congestion, postnasal drip, rhinorrhea and sneezing.   Respiratory:  Negative for cough, shortness of breath and wheezing.   Cardiovascular:  Negative for chest pain, palpitations and leg swelling.  Gastrointestinal:  Negative for abdominal pain, constipation, nausea and  vomiting.  Genitourinary:  Negative for difficulty urinating, dysuria, flank pain and frequency.  Musculoskeletal:  Positive for arthralgias. Negative for back pain, joint swelling and myalgias.  Skin:  Negative for color change, pallor, rash and wound.  Neurological:  Negative for dizziness, facial asymmetry, weakness, numbness and headaches.  Psychiatric/Behavioral:  Negative for behavioral problems, confusion, self-injury and suicidal ideas.       Objective:    Physical Exam Vitals and nursing note reviewed.  Constitutional:      General: She is not in acute distress.    Appearance: Normal appearance. She is obese. She is not ill-appearing, toxic-appearing or diaphoretic.  HENT:     Mouth/Throat:     Mouth: Mucous membranes are moist.     Pharynx: Oropharynx is clear. No oropharyngeal exudate or posterior oropharyngeal erythema.  Eyes:     General: No scleral icterus.       Right eye: No discharge.        Left eye: No discharge.     Extraocular Movements: Extraocular movements intact.     Conjunctiva/sclera: Conjunctivae normal.  Cardiovascular:     Rate and Rhythm: Normal rate and regular rhythm.     Pulses: Normal pulses.     Heart sounds: Normal heart sounds. No murmur heard.    No friction rub. No gallop.  Pulmonary:     Effort: Pulmonary effort is normal. No respiratory distress.     Breath sounds: Normal breath sounds. No stridor. No wheezing, rhonchi or rales.  Chest:      Chest wall: No tenderness.  Abdominal:     General: There is no distension.     Palpations: Abdomen is soft.     Tenderness: There is no abdominal tenderness. There is no right CVA tenderness, left CVA tenderness or guarding.  Musculoskeletal:        General: Tenderness present. No swelling, deformity or signs of injury.     Right lower leg: No edema.     Left lower leg: No edema.     Comments: Tenderness on palpation of mid back area, skin warm and dry no redness or swelling noted  Skin:    General: Skin is warm and dry.     Capillary Refill: Capillary refill takes less than 2 seconds.     Coloration: Skin is not jaundiced or pale.     Findings: No bruising, erythema or lesion.  Neurological:     Mental Status: She is alert and oriented to person, place, and time.     Motor: No weakness.     Coordination: Coordination normal.     Gait: Gait normal.  Psychiatric:        Mood and Affect: Mood normal.        Behavior: Behavior normal.        Thought Content: Thought content normal.        Judgment: Judgment normal.     BP 123/76   Pulse 98   Temp 98.6 F (37 C)   Resp 14   Ht 5' (1.524 m)   Wt 228 lb (103.4 kg)   LMP 01/06/2022 Comment: Hysterectomy 02/10/22  SpO2 98%   BMI 44.53 kg/m  Wt Readings from Last 3 Encounters:  11/24/22 228 lb (103.4 kg)  11/05/22 228 lb 6.4 oz (103.6 kg)  09/04/22 228 lb 3.2 oz (103.5 kg)    Lab Results  Component Value Date   TSH 1.095 11/05/2022   Lab Results  Component Value Date   WBC  11.8 (H) 11/05/2022   HGB 13.2 11/05/2022   HCT 40.0 11/05/2022   MCV 88.1 11/05/2022   PLT 283 11/05/2022   Lab Results  Component Value Date   NA 135 11/05/2022   K 3.9 11/05/2022   CO2 27 11/05/2022   GLUCOSE 175 (H) 11/05/2022   BUN 9 11/05/2022   CREATININE 0.49 11/05/2022   BILITOT 0.5 11/05/2022   ALKPHOS 78 11/05/2022   AST 36 11/05/2022   ALT 36 11/05/2022   PROT 8.2 (H) 11/05/2022   ALBUMIN 4.0 11/05/2022   CALCIUM 9.6  11/05/2022   ANIONGAP 7 11/05/2022   Lab Results  Component Value Date   CHOL 178 08/24/2022   Lab Results  Component Value Date   HDL 39 (L) 08/24/2022   Lab Results  Component Value Date   LDLCALC 109 (H) 08/24/2022   Lab Results  Component Value Date   TRIG 171 (H) 08/24/2022   Lab Results  Component Value Date   CHOLHDL 4.6 (H) 08/24/2022   Lab Results  Component Value Date   HGBA1C 7.8 (A) 11/24/2022      Assessment & Plan:   Problem List Items Addressed This Visit       Endocrine   Diabetes mellitus type 2, uncomplicated (HCC) - Primary    Lab Results  Component Value Date   HGBA1C 7.8 (A) 11/24/2022  Chronic uncontrolled condition Increase metformin to 1000 mg twice daily Starting Trulicity 0.75 mg once weekly injection, patient denies personal or family history of MTC, M EN 2, denies history of pancreatitis.  Encouraged to avoid fatty fried foods eat smaller portion of meals to help prevent nausea.  Patient encouraged to report abdominal pain nausea vomiting Patient is uninsured will refer patient to the clinical pharmacist for medication assistance Patient counseled on low-carb modified diet CBG goals discussed Encouraged to engage in regular moderate exercises at least 150 minutes weekly as tolerated Follow-up in 5 weeks      Relevant Medications   metFORMIN (GLUCOPHAGE) 1000 MG tablet   Dulaglutide (TRULICITY) 0.75 MG/0.5ML SOAJ   Other Relevant Orders   POCT glycosylated hemoglobin (Hb A1C) (Completed)   AMB Referral [facility]BCI Care Management   Lipid panel     Other   Obesity, Class III, BMI 40-49.9 (morbid obesity) (HCC)    Wt Readings from Last 3 Encounters:  11/24/22 228 lb (103.4 kg)  11/05/22 228 lb 6.4 oz (103.6 kg)  09/04/22 228 lb 3.2 oz (103.5 kg)   Body mass index is 44.53 kg/m.  Patient counseled on low-carb modified diet Encouraged engage in regular moderate exercise at least 150 minutes weekly as tolerated      Relevant  Medications   metFORMIN (GLUCOPHAGE) 1000 MG tablet   Dulaglutide (TRULICITY) 0.75 MG/0.5ML SOAJ   Screening for colon cancer   Relevant Orders   Cologuard   Dyslipidemia, goal LDL below 70    Lab Results  Component Value Date   CHOL 178 08/24/2022   HDL 39 (L) 08/24/2022   LDLCALC 109 (H) 08/24/2022   TRIG 171 (H) 08/24/2022   CHOLHDL 4.6 (H) 08/24/2022  LDL goal is less than 70 Currently on atorvastatin 20 mg daily Check lipid panel      Mid back pain    Patient encouraged to take Tylenol 650 mg every 6 hours as needed Application of heat, ice stretching exercises encouraged For prevention education provided.      Fall    Fall prevention education and literature provided  Meds ordered this encounter  Medications   metFORMIN (GLUCOPHAGE) 1000 MG tablet    Sig: Take 1 tablet (1,000 mg total) by mouth 2 (two) times daily with a meal.    Dispense:  180 tablet    Refill:  3   Dulaglutide (TRULICITY) 0.75 MG/0.5ML SOAJ    Sig: Inject 0.75 mg into the skin once a week.    Dispense:  2 mL    Refill:  0    Follow-up: Return in about 5 weeks (around 12/29/2022) for DM.    Donell Beers, FNP

## 2022-11-24 NOTE — Assessment & Plan Note (Signed)
Patient encouraged to take Tylenol 650 mg every 6 hours as needed Application of heat, ice stretching exercises encouraged For prevention education provided.

## 2022-11-24 NOTE — Assessment & Plan Note (Signed)
Fall prevention education and literature provided

## 2022-11-24 NOTE — Assessment & Plan Note (Signed)
Lab Results  Component Value Date   CHOL 178 08/24/2022   HDL 39 (L) 08/24/2022   LDLCALC 109 (H) 08/24/2022   TRIG 171 (H) 08/24/2022   CHOLHDL 4.6 (H) 08/24/2022  LDL goal is less than 70 Currently on atorvastatin 20 mg daily Check lipid panel

## 2022-11-24 NOTE — Assessment & Plan Note (Signed)
Wt Readings from Last 3 Encounters:  11/24/22 228 lb (103.4 kg)  11/05/22 228 lb 6.4 oz (103.6 kg)  09/04/22 228 lb 3.2 oz (103.5 kg)   Body mass index is 44.53 kg/m.  Patient counseled on low-carb modified diet Encouraged engage in regular moderate exercise at least 150 minutes weekly as tolerated

## 2022-11-24 NOTE — Assessment & Plan Note (Signed)
Lab Results  Component Value Date   HGBA1C 7.8 (A) 11/24/2022  Chronic uncontrolled condition Increase metformin to 1000 mg twice daily Starting Trulicity 0.75 mg once weekly injection, patient denies personal or family history of MTC, M EN 2, denies history of pancreatitis.  Encouraged to avoid fatty fried foods eat smaller portion of meals to help prevent nausea.  Patient encouraged to report abdominal pain nausea vomiting Patient is uninsured will refer patient to the clinical pharmacist for medication assistance Patient counseled on low-carb modified diet CBG goals discussed Encouraged to engage in regular moderate exercises at least 150 minutes weekly as tolerated Follow-up in 5 weeks

## 2022-11-24 NOTE — Patient Instructions (Addendum)
Please consider getting Shingrix, influenza  and Tdap vaccine at local pharmacy.   1. Type 2 diabetes mellitus without complication, without long-term current use of insulin (HCC)  - POCT glycosylated hemoglobin (Hb A1C) - metFORMIN (GLUCOPHAGE) 1000 MG tablet; Take 1 tablet (1,000 mg total) by mouth 2 (two) times daily with a meal.  Dispense: 180 tablet; Refill: 3 - Dulaglutide (TRULICITY) 0.75 MG/0.5ML SOAJ; Inject 0.75 mg into the skin once a week.  Dispense: 2 mL; Refill: 0 - AMB Referral VBCI Care Management - Lipid panel   Goal for fasting blood sugar ranges from 80 to 120 and 2 hours after any meal or at bedtime should be between 130 to 170.   For back pain it is okay to take Tylenol 650 mg every 6 hours as needed, continue use of heating pad, also encouraged stretching exercises   It is important that you exercise regularly at least 30 minutes 5 times a week as tolerated  Think about what you will eat, plan ahead. Choose " clean, green, fresh or frozen" over canned, processed or packaged foods which are more sugary, salty and fatty. 70 to 75% of food eaten should be vegetables and fruit. Three meals at set times with snacks allowed between meals, but they must be fruit or vegetables. Aim to eat over a 12 hour period , example 7 am to 7 pm, and STOP after  your last meal of the day. Drink water,generally about 64 ounces per day, no other drink is as healthy. Fruit juice is best enjoyed in a healthy way, by EATING the fruit.  Thanks for choosing [facility] we consider it a privelige to serve you.

## 2022-11-25 ENCOUNTER — Encounter: Payer: Self-pay | Admitting: Hematology and Oncology

## 2022-11-25 ENCOUNTER — Other Ambulatory Visit: Payer: Self-pay | Admitting: Nurse Practitioner

## 2022-11-25 ENCOUNTER — Other Ambulatory Visit: Payer: Self-pay

## 2022-11-25 ENCOUNTER — Telehealth: Payer: Self-pay

## 2022-11-25 ENCOUNTER — Other Ambulatory Visit (HOSPITAL_COMMUNITY): Payer: Self-pay

## 2022-11-25 DIAGNOSIS — E785 Hyperlipidemia, unspecified: Secondary | ICD-10-CM

## 2022-11-25 LAB — LIPID PANEL
Chol/HDL Ratio: 3.9 ratio (ref 0.0–4.4)
Cholesterol, Total: 167 mg/dL (ref 100–199)
HDL: 43 mg/dL (ref >39)
LDL Chol Calc (NIH): 96 mg/dL (ref 0–99)
Triglycerides: 163 mg/dL — ABNORMAL HIGH (ref 0–149)
VLDL Cholesterol Cal: 28 mg/dL (ref 5–40)

## 2022-11-25 MED ORDER — ATORVASTATIN CALCIUM 40 MG PO TABS
40.0000 mg | ORAL_TABLET | Freq: Every day | ORAL | 1 refills | Status: DC
  Filled 2022-11-25 – 2023-02-02 (×2): qty 90, 90d supply, fill #0

## 2022-11-25 NOTE — Progress Notes (Signed)
   Care Guide Note  11/25/2022 Name: Maureen Campos MRN: [id number] DOB: 03-Sep-1970  Referred by: Donell Beers, FNP Reason for referral : Care Coordination (Outreach to schedule with Pharm d )   Maureen Campos is a 52 y.o. year old female who is a primary care patient of Donell Beers, FNP. Maureen Campos was referred to the pharmacist for assistance related to DM.    Successful contact was made with the patient to discuss pharmacy services including being ready for the pharmacist to call at least 5 minutes before the scheduled appointment time, to have medication bottles and any blood sugar or blood pressure readings ready for review. The patient agreed to meet with the pharmacist via with the pharmacist via telephone visit on (date/time).  12/22/2022  Penne Lash , RMA     Painter  [facility] Guide  Direct Dial: [phone]  Website: [email or web address]

## 2022-11-27 ENCOUNTER — Other Ambulatory Visit: Payer: No Typology Code available for payment source

## 2022-11-27 ENCOUNTER — Ambulatory Visit: Payer: No Typology Code available for payment source | Admitting: Hematology and Oncology

## 2022-11-27 ENCOUNTER — Ambulatory Visit: Payer: No Typology Code available for payment source

## 2022-12-22 ENCOUNTER — Encounter: Payer: Self-pay | Admitting: Oncology

## 2022-12-22 ENCOUNTER — Telehealth: Payer: Self-pay

## 2022-12-22 ENCOUNTER — Inpatient Hospital Stay
Admission: RE | Admit: 2022-12-22 | Discharge: 2022-12-22 | Disposition: A | Discharge status: Home / Self Care | Payer: Self-pay | Source: Ambulatory Visit | Attending: Hematology and Oncology | Admitting: Hematology and Oncology

## 2022-12-22 ENCOUNTER — Inpatient Hospital Stay: Payer: No Typology Code available for payment source

## 2022-12-22 ENCOUNTER — Other Ambulatory Visit: Payer: Self-pay

## 2022-12-22 ENCOUNTER — Encounter: Payer: Self-pay | Admitting: Hematology and Oncology

## 2022-12-22 ENCOUNTER — Inpatient Hospital Stay: Payer: No Typology Code available for payment source | Attending: Psychiatry | Admitting: Hematology and Oncology

## 2022-12-22 ENCOUNTER — Other Ambulatory Visit (HOSPITAL_COMMUNITY): Payer: Self-pay

## 2022-12-22 VITALS — BP 162/82 | HR 82 | Temp 98.4°F | Resp 18 | Ht 60.0 in | Wt 233.6 lb

## 2022-12-22 DIAGNOSIS — C541 Malignant neoplasm of endometrium: Secondary | ICD-10-CM

## 2022-12-22 DIAGNOSIS — I10 Essential (primary) hypertension: Secondary | ICD-10-CM | POA: Insufficient documentation

## 2022-12-22 DIAGNOSIS — Z5112 Encounter for antineoplastic immunotherapy: Secondary | ICD-10-CM | POA: Insufficient documentation

## 2022-12-22 DIAGNOSIS — Z90710 Acquired absence of both cervix and uterus: Secondary | ICD-10-CM | POA: Insufficient documentation

## 2022-12-22 DIAGNOSIS — Z90722 Acquired absence of ovaries, bilateral: Secondary | ICD-10-CM | POA: Insufficient documentation

## 2022-12-22 DIAGNOSIS — Z9079 Acquired absence of other genital organ(s): Secondary | ICD-10-CM | POA: Insufficient documentation

## 2022-12-22 DIAGNOSIS — R030 Elevated blood-pressure reading, without diagnosis of hypertension: Secondary | ICD-10-CM | POA: Insufficient documentation

## 2022-12-22 LAB — CBC WITH DIFFERENTIAL (CANCER CENTER ONLY)
Abs Immature Granulocytes: 0.02 10*3/uL (ref 0.00–0.07)
Basophils Absolute: 0.1 10*3/uL (ref 0.0–0.1)
Basophils Relative: 1 %
Eosinophils Absolute: 0.3 10*3/uL (ref 0.0–0.5)
Eosinophils Relative: 3 %
HCT: 39.1 % (ref 36.0–46.0)
Hemoglobin: 12.7 g/dL (ref 12.0–15.0)
Immature Granulocytes: 0 %
Lymphocytes Relative: 36 %
Lymphs Abs: 3.3 10*3/uL (ref 0.7–4.0)
MCH: 28.2 pg (ref 26.0–34.0)
MCHC: 32.5 g/dL (ref 30.0–36.0)
MCV: 86.9 fL (ref 80.0–100.0)
Monocytes Absolute: 0.5 10*3/uL (ref 0.1–1.0)
Monocytes Relative: 6 %
Neutro Abs: 5.1 10*3/uL (ref 1.7–7.7)
Neutrophils Relative %: 54 %
Platelet Count: 283 10*3/uL (ref 150–400)
RBC: 4.5 MIL/uL (ref 3.87–5.11)
RDW: 14.7 % (ref 11.5–15.5)
WBC Count: 9.2 10*3/uL (ref 4.0–10.5)
nRBC: 0 % (ref 0.0–0.2)

## 2022-12-22 LAB — CMP (CANCER CENTER ONLY)
ALT: 23 U/L (ref 0–44)
AST: 15 U/L (ref 15–41)
Albumin: 3.8 g/dL (ref 3.5–5.0)
Alkaline Phosphatase: 66 U/L (ref 38–126)
Anion gap: 7 (ref 5–15)
BUN: 16 mg/dL (ref 6–20)
CO2: 28 mmol/L (ref 22–32)
Calcium: 9.2 mg/dL (ref 8.9–10.3)
Chloride: 101 mmol/L (ref 98–111)
Creatinine: 0.57 mg/dL (ref 0.44–1.00)
GFR, Estimated: >60 mL/min (ref >60)
Glucose, Bld: 130 mg/dL — ABNORMAL HIGH (ref 70–99)
Potassium: 3.7 mmol/L (ref 3.5–5.1)
Sodium: 136 mmol/L (ref 135–145)
Total Bilirubin: 0.4 mg/dL (ref <1.2)
Total Protein: 7.3 g/dL (ref 6.5–8.1)

## 2022-12-22 LAB — TSH: TSH: 7.27 u[IU]/mL — ABNORMAL HIGH (ref 0.350–4.500)

## 2022-12-22 MED ORDER — SODIUM CHLORIDE 0.9% FLUSH
10.0000 mL | INTRAVENOUS | Status: DC | PRN
  Administered 2022-12-22: 10 mL

## 2022-12-22 MED ORDER — SODIUM CHLORIDE 0.9 % IV SOLN: Freq: Once | INTRAVENOUS | Status: AC

## 2022-12-22 MED ORDER — LEVOTHYROXINE SODIUM 50 MCG PO TABS
50.0000 ug | ORAL_TABLET | Freq: Every day | ORAL | 1 refills | Status: DC
  Filled 2022-12-22: qty 30, 30d supply, fill #0

## 2022-12-22 MED ORDER — HEPARIN SOD (PORK) LOCK FLUSH 100 UNIT/ML IV SOLN
500.0000 [IU] | Freq: Once | INTRAVENOUS | Status: AC | PRN
  Administered 2022-12-22: 500 [IU]

## 2022-12-22 MED ORDER — SODIUM CHLORIDE 0.9% FLUSH
10.0000 mL | Freq: Once | INTRAVENOUS | Status: AC
  Administered 2022-12-22: 10 mL

## 2022-12-22 MED ORDER — DOSTARLIMAB-GXLY CHEMO 500 MG/10ML IV SOLN
1000.0000 mg | Freq: Once | INTRAVENOUS | Status: AC
  Administered 2022-12-22: 1000 mg via INTRAVENOUS
  Filled 2022-12-22: qty 20

## 2022-12-22 NOTE — Assessment & Plan Note (Signed)
We discussed importance of dietary modification during treatment

## 2022-12-22 NOTE — Assessment & Plan Note (Signed)
I have reviewed documentation and test results from [facility] We will get imaging studies downloaded here In the meantime, she will continue maintenance immunotherapy As the patient already had imaging study done, I will defer her next imaging until March 2025
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
## 2022-12-22 NOTE — Patient Instructions (Signed)
 [facility] - A DEPT OF [facility]  Discharge Instructions: Thank you for choosing [facility] to provide your oncology and hematology care.   If you have a lab appointment with the Cancer Center, please go directly to the Cancer Center and check in at the registration area.   Wear comfortable clothing and clothing appropriate for easy access to any Portacath or PICC line.   We strive to give you quality time with your provider. You may need to reschedule your appointment if you arrive late (15 or more minutes).  Arriving late affects you and other patients whose appointments are after yours.  Also, if you miss three or more appointments without notifying the office, you may be dismissed from the clinic at the provider's discretion.      For prescription refill requests, have your pharmacy contact our office and allow 72 hours for refills to be completed.    Today you received the following chemotherapy and/or immunotherapy agents Jemperli      To help prevent nausea and vomiting after your treatment, we encourage you to take your nausea medication as directed.  BELOW ARE SYMPTOMS THAT SHOULD BE REPORTED IMMEDIATELY: *FEVER GREATER THAN 100.4 F (38 C) OR HIGHER *CHILLS OR SWEATING *NAUSEA AND VOMITING THAT IS NOT CONTROLLED WITH YOUR NAUSEA MEDICATION *UNUSUAL SHORTNESS OF BREATH *UNUSUAL BRUISING OR BLEEDING *URINARY PROBLEMS (pain or burning when urinating, or frequent urination) *BOWEL PROBLEMS (unusual diarrhea, constipation, pain near the anus) TENDERNESS IN MOUTH AND THROAT WITH OR WITHOUT PRESENCE OF ULCERS (sore throat, sores in mouth, or a toothache) UNUSUAL RASH, SWELLING OR PAIN  UNUSUAL VAGINAL DISCHARGE OR ITCHING   Items with * indicate a potential emergency and should be followed up as soon as possible or go to the Emergency Department if any problems should occur.  Please show the CHEMOTHERAPY ALERT CARD or IMMUNOTHERAPY  ALERT CARD at check-in to the Emergency Department and triage nurse.  Should you have questions after your visit or need to cancel or reschedule your appointment, please contact [facility] - A DEPT OF [facility]  Dept: [phone]  and follow the prompts.  Office hours are 8:00 a.m. to 4:30 p.m. Monday - Friday. Please note that voicemails left after 4:00 p.m. may not be returned until the following business day.  We are closed weekends and major holidays. You have access to a nurse at all times for urgent questions. Please call the main number to the clinic Dept: [phone] and follow the prompts.   For any non-urgent questions, you may also contact your provider using MyChart. We now offer e-Visits for anyone 23 and older to request care online for non-urgent symptoms. For details visit [email or web address].   Also download the MyChart app! Go to the app store, search "MyChart", open the app, select [place], and log in with your MyChart username and password.

## 2022-12-22 NOTE — Telephone Encounter (Signed)
-----   Message from Artis Delay sent at 12/22/2022  3:52 PM EST ----- TSH is high she needs to be started on synthroid Please e-scribe 50 mg PO daily to take 30 mins before breakfast 30 tabs, 1 refill

## 2022-12-22 NOTE — Telephone Encounter (Signed)
Called and given below message. She verbalized understanding and appreciated the call. Sent Rx to her preferred pharmacy. She will start Rx.
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
## 2022-12-23 LAB — T4: T4, Total: 7.2 ug/dL (ref 4.5–12.0)

## 2022-12-24 ENCOUNTER — Other Ambulatory Visit: Payer: Self-pay

## 2023-01-04 ENCOUNTER — Other Ambulatory Visit: Payer: Self-pay

## 2023-01-04 DIAGNOSIS — E119 Type 2 diabetes mellitus without complications: Secondary | ICD-10-CM

## 2023-01-04 MED ORDER — TRULICITY 0.75 MG/0.5ML ~~LOC~~ SOAJ
0.7500 mg | SUBCUTANEOUS | 2 refills | Status: DC
  Filled 2023-01-04 – 2023-01-05 (×2): qty 2, 28d supply, fill #0
  Filled 2023-01-27: qty 2, 28d supply, fill #1
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
## 2023-01-05 ENCOUNTER — Other Ambulatory Visit: Payer: Self-pay

## 2023-01-05 ENCOUNTER — Other Ambulatory Visit (HOSPITAL_COMMUNITY): Payer: Self-pay

## 2023-01-05 ENCOUNTER — Encounter: Payer: Self-pay | Admitting: Hematology and Oncology

## 2023-01-05 ENCOUNTER — Other Ambulatory Visit: Payer: No Typology Code available for payment source

## 2023-01-05 ENCOUNTER — Encounter: Payer: Self-pay | Admitting: Nurse Practitioner

## 2023-01-05 ENCOUNTER — Ambulatory Visit (INDEPENDENT_AMBULATORY_CARE_PROVIDER_SITE_OTHER): Payer: Self-pay | Admitting: Nurse Practitioner

## 2023-01-05 VITALS — BP 134/86 | HR 93 | Temp 97.3°F | Wt 234.0 lb

## 2023-01-05 DIAGNOSIS — R030 Elevated blood-pressure reading, without diagnosis of hypertension: Secondary | ICD-10-CM

## 2023-01-05 DIAGNOSIS — E785 Hyperlipidemia, unspecified: Secondary | ICD-10-CM

## 2023-01-05 DIAGNOSIS — M47816 Spondylosis without myelopathy or radiculopathy, lumbar region: Secondary | ICD-10-CM | POA: Insufficient documentation

## 2023-01-05 DIAGNOSIS — M549 Dorsalgia, unspecified: Secondary | ICD-10-CM

## 2023-01-05 DIAGNOSIS — E119 Type 2 diabetes mellitus without complications: Secondary | ICD-10-CM

## 2023-01-05 MED ORDER — CYCLOBENZAPRINE HCL 5 MG PO TABS
5.0000 mg | ORAL_TABLET | Freq: Three times a day (TID) | ORAL | 0 refills | Status: DC | PRN
  Filled 2023-01-05: qty 30, 10d supply, fill #0

## 2023-01-05 NOTE — Assessment & Plan Note (Addendum)
 Currently on atorvastatin 40 mg daily LDL goal is less than 70 Checking direct LD  today

## 2023-01-05 NOTE — Assessment & Plan Note (Signed)
 Continue Tylenol , take  650 mg every 6 hours as needed Patient is allergic to NSAIDs Flexeril  5 mg 3 times daily as needed ordered Encouraged to follow-up with orthopedics as planned  1.  Questionable right L1-L3 and left L5 transverse process fractures are better demonstrated on prior CT. 2.  No acute compression deformity involving the lumbar spine. 3.  Multilevel degenerative disc and facet disease, most pronounced in the lower lumbar spine.   She has been taking Tylenol  as needed and using heating pad at home.  Stated that she has upcoming appoint with orthopedics.  Currently has aching pain rated 7/10 from her neck down to her lower back.

## 2023-01-05 NOTE — Assessment & Plan Note (Signed)
 Continue metformin  1000 mg twice daily Starting Trulicity  0.75 mg once weekly injection  Patient encouraged to avoid fatty fried foods eat smaller portions of meal to help decrease nausea.  Encouraged to report abdominal pain, nausea, vomiting.  Patient counseled on low-carb diet encouraged engage in regular moderate exercises at least 150 minutes weekly as tolerated. Follow-up in the office in 3 months The Clinical pharmacist will follow-up with the patient in January, I appreciate collaboration with them

## 2023-01-05 NOTE — Patient Instructions (Addendum)
 Please take Tylenol  650 mg every 6 hours as needed Take Flexeril  5 mg 3 times daily as needed Please follow-up with the orthopedics as planned  It is important that you exercise regularly at least 30 minutes 5 times a week as tolerated  Think about what you will eat, plan ahead. Choose  clean, green, fresh or frozen over canned, processed or packaged foods which are more sugary, salty and fatty. 70 to 75% of food eaten should be vegetables and fruit. Three meals at set times with snacks allowed between meals, but they must be fruit or vegetables. Aim to eat over a 12 hour period , example 7 am to 7 pm, and STOP after  your last meal of the day. Drink water ,generally about 64 ounces per day, no other drink is as healthy. Fruit juice is best enjoyed in a healthy way, by EATING the fruit.  Thanks for choosing [facility] we consider it a privelige to serve you.

## 2023-01-05 NOTE — Assessment & Plan Note (Addendum)
 BP Readings from Last 3 Encounters:  01/05/23 134/86  12/22/22 (!) 162/82  11/24/22 123/76  Blood pressure is elevated in the office today probably due to her pain BP has been previously well-controlled without medication Continue to monitor at this time and start on medication at next visit if needed
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
## 2023-01-06 ENCOUNTER — Other Ambulatory Visit: Payer: Self-pay

## 2023-01-06 LAB — LDL CHOLESTEROL, DIRECT: LDL Direct: 75 mg/dL (ref 0–99)

## 2023-01-22 ENCOUNTER — Encounter: Payer: Self-pay | Admitting: Hematology and Oncology

## 2023-01-27 ENCOUNTER — Other Ambulatory Visit: Payer: Self-pay

## 2023-01-27 ENCOUNTER — Encounter: Payer: Self-pay | Admitting: Hematology and Oncology

## 2023-01-31 ENCOUNTER — Encounter: Payer: Self-pay | Admitting: Hematology and Oncology

## 2023-02-02 ENCOUNTER — Inpatient Hospital Stay: Payer: No Typology Code available for payment source

## 2023-02-02 ENCOUNTER — Telehealth: Payer: Self-pay

## 2023-02-02 ENCOUNTER — Other Ambulatory Visit (HOSPITAL_COMMUNITY): Payer: Self-pay

## 2023-02-02 ENCOUNTER — Other Ambulatory Visit: Payer: Self-pay

## 2023-02-02 ENCOUNTER — Encounter: Payer: Self-pay | Admitting: Hematology and Oncology

## 2023-02-02 ENCOUNTER — Inpatient Hospital Stay: Payer: Self-pay | Attending: Psychiatry | Admitting: Hematology and Oncology

## 2023-02-02 VITALS — BP 146/94 | HR 77 | Temp 97.5°F | Resp 18 | Ht 60.0 in | Wt 233.6 lb

## 2023-02-02 DIAGNOSIS — Z90710 Acquired absence of both cervix and uterus: Secondary | ICD-10-CM | POA: Insufficient documentation

## 2023-02-02 DIAGNOSIS — Z9079 Acquired absence of other genital organ(s): Secondary | ICD-10-CM | POA: Insufficient documentation

## 2023-02-02 DIAGNOSIS — Z90722 Acquired absence of ovaries, bilateral: Secondary | ICD-10-CM | POA: Insufficient documentation

## 2023-02-02 DIAGNOSIS — Z7901 Long term (current) use of anticoagulants: Secondary | ICD-10-CM | POA: Insufficient documentation

## 2023-02-02 DIAGNOSIS — Z86718 Personal history of other venous thrombosis and embolism: Secondary | ICD-10-CM | POA: Insufficient documentation

## 2023-02-02 DIAGNOSIS — R030 Elevated blood-pressure reading, without diagnosis of hypertension: Secondary | ICD-10-CM

## 2023-02-02 DIAGNOSIS — E119 Type 2 diabetes mellitus without complications: Secondary | ICD-10-CM

## 2023-02-02 DIAGNOSIS — C541 Malignant neoplasm of endometrium: Secondary | ICD-10-CM

## 2023-02-02 DIAGNOSIS — Z5112 Encounter for antineoplastic immunotherapy: Secondary | ICD-10-CM | POA: Insufficient documentation

## 2023-02-02 DIAGNOSIS — I82C11 Acute embolism and thrombosis of right internal jugular vein: Secondary | ICD-10-CM

## 2023-02-02 LAB — CBC WITH DIFFERENTIAL (CANCER CENTER ONLY)
Abs Immature Granulocytes: 0.02 10*3/uL (ref 0.00–0.07)
Basophils Absolute: 0 10*3/uL (ref 0.0–0.1)
Basophils Relative: 1 %
Eosinophils Absolute: 0.1 10*3/uL (ref 0.0–0.5)
Eosinophils Relative: 2 %
HCT: 40.6 % (ref 36.0–46.0)
Hemoglobin: 13.2 g/dL (ref 12.0–15.0)
Immature Granulocytes: 0 %
Lymphocytes Relative: 39 %
Lymphs Abs: 3.2 10*3/uL (ref 0.7–4.0)
MCH: 28.3 pg (ref 26.0–34.0)
MCHC: 32.5 g/dL (ref 30.0–36.0)
MCV: 86.9 fL (ref 80.0–100.0)
Monocytes Absolute: 0.6 10*3/uL (ref 0.1–1.0)
Monocytes Relative: 7 %
Neutro Abs: 4.3 10*3/uL (ref 1.7–7.7)
Neutrophils Relative %: 51 %
Platelet Count: 251 10*3/uL (ref 150–400)
RBC: 4.67 MIL/uL (ref 3.87–5.11)
RDW: 14.6 % (ref 11.5–15.5)
WBC Count: 8.2 10*3/uL (ref 4.0–10.5)
nRBC: 0 % (ref 0.0–0.2)

## 2023-02-02 LAB — CMP (CANCER CENTER ONLY)
ALT: 19 U/L (ref 0–44)
AST: 13 U/L — ABNORMAL LOW (ref 15–41)
Albumin: 3.8 g/dL (ref 3.5–5.0)
Alkaline Phosphatase: 71 U/L (ref 38–126)
Anion gap: 7 (ref 5–15)
BUN: 13 mg/dL (ref 6–20)
CO2: 27 mmol/L (ref 22–32)
Calcium: 9.1 mg/dL (ref 8.9–10.3)
Chloride: 104 mmol/L (ref 98–111)
Creatinine: 0.56 mg/dL (ref 0.44–1.00)
GFR, Estimated: >60 mL/min (ref >60)
Glucose, Bld: 113 mg/dL — ABNORMAL HIGH (ref 70–99)
Potassium: 3.8 mmol/L (ref 3.5–5.1)
Sodium: 138 mmol/L (ref 135–145)
Total Bilirubin: 0.5 mg/dL (ref 0.0–1.2)
Total Protein: 7.5 g/dL (ref 6.5–8.1)

## 2023-02-02 LAB — TSH: TSH: 7.006 u[IU]/mL — ABNORMAL HIGH (ref 0.350–4.500)

## 2023-02-02 MED ORDER — ACETAMINOPHEN 325 MG PO TABS
650.0000 mg | ORAL_TABLET | Freq: Once | ORAL | Status: AC
  Administered 2023-02-02: 650 mg via ORAL
  Filled 2023-02-02: qty 2

## 2023-02-02 MED ORDER — HEPARIN SOD (PORK) LOCK FLUSH 100 UNIT/ML IV SOLN
500.0000 [IU] | Freq: Once | INTRAVENOUS | Status: AC | PRN
  Administered 2023-02-02: 500 [IU]

## 2023-02-02 MED ORDER — SODIUM CHLORIDE 0.9% FLUSH
10.0000 mL | INTRAVENOUS | Status: DC | PRN
  Administered 2023-02-02: 10 mL

## 2023-02-02 MED ORDER — SODIUM CHLORIDE 0.9 % IV SOLN: Freq: Once | INTRAVENOUS | Status: AC

## 2023-02-02 MED ORDER — SODIUM CHLORIDE 0.9% FLUSH
10.0000 mL | Freq: Once | INTRAVENOUS | Status: AC
  Administered 2023-02-02: 10 mL

## 2023-02-02 MED ORDER — SODIUM CHLORIDE 0.9 % IV SOLN
1000.0000 mg | Freq: Once | INTRAVENOUS | Status: AC
  Administered 2023-02-02: 1000 mg via INTRAVENOUS
  Filled 2023-02-02: qty 20

## 2023-02-02 MED ORDER — LEVOTHYROXINE SODIUM 75 MCG PO TABS
75.0000 ug | ORAL_TABLET | Freq: Every day | ORAL | 1 refills | Status: DC
Start: 2023-02-02 — End: 2023-03-17
  Filled 2023-02-02 (×2): qty 30, 30d supply, fill #0
  Filled 2023-03-01: qty 30, 30d supply, fill #1

## 2023-02-02 NOTE — Patient Instructions (Signed)
[facility] - A DEPT OF [facility]  Discharge Instructions: Thank you for choosing [facility] to provide your oncology and hematology care.   If you have a lab appointment with the Cancer Center, please go directly to the Cancer Center and check in at the registration area.   Wear comfortable clothing and clothing appropriate for easy access to any Portacath or PICC line.   We strive to give you quality time with your provider. You may need to reschedule your appointment if you arrive late (15 or more minutes).  Arriving late affects you and other patients whose appointments are after yours.  Also, if you miss three or more appointments without notifying the office, you may be dismissed from the clinic at the provider's discretion.      For prescription refill requests, have your pharmacy contact our office and allow 72 hours for refills to be completed.    Today you received the following chemotherapy and/or immunotherapy agents: Jemperli      To help prevent nausea and vomiting after your treatment, we encourage you to take your nausea medication as directed.  BELOW ARE SYMPTOMS THAT SHOULD BE REPORTED IMMEDIATELY: *FEVER GREATER THAN 100.4 F (38 C) OR HIGHER *CHILLS OR SWEATING *NAUSEA AND VOMITING THAT IS NOT CONTROLLED WITH YOUR NAUSEA MEDICATION *UNUSUAL SHORTNESS OF BREATH *UNUSUAL BRUISING OR BLEEDING *URINARY PROBLEMS (pain or burning when urinating, or frequent urination) *BOWEL PROBLEMS (unusual diarrhea, constipation, pain near the anus) TENDERNESS IN MOUTH AND THROAT WITH OR WITHOUT PRESENCE OF ULCERS (sore throat, sores in mouth, or a toothache) UNUSUAL RASH, SWELLING OR PAIN  UNUSUAL VAGINAL DISCHARGE OR ITCHING   Items with * indicate a potential emergency and should be followed up as soon as possible or go to the Emergency Department if any problems should occur.  Please show the CHEMOTHERAPY ALERT CARD or IMMUNOTHERAPY  ALERT CARD at check-in to the Emergency Department and triage nurse.  Should you have questions after your visit or need to cancel or reschedule your appointment, please contact [facility] - A DEPT OF [facility]  Dept: [phone]  and follow the prompts.  Office hours are 8:00 a.m. to 4:30 p.m. Monday - Friday. Please note that voicemails left after 4:00 p.m. may not be returned until the following business day.  We are closed weekends and major holidays. You have access to a nurse at all times for urgent questions. Please call the main number to the clinic Dept: [phone] and follow the prompts.   For any non-urgent questions, you may also contact your provider using MyChart. We now offer e-Visits for anyone 35 and older to request care online for non-urgent symptoms. For details visit [email or web address].   Also download the MyChart app! Go to the app store, search "MyChart", open the app, select Normal, and log in with your MyChart username and password.

## 2023-02-02 NOTE — Telephone Encounter (Signed)
-----   Message from Artis Delay sent at 02/02/2023  3:41 PM EST ----- Please call her increase synthroid to 75 mcg Send 60 tabs 1 refill

## 2023-02-02 NOTE — Assessment & Plan Note (Signed)
She is doing well on anticoagulation therapy Recommend she continues the same

## 2023-02-02 NOTE — Assessment & Plan Note (Signed)
I have reviewed her image study from December that was done at [facility] which show no evidence of cancer relapse In the meantime, she will continue maintenance immunotherapy As the patient already had imaging study done, I will defer her next imaging until March 2025

## 2023-02-02 NOTE — Addendum Note (Signed)
Addended byBertis Ruddy, Deveon Kisiel on: 02/02/2023 09:59 AM   Modules accepted: Orders

## 2023-02-02 NOTE — Telephone Encounter (Signed)
Called and given below message. She verbalized understanding and appreciated the call. Rx sent to her preferred pharmacy.
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
## 2023-02-03 ENCOUNTER — Other Ambulatory Visit: Payer: Self-pay

## 2023-02-03 ENCOUNTER — Encounter: Payer: Self-pay | Admitting: Hematology and Oncology

## 2023-02-03 ENCOUNTER — Other Ambulatory Visit: Payer: Self-pay | Admitting: Nurse Practitioner

## 2023-02-03 DIAGNOSIS — R030 Elevated blood-pressure reading, without diagnosis of hypertension: Secondary | ICD-10-CM

## 2023-02-03 DIAGNOSIS — I10 Essential (primary) hypertension: Secondary | ICD-10-CM

## 2023-02-03 LAB — T4: T4, Total: 6.9 ug/dL (ref 4.5–12.0)

## 2023-02-03 MED ORDER — VALSARTAN 80 MG PO TABS
80.0000 mg | ORAL_TABLET | Freq: Every day | ORAL | 6 refills | Status: DC
  Filled 2023-02-03: qty 30, 30d supply, fill #0
  Filled 2023-03-01: qty 30, 30d supply, fill #1
  Filled 2023-04-20: qty 30, 30d supply, fill #2

## 2023-02-03 MED ORDER — TRULICITY 1.5 MG/0.5ML ~~LOC~~ SOAJ
1.5000 mg | SUBCUTANEOUS | 3 refills | Status: DC
  Filled 2023-02-03: qty 2, 28d supply, fill #0
  Filled 2023-03-01: qty 2, 28d supply, fill #1

## 2023-02-03 NOTE — Progress Notes (Signed)
Contact patient to update instructions to start valsartan 80 mg PO daily for blood pressure instead of amlodipine as discussed last night, due to improved efficacy for reducing progression of kidney disease. Patient has follow-up scheduled with PCP at the end of February for appropriate lab monitoring after starting an ARB.   Patient agreeable to plan, voiced understanding.   Nils Pyle, PharmD PGY1 Pharmacy Resident

## 2023-02-04 ENCOUNTER — Other Ambulatory Visit: Payer: Self-pay

## 2023-02-05 ENCOUNTER — Other Ambulatory Visit: Payer: Self-pay

## 2023-02-08 ENCOUNTER — Other Ambulatory Visit: Payer: No Typology Code available for payment source

## 2023-02-08 DIAGNOSIS — I10 Essential (primary) hypertension: Secondary | ICD-10-CM

## 2023-02-09 ENCOUNTER — Encounter: Payer: Self-pay | Admitting: Hematology and Oncology

## 2023-02-09 LAB — BASIC METABOLIC PANEL
BUN/Creatinine Ratio: 21 (ref 9–23)
BUN: 11 mg/dL (ref 6–24)
CO2: 24 mmol/L (ref 20–29)
Calcium: 9.3 mg/dL (ref 8.7–10.2)
Chloride: 99 mmol/L (ref 96–106)
Creatinine, Ser: 0.53 mg/dL — ABNORMAL LOW (ref 0.57–1.00)
Glucose: 213 mg/dL — ABNORMAL HIGH (ref 70–99)
Potassium: 4.2 mmol/L (ref 3.5–5.2)
Sodium: 139 mmol/L (ref 134–144)
eGFR: 112 mL/min/{1.73_m2} (ref >59)

## 2023-03-01 ENCOUNTER — Encounter: Payer: Self-pay | Admitting: Hematology and Oncology

## 2023-03-01 ENCOUNTER — Other Ambulatory Visit: Payer: Self-pay

## 2023-03-03 ENCOUNTER — Other Ambulatory Visit: Payer: Self-pay

## 2023-03-05 ENCOUNTER — Ambulatory Visit: Payer: Self-pay | Admitting: Nurse Practitioner

## 2023-03-09 ENCOUNTER — Encounter (HOSPITAL_COMMUNITY): Payer: Self-pay

## 2023-03-09 ENCOUNTER — Ambulatory Visit (HOSPITAL_COMMUNITY)
Admission: RE | Admit: 2023-03-09 | Discharge: 2023-03-09 | Disposition: A | Discharge status: Home / Self Care | Payer: Self-pay | Source: Ambulatory Visit | Attending: Hematology and Oncology | Admitting: Hematology and Oncology

## 2023-03-09 DIAGNOSIS — C541 Malignant neoplasm of endometrium: Secondary | ICD-10-CM | POA: Insufficient documentation

## 2023-03-09 MED ORDER — HEPARIN SOD (PORK) LOCK FLUSH 100 UNIT/ML IV SOLN
INTRAVENOUS | Status: AC
  Filled 2023-03-09: qty 5

## 2023-03-09 MED ORDER — HEPARIN SOD (PORK) LOCK FLUSH 100 UNIT/ML IV SOLN
500.0000 [IU] | Freq: Once | INTRAVENOUS | Status: AC
  Administered 2023-03-09: 500 [IU] via INTRAVENOUS

## 2023-03-09 MED ORDER — IOHEXOL 300 MG/ML  SOLN
100.0000 mL | Freq: Once | INTRAMUSCULAR | Status: AC | PRN
  Administered 2023-03-09: 100 mL via INTRAVENOUS

## 2023-03-12 ENCOUNTER — Encounter: Payer: Self-pay | Admitting: Hematology and Oncology

## 2023-03-16 ENCOUNTER — Encounter: Payer: Self-pay | Admitting: Hematology and Oncology

## 2023-03-16 ENCOUNTER — Inpatient Hospital Stay (HOSPITAL_BASED_OUTPATIENT_CLINIC_OR_DEPARTMENT_OTHER): Payer: No Typology Code available for payment source | Admitting: Hematology and Oncology

## 2023-03-16 ENCOUNTER — Inpatient Hospital Stay: Payer: No Typology Code available for payment source

## 2023-03-16 ENCOUNTER — Other Ambulatory Visit: Payer: Self-pay

## 2023-03-16 ENCOUNTER — Inpatient Hospital Stay: Payer: No Typology Code available for payment source | Attending: Psychiatry

## 2023-03-16 VITALS — BP 136/83 | HR 81 | Temp 97.4°F | Resp 18 | Ht 60.0 in | Wt 240.0 lb

## 2023-03-16 DIAGNOSIS — C541 Malignant neoplasm of endometrium: Secondary | ICD-10-CM | POA: Insufficient documentation

## 2023-03-16 DIAGNOSIS — Z5112 Encounter for antineoplastic immunotherapy: Secondary | ICD-10-CM | POA: Insufficient documentation

## 2023-03-16 DIAGNOSIS — E119 Type 2 diabetes mellitus without complications: Secondary | ICD-10-CM

## 2023-03-16 LAB — CBC WITH DIFFERENTIAL (CANCER CENTER ONLY)
Abs Immature Granulocytes: 0.11 10*3/uL — ABNORMAL HIGH (ref 0.00–0.07)
Basophils Absolute: 0 10*3/uL (ref 0.0–0.1)
Basophils Relative: 1 %
Eosinophils Absolute: 0.1 10*3/uL (ref 0.0–0.5)
Eosinophils Relative: 1 %
HCT: 39.5 % (ref 36.0–46.0)
Hemoglobin: 12.7 g/dL (ref 12.0–15.0)
Immature Granulocytes: 1 %
Lymphocytes Relative: 35 %
Lymphs Abs: 3 10*3/uL (ref 0.7–4.0)
MCH: 28.3 pg (ref 26.0–34.0)
MCHC: 32.2 g/dL (ref 30.0–36.0)
MCV: 88.2 fL (ref 80.0–100.0)
Monocytes Absolute: 0.4 10*3/uL (ref 0.1–1.0)
Monocytes Relative: 5 %
Neutro Abs: 4.9 10*3/uL (ref 1.7–7.7)
Neutrophils Relative %: 57 %
Platelet Count: 243 10*3/uL (ref 150–400)
RBC: 4.48 MIL/uL (ref 3.87–5.11)
RDW: 14.8 % (ref 11.5–15.5)
WBC Count: 8.7 10*3/uL (ref 4.0–10.5)
nRBC: 0 % (ref 0.0–0.2)

## 2023-03-16 LAB — CMP (CANCER CENTER ONLY)
ALT: 17 U/L (ref 0–44)
AST: 14 U/L — ABNORMAL LOW (ref 15–41)
Albumin: 4 g/dL (ref 3.5–5.0)
Alkaline Phosphatase: 69 U/L (ref 38–126)
Anion gap: 6 (ref 5–15)
BUN: 13 mg/dL (ref 6–20)
CO2: 29 mmol/L (ref 22–32)
Calcium: 8.9 mg/dL (ref 8.9–10.3)
Chloride: 102 mmol/L (ref 98–111)
Creatinine: 0.57 mg/dL (ref 0.44–1.00)
GFR, Estimated: >60 mL/min (ref >60)
Glucose, Bld: 110 mg/dL — ABNORMAL HIGH (ref 70–99)
Potassium: 4 mmol/L (ref 3.5–5.1)
Sodium: 137 mmol/L (ref 135–145)
Total Bilirubin: 0.4 mg/dL (ref 0.0–1.2)
Total Protein: 7.5 g/dL (ref 6.5–8.1)

## 2023-03-16 LAB — TSH: TSH: 82.199 u[IU]/mL — ABNORMAL HIGH (ref 0.350–4.500)

## 2023-03-16 MED ORDER — SODIUM CHLORIDE 0.9% FLUSH
10.0000 mL | INTRAVENOUS | Status: DC | PRN
  Administered 2023-03-16: 10 mL

## 2023-03-16 MED ORDER — SODIUM CHLORIDE 0.9 % IV SOLN: Freq: Once | INTRAVENOUS | Status: AC

## 2023-03-16 MED ORDER — SODIUM CHLORIDE 0.9% FLUSH
10.0000 mL | Freq: Once | INTRAVENOUS | Status: AC
  Administered 2023-03-16: 10 mL

## 2023-03-16 MED ORDER — HEPARIN SOD (PORK) LOCK FLUSH 100 UNIT/ML IV SOLN
500.0000 [IU] | Freq: Once | INTRAVENOUS | Status: AC | PRN
Start: 2023-03-16 — End: 2023-03-16
  Administered 2023-03-16: 500 [IU]

## 2023-03-16 MED ORDER — SODIUM CHLORIDE 0.9 % IV SOLN
1000.0000 mg | Freq: Once | INTRAVENOUS | Status: AC
  Administered 2023-03-16: 1000 mg via INTRAVENOUS
  Filled 2023-03-16: qty 20
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
## 2023-03-16 NOTE — Progress Notes (Signed)
 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  Due to allergic reaction, paclitaxel will discontinue and substituted with docetaxel.  Adjuvant treatment was completed by June 2024 and CT imaging done subsequently showed complete response.  She remained on maintenance immunotherapy without complications  I reviewed imaging study from March 09, 2023 with the patient which show no evidence of recurrence The plan will be to continue maintenance immunotherapy I plan to repeat imaging study again in 6 months, due in September 2025 Obesity, Class III, BMI 40-49.9 (morbid obesity) (HCC) She continues to have progressive weight gain We discussed importance of dietary modification and exercise Type 2 diabetes mellitus without complication, without long-term current use of insulin (HCC) She has poorly controlled diabetes We discussed importance of dietary modification and exercise  Orders Placed This Encounter  Procedures   CBC with Differential (Cancer Center Only)    Standing Status:   Future    Expected Date:   04/27/2023    Expiration Date:   04/26/2024   CMP (Cancer Center only)    Standing Status:   Future    Expected Date:   04/27/2023    Expiration Date:   04/26/2024   T4    Standing Status:   Future    Expected Date:   04/27/2023    Expiration Date:   04/26/2024   TSH    Standing Status:   Future    Expected Date:   04/27/2023    Expiration Date:   04/26/2024   CBC with Differential (Cancer Center Only)    Standing Status:   Future     Expected Date:   06/08/2023    Expiration Date:   06/07/2024   CMP (Cancer Center only)    Standing Status:   Future    Expected Date:   06/08/2023    Expiration Date:   06/07/2024   T4    Standing Status:   Future    Expected Date:   06/08/2023    Expiration Date:   06/07/2024   TSH    Standing Status:   Future    Expected Date:   06/08/2023    Expiration Date:   06/07/2024   CBC with Differential (Cancer Center Only)    Standing Status:   Future    Expected Date:   07/20/2023    Expiration Date:   07/19/2024   CMP (Cancer Center only)    Standing Status:   Future    Expected Date:   07/20/2023    Expiration Date:   07/19/2024   T4    Standing Status:   Future    Expected Date:   07/20/2023    Expiration Date:   07/19/2024   TSH    Standing Status:   Future    Expected Date:   07/20/2023    Expiration Date:   07/19/2024   CBC with Differential (Cancer Center Only)    Standing Status:   Future    Expected Date:   08/31/2023    Expiration Date:   08/30/2024   CMP (Cancer Center only)    Standing Status:   Future  Expected Date:   08/31/2023    Expiration Date:   08/30/2024   T4    Standing Status:   Future    Expected Date:   08/31/2023    Expiration Date:   08/30/2024   TSH    Standing Status:   Future    Expected Date:   08/31/2023    Expiration Date:   08/30/2024   CBC with Differential (Cancer Center Only)    Standing Status:   Future    Expected Date:   10/12/2023    Expiration Date:   10/11/2024   CMP (Cancer Center only)    Standing Status:   Future    Expected Date:   10/12/2023    Expiration Date:   10/11/2024   T4    Standing Status:   Future    Expected Date:   10/12/2023    Expiration Date:   10/11/2024   TSH    Standing Status:   Future    Expected Date:   10/12/2023    Expiration Date:   10/11/2024   CBC with Differential (Cancer Center Only)    Standing Status:   Future    Expected Date:   11/23/2023    Expiration Date:   11/22/2024   CMP (Cancer Center only)    Standing  Status:   Future    Expected Date:   11/23/2023    Expiration Date:   11/22/2024   T4    Standing Status:   Future    Expected Date:   11/23/2023    Expiration Date:   11/22/2024   TSH    Standing Status:   Future    Expected Date:   11/23/2023    Expiration Date:   11/22/2024     Artis Delay, MD  INTERVAL HISTORY: she returns for treatment follow-up Complications related to previous cycle of chemotherapy included none  PHYSICAL EXAMINATION: ECOG PERFORMANCE STATUS: 0 - Asymptomatic  Vitals:   03/16/23 0911  BP: 136/83  Pulse: 81  Resp: 18  Temp: (!) 97.4 F (36.3 C)  SpO2: 96%   Filed Weights   03/16/23 0911  Weight: 240 lb (108.9 kg)    Relevant data reviewed during this visit included CBC, CMP and CT imaging

## 2023-03-16 NOTE — Assessment & Plan Note (Addendum)
 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  Due to allergic reaction, paclitaxel will discontinue and substituted with docetaxel.  Adjuvant treatment was completed by June 2024 and CT imaging done subsequently showed complete response.  She remained on maintenance immunotherapy without complications  I reviewed imaging study from March 09, 2023 with the patient which show no evidence of recurrence The plan will be to continue maintenance immunotherapy I plan to repeat imaging study again in 6 months, due in September 2025

## 2023-03-16 NOTE — Assessment & Plan Note (Addendum)
 She has poorly controlled diabetes We discussed importance of dietary modification and exercise

## 2023-03-16 NOTE — Patient Instructions (Signed)
 [facility] A DEPT OF [facility]  Discharge Instructions: Thank you for choosing [facility] to provide your oncology and hematology care.   If you have a lab appointment with the Cancer Center, please go directly to the Cancer Center and check in at the registration area.   Wear comfortable clothing and clothing appropriate for easy access to any Portacath or PICC line.   We strive to give you quality time with your provider. You may need to reschedule your appointment if you arrive late (15 or more minutes).  Arriving late affects you and other patients whose appointments are after yours.  Also, if you miss three or more appointments without notifying the office, you may be dismissed from the clinic at the provider's discretion.      For prescription refill requests, have your pharmacy contact our office and allow 72 hours for refills to be completed.    Today you received the following chemotherapy and/or immunotherapy agents: Jemperli      To help prevent nausea and vomiting after your treatment, we encourage you to take your nausea medication as directed.  BELOW ARE SYMPTOMS THAT SHOULD BE REPORTED IMMEDIATELY: *FEVER GREATER THAN 100.4 F (38 C) OR HIGHER *CHILLS OR SWEATING *NAUSEA AND VOMITING THAT IS NOT CONTROLLED WITH YOUR NAUSEA MEDICATION *UNUSUAL SHORTNESS OF BREATH *UNUSUAL BRUISING OR BLEEDING *URINARY PROBLEMS (pain or burning when urinating, or frequent urination) *BOWEL PROBLEMS (unusual diarrhea, constipation, pain near the anus) TENDERNESS IN MOUTH AND THROAT WITH OR WITHOUT PRESENCE OF ULCERS (sore throat, sores in mouth, or a toothache) UNUSUAL RASH, SWELLING OR PAIN  UNUSUAL VAGINAL DISCHARGE OR ITCHING   Items with * indicate a potential emergency and should be followed up as soon as possible or go to the Emergency Department if any problems should occur.  Please show the CHEMOTHERAPY ALERT CARD or IMMUNOTHERAPY  ALERT CARD at check-in to the Emergency Department and triage nurse.  Should you have questions after your visit or need to cancel or reschedule your appointment, please contact [facility]  Dept: [phone]  and follow the prompts.  Office hours are 8:00 a.m. to 4:30 p.m. Monday - Friday. Please note that voicemails left after 4:00 p.m. may not be returned until the following business day.  We are closed weekends and major holidays. You have access to a nurse at all times for urgent questions. Please call the main number to the clinic Dept: [phone] and follow the prompts.   For any non-urgent questions, you may also contact your provider using MyChart. We now offer e-Visits for anyone 35 and older to request care online for non-urgent symptoms. For details visit [email or web address].   Also download the MyChart app! Go to the app store, search "MyChart", open the app, select Normal, and log in with your MyChart username and password.

## 2023-03-16 NOTE — Assessment & Plan Note (Addendum)
 She continues to have progressive weight gain We discussed importance of dietary modification and exercise

## 2023-03-17 ENCOUNTER — Other Ambulatory Visit: Payer: Self-pay

## 2023-03-17 ENCOUNTER — Encounter: Payer: Self-pay | Admitting: Hematology and Oncology

## 2023-03-17 ENCOUNTER — Other Ambulatory Visit: Payer: Self-pay | Admitting: Hematology and Oncology

## 2023-03-17 ENCOUNTER — Telehealth: Payer: Self-pay | Admitting: *Deleted

## 2023-03-17 LAB — T4: T4, Total: 2.9 ug/dL — ABNORMAL LOW (ref 4.5–12.0)

## 2023-03-17 MED ORDER — TRULICITY 3 MG/0.5ML ~~LOC~~ SOAJ
3.0000 mg | SUBCUTANEOUS | 3 refills | Status: DC
  Filled 2023-03-17: qty 2, 28d supply, fill #0
  Filled 2023-04-20 – 2023-04-21 (×2): qty 2, 28d supply, fill #1

## 2023-03-17 MED ORDER — LEVOTHYROXINE SODIUM 100 MCG PO TABS
100.0000 ug | ORAL_TABLET | Freq: Every day | ORAL | 1 refills | Status: DC
  Filled 2023-03-17: qty 30, 30d supply, fill #0
  Filled 2023-04-20: qty 30, 30d supply, fill #1

## 2023-03-17 NOTE — Telephone Encounter (Signed)
-----   Message from Artis Delay sent at 03/17/2023  8:27 AM EDT ----- Her TSH is very high I am increasing synthroid to 100 mcg, sent to OP on [address]

## 2023-03-17 NOTE — Telephone Encounter (Signed)
Per Dr.Gorsuch, called pt with message below. Pt verbalized understanding 

## 2023-03-22 ENCOUNTER — Ambulatory Visit: Payer: Self-pay | Admitting: Nurse Practitioner

## 2023-03-23 ENCOUNTER — Other Ambulatory Visit: Payer: Self-pay

## 2023-04-19 ENCOUNTER — Encounter: Payer: Self-pay | Admitting: Hematology and Oncology

## 2023-04-20 ENCOUNTER — Encounter: Payer: Self-pay | Admitting: Hematology and Oncology

## 2023-04-20 ENCOUNTER — Other Ambulatory Visit: Payer: Self-pay

## 2023-04-21 ENCOUNTER — Encounter: Payer: Self-pay | Admitting: Hematology and Oncology

## 2023-04-21 ENCOUNTER — Other Ambulatory Visit: Payer: Self-pay

## 2023-04-22 ENCOUNTER — Encounter: Payer: Self-pay | Admitting: Hematology and Oncology

## 2023-04-23 ENCOUNTER — Other Ambulatory Visit: Payer: Self-pay

## 2023-04-23 ENCOUNTER — Other Ambulatory Visit (HOSPITAL_COMMUNITY): Payer: Self-pay

## 2023-04-27 ENCOUNTER — Inpatient Hospital Stay: Attending: Psychiatry | Admitting: Hematology and Oncology

## 2023-04-27 ENCOUNTER — Encounter: Payer: Self-pay | Admitting: Hematology and Oncology

## 2023-04-27 ENCOUNTER — Inpatient Hospital Stay

## 2023-04-27 VITALS — BP 129/74 | HR 74 | Temp 98.0°F | Resp 18 | Ht 60.0 in | Wt 246.9 lb

## 2023-04-27 DIAGNOSIS — E039 Hypothyroidism, unspecified: Secondary | ICD-10-CM | POA: Insufficient documentation

## 2023-04-27 DIAGNOSIS — C541 Malignant neoplasm of endometrium: Secondary | ICD-10-CM

## 2023-04-27 DIAGNOSIS — Z86718 Personal history of other venous thrombosis and embolism: Secondary | ICD-10-CM | POA: Insufficient documentation

## 2023-04-27 DIAGNOSIS — Z5112 Encounter for antineoplastic immunotherapy: Secondary | ICD-10-CM | POA: Insufficient documentation

## 2023-04-27 DIAGNOSIS — I82C11 Acute embolism and thrombosis of right internal jugular vein: Secondary | ICD-10-CM

## 2023-04-27 DIAGNOSIS — Z79899 Other long term (current) drug therapy: Secondary | ICD-10-CM | POA: Insufficient documentation

## 2023-04-27 DIAGNOSIS — Z7901 Long term (current) use of anticoagulants: Secondary | ICD-10-CM | POA: Insufficient documentation

## 2023-04-27 LAB — CBC WITH DIFFERENTIAL (CANCER CENTER ONLY)
Abs Immature Granulocytes: 0.02 10*3/uL (ref 0.00–0.07)
Basophils Absolute: 0.1 10*3/uL (ref 0.0–0.1)
Basophils Relative: 1 %
Eosinophils Absolute: 0.1 10*3/uL (ref 0.0–0.5)
Eosinophils Relative: 1 %
HCT: 36.8 % (ref 36.0–46.0)
Hemoglobin: 12.3 g/dL (ref 12.0–15.0)
Immature Granulocytes: 0 %
Lymphocytes Relative: 30 %
Lymphs Abs: 2.7 10*3/uL (ref 0.7–4.0)
MCH: 30.1 pg (ref 26.0–34.0)
MCHC: 33.4 g/dL (ref 30.0–36.0)
MCV: 90 fL (ref 80.0–100.0)
Monocytes Absolute: 0.5 10*3/uL (ref 0.1–1.0)
Monocytes Relative: 5 %
Neutro Abs: 5.6 10*3/uL (ref 1.7–7.7)
Neutrophils Relative %: 63 %
Platelet Count: 232 10*3/uL (ref 150–400)
RBC: 4.09 MIL/uL (ref 3.87–5.11)
RDW: 15.9 % — ABNORMAL HIGH (ref 11.5–15.5)
WBC Count: 9 10*3/uL (ref 4.0–10.5)
nRBC: 0 % (ref 0.0–0.2)

## 2023-04-27 LAB — CMP (CANCER CENTER ONLY)
ALT: 30 U/L (ref 0–44)
AST: 27 U/L (ref 15–41)
Albumin: 4.2 g/dL (ref 3.5–5.0)
Alkaline Phosphatase: 60 U/L (ref 38–126)
Anion gap: 6 (ref 5–15)
BUN: 11 mg/dL (ref 6–20)
CO2: 31 mmol/L (ref 22–32)
Calcium: 9.6 mg/dL (ref 8.9–10.3)
Chloride: 101 mmol/L (ref 98–111)
Creatinine: 0.75 mg/dL (ref 0.44–1.00)
GFR, Estimated: >60 mL/min (ref >60)
Glucose, Bld: 106 mg/dL — ABNORMAL HIGH (ref 70–99)
Potassium: 3.9 mmol/L (ref 3.5–5.1)
Sodium: 138 mmol/L (ref 135–145)
Total Bilirubin: 0.4 mg/dL (ref 0.0–1.2)
Total Protein: 7.8 g/dL (ref 6.5–8.1)

## 2023-04-27 LAB — TSH: TSH: 83.9 u[IU]/mL — ABNORMAL HIGH (ref 0.350–4.500)

## 2023-04-27 MED ORDER — HEPARIN SOD (PORK) LOCK FLUSH 100 UNIT/ML IV SOLN
500.0000 [IU] | Freq: Once | INTRAVENOUS | Status: DC | PRN
Start: 2023-04-27 — End: 2023-04-27

## 2023-04-27 MED ORDER — SODIUM CHLORIDE 0.9 % IV SOLN
1000.0000 mg | Freq: Once | INTRAVENOUS | Status: AC
  Administered 2023-04-27: 1000 mg via INTRAVENOUS
  Filled 2023-04-27: qty 20

## 2023-04-27 MED ORDER — SODIUM CHLORIDE 0.9% FLUSH
10.0000 mL | Freq: Once | INTRAVENOUS | Status: AC
  Administered 2023-04-27: 10 mL

## 2023-04-27 MED ORDER — SODIUM CHLORIDE 0.9 % IV SOLN: Freq: Once | INTRAVENOUS | Status: AC

## 2023-04-27 MED ORDER — SODIUM CHLORIDE 0.9% FLUSH: 10.0000 mL | INTRAVENOUS | Status: DC | PRN

## 2023-04-27 NOTE — Patient Instructions (Signed)
 [facility]  Discharge Instructions: Thank you for choosing [facility] to provide your oncology and hematology care.   If you have a lab appointment with the Cancer Center, please go directly to the Cancer Center and check in at the registration area.   Wear comfortable clothing and clothing appropriate for easy access to any Portacath or PICC line.   We strive to give you quality time with your provider. You may need to reschedule your appointment if you arrive late (15 or more minutes).  Arriving late affects you and other patients whose appointments are after yours.  Also, if you miss three or more appointments without notifying the office, you may be dismissed from the clinic at the provider's discretion.      For prescription refill requests, have your pharmacy contact our office and allow 72 hours for refills to be completed.    Today you received the following chemotherapy and/or immunotherapy agents jemperli      To help prevent nausea and vomiting after your treatment, we encourage you to take your nausea medication as directed.  BELOW ARE SYMPTOMS THAT SHOULD BE REPORTED IMMEDIATELY: *FEVER GREATER THAN 100.4 F (38 C) OR HIGHER *CHILLS OR SWEATING *NAUSEA AND VOMITING THAT IS NOT CONTROLLED WITH YOUR NAUSEA MEDICATION *UNUSUAL SHORTNESS OF BREATH *UNUSUAL BRUISING OR BLEEDING *URINARY PROBLEMS (pain or burning when urinating, or frequent urination) *BOWEL PROBLEMS (unusual diarrhea, constipation, pain near the anus) TENDERNESS IN MOUTH AND THROAT WITH OR WITHOUT PRESENCE OF ULCERS (sore throat, sores in mouth, or a toothache) UNUSUAL RASH, SWELLING OR PAIN  UNUSUAL VAGINAL DISCHARGE OR ITCHING   Items with * indicate a potential emergency and should be followed up as soon as possible or go to the Emergency Department if any problems should occur.  Please show the CHEMOTHERAPY ALERT CARD or IMMUNOTHERAPY  ALERT CARD at check-in to the Emergency Department and triage nurse.  Should you have questions after your visit or need to cancel or reschedule your appointment, please contact [facility]  Dept: [phone]  and follow the prompts.  Office hours are 8:00 a.m. to 4:30 p.m. Monday - Friday. Please note that voicemails left after 4:00 p.m. may not be returned until the following business day.  We are closed weekends and major holidays. You have access to a nurse at all times for urgent questions. Please call the main number to the clinic Dept: [phone] and follow the prompts.   For any non-urgent questions, you may also contact your provider using MyChart. We now offer e-Visits for anyone 51 and older to request care online for non-urgent symptoms. For details visit [email or web address].   Also download the MyChart app! Go to the app store, search "MyChart", open the app, select [facility], and log in with your MyChart username and password.

## 2023-04-28 ENCOUNTER — Encounter: Payer: Self-pay | Admitting: Hematology and Oncology

## 2023-04-28 ENCOUNTER — Telehealth: Payer: Self-pay

## 2023-04-28 ENCOUNTER — Other Ambulatory Visit: Payer: Self-pay

## 2023-04-28 DIAGNOSIS — E039 Hypothyroidism, unspecified: Secondary | ICD-10-CM | POA: Insufficient documentation

## 2023-04-28 LAB — T4: T4, Total: 1.1 ug/dL — ABNORMAL LOW (ref 4.5–12.0)

## 2023-04-28 MED ORDER — LEVOTHYROXINE SODIUM 150 MCG PO TABS
150.0000 ug | ORAL_TABLET | Freq: Every day | ORAL | 3 refills | Status: DC
  Filled 2023-04-28: qty 30, 30d supply, fill #0
  Filled 2023-06-07: qty 30, 30d supply, fill #1

## 2023-04-28 NOTE — Assessment & Plan Note (Addendum)
 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  Due to allergic reaction, paclitaxel  will discontinue and substituted with docetaxel .  Adjuvant treatment was completed by June 2024 and CT imaging done subsequently showed complete response.  She remained on maintenance immunotherapy without complications  I reviewed imaging study from March 09, 2023 with the patient which show no evidence of recurrence Treatment course is complicated by excessive weight gain, multifactorial with component contributed by severe hypothyroidism The plan will be to continue maintenance immunotherapy I plan to repeat imaging study again in 6 months, due in September 2025

## 2023-04-28 NOTE — Telephone Encounter (Signed)
 Called pt to advise per MD. She verbalized understanding and agreement. She knows to call with any questions/concerns.

## 2023-04-28 NOTE — Progress Notes (Signed)
 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  Due to allergic reaction, paclitaxel  will discontinue and substituted with docetaxel .  Adjuvant treatment was completed by June 2024 and CT imaging done subsequently showed complete response.  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) She is doing well on anticoagulation therapy  Recommend she continues the same Acquired hypothyroidism She is compliant taking her thyroid  medicine Despite that, her TSH remain almost unchanged, very high in comparison to her prior TSH I plan to increase creased the dose of Synthroid  to 150 mcg  we will continue close monitoring with her next visit Obesity, Class III, BMI 40-49.9 (morbid obesity) (HCC) She has significant progressive weight gain I went through her diet and discussed dietary modification and encouraged her to increase physical activity as tolerated  No orders of the defined types were placed in this encounter.    Maureen Jacobs, MD  INTERVAL HISTORY: she returns for treatment follow-up Complications related to previous cycle of  chemotherapy included  progressive weight gain and abnormal thyroid  function She is concerned about her progressive weight gain We discussed her oral intake and food choices She tried an attempt to exercise as tolerated  PHYSICAL EXAMINATION: ECOG PERFORMANCE STATUS: 0 - Asymptomatic  Vitals:   04/27/23 0838  BP: 129/74  Pulse: 74  Resp: 18  Temp: 98 F (36.7 C)  SpO2: 98%   Filed Weights   04/27/23 0838  Weight: 246 lb 14.4 oz (112 kg)    Relevant data reviewed during this visit included CBC, CMP and TSH

## 2023-04-28 NOTE — Assessment & Plan Note (Addendum)
 She has significant progressive weight gain I went through her diet and discussed dietary modification and encouraged her to increase physical activity as tolerated

## 2023-04-28 NOTE — Assessment & Plan Note (Addendum)
 She is doing well on anticoagulation therapy Recommend she continues the same

## 2023-04-28 NOTE — Assessment & Plan Note (Addendum)
 She is compliant taking her thyroid  medicine Despite that, her TSH remain almost unchanged, very high in comparison to her prior TSH I plan to increase creased the dose of Synthroid  to 150 mcg  we will continue close monitoring with her next visit

## 2023-04-28 NOTE — Telephone Encounter (Signed)
-----   Message from Almeda Jacobs sent at 04/28/2023  8:26 AM EDT ----- Hi,  Can you call her and let her know TSH is very high, still I sent new prescription of 150 mcg synthroid  to her pharmacy Remind her to take 30 mins before breakfast

## 2023-04-30 ENCOUNTER — Ambulatory Visit: Payer: Self-pay | Admitting: Nurse Practitioner

## 2023-05-03 ENCOUNTER — Ambulatory Visit (INDEPENDENT_AMBULATORY_CARE_PROVIDER_SITE_OTHER): Payer: Self-pay | Admitting: Nurse Practitioner

## 2023-05-03 ENCOUNTER — Encounter: Payer: Self-pay | Admitting: Hematology and Oncology

## 2023-05-03 ENCOUNTER — Other Ambulatory Visit: Payer: Self-pay

## 2023-05-03 ENCOUNTER — Encounter: Payer: Self-pay | Admitting: Nurse Practitioner

## 2023-05-03 VITALS — BP 128/82 | HR 91 | Temp 97.0°F | Wt 248.0 lb

## 2023-05-03 DIAGNOSIS — R490 Dysphonia: Secondary | ICD-10-CM | POA: Insufficient documentation

## 2023-05-03 DIAGNOSIS — E039 Hypothyroidism, unspecified: Secondary | ICD-10-CM

## 2023-05-03 DIAGNOSIS — E785 Hyperlipidemia, unspecified: Secondary | ICD-10-CM

## 2023-05-03 DIAGNOSIS — R21 Rash and other nonspecific skin eruption: Secondary | ICD-10-CM | POA: Insufficient documentation

## 2023-05-03 DIAGNOSIS — I10 Essential (primary) hypertension: Secondary | ICD-10-CM

## 2023-05-03 DIAGNOSIS — M25561 Pain in right knee: Secondary | ICD-10-CM | POA: Insufficient documentation

## 2023-05-03 DIAGNOSIS — E119 Type 2 diabetes mellitus without complications: Secondary | ICD-10-CM

## 2023-05-03 LAB — POCT GLYCOSYLATED HEMOGLOBIN (HGB A1C): Hemoglobin A1C: 6.5 % — AB (ref 4.0–5.6)

## 2023-05-03 MED ORDER — TRIAMCINOLONE ACETONIDE 0.1 % EX CREA
1.0000 | TOPICAL_CREAM | Freq: Two times a day (BID) | CUTANEOUS | 1 refills | Status: DC
  Filled 2023-05-03: qty 30, 15d supply, fill #0

## 2023-05-03 MED ORDER — PANTOPRAZOLE SODIUM 40 MG PO TBEC
40.0000 mg | DELAYED_RELEASE_TABLET | Freq: Every day | ORAL | 0 refills | Status: DC
Start: 2023-05-03 — End: 2023-09-07
  Filled 2023-05-03: qty 5, 5d supply, fill #0

## 2023-05-03 MED ORDER — KETOROLAC TROMETHAMINE 30 MG/ML IJ SOLN
30.0000 mg | Freq: Once | INTRAMUSCULAR | Status: AC
Start: 2023-05-03 — End: 2023-05-03
  Administered 2023-05-03: 30 mg via INTRAMUSCULAR

## 2023-05-03 MED ORDER — METHYLPREDNISOLONE 4 MG PO TBPK
ORAL_TABLET | ORAL | 0 refills | Status: DC
  Filled 2023-05-03: qty 21, 6d supply, fill #0

## 2023-05-03 NOTE — Assessment & Plan Note (Signed)
 Lab Results  Component Value Date   TSH 83.900 (H) 04/27/2023  Encouraged to pick up levothyroxine  150 mcg tablet and take daily on empty stomach as ordered Hematologist plans on checking her labs at next visit

## 2023-05-03 NOTE — Assessment & Plan Note (Addendum)
 Continue valsartan  80mg  daily  DASH diet and commitment to daily physical activity for a minimum of 30 minutes discussed and encouraged, as a part of hypertension management. The importance of attaining a healthy weight is also discussed.     05/03/2023    3:54 PM 04/27/2023    8:38 AM 03/16/2023    9:11 AM 02/02/2023    9:05 AM 01/05/2023   11:45 AM 01/05/2023   11:00 AM 12/22/2022    8:31 AM  BP/Weight  Systolic BP 128 129 136 146 134 130 162  Diastolic BP 82 74 83 94 86 88 82  Wt. (Lbs) 248 246.9 240 233.6  234 233.6  BMI 48.43 kg/m2 48.22 kg/m2 46.87 kg/m2 45.62 kg/m2  45.7 kg/m2 45.62 kg/m2

## 2023-05-03 NOTE — Progress Notes (Addendum)
 Established Patient Office Visit  Subjective:  Patient ID: Maureen Campos, female    DOB: 06/19/1970  Age: 53 y.o. MRN: [id number]  CC:  Chief Complaint  Patient presents with   Diabetes    HPI Maureen Campos is a 53 y.o. 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  Patient presents for follow-up for her chronic medical conditions  Type 2 diabetes.  Currently on Trulicity  3 mg once weekly injection, metformin  1000 mg twice daily.  She has been following a low-carb diet.  No complaints of polyuria polydipsia polyphagia  Hypothyroidism.  Levothyroxine  was recently increased to 150 mcg daily by oncology but she has not started the medication.  Acute right knee pain.  Patient complains of right knee pain that started after cleaning out her closet about 2 weeks ago, she has been taking Tylenol  as needed without much relief, stated that she had trouble walking over the weekend.  Her pain is currently rated 7/10  Hoarseness.  Patient complains of hoarseness that started over a month ago.  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)    Diabetes mellitus without complication (HCC)    Dyspnea    with activity   Endometrial cancer (HCC)    Family history of colon cancer 03/04/2022   Family history of uterine cancer    Fast heart beat    with activity   Fatigue    Hearing loss    Left ear   History of kidney stones    12  years ago   Lazy eye, left    Left leg numbness    occ   Migraine    occ    Past Surgical History:  Procedure Laterality Date   CHOLECYSTECTOMY  2011   IR CHEST FLUORO  04/07/2022   IR CV LINE INJECTION  04/09/2022   IR IMAGING GUIDED PORT INSERTION  03/05/2022   IR PORT REPAIR CENTRAL VENOUS ACCESS DEVICE  04/09/2022   IR US  GUIDE VASC ACCESS RIGHT  04/09/2022   ROBOTIC ASSISTED TOTAL HYSTERECTOMY WITH BILATERAL SALPINGO OOPHERECTOMY N/A 02/10/2022   Procedure: XI ROBOTIC ASSISTED TOTAL HYSTERECTOMY WITH BILATERAL SALPINGO OOPHORECTOMY;  Surgeon: Derrel Flies, MD;  Location: [facility];  Service: Gynecology;  Laterality: N/A;   ROBOTIC PELVIC AND PARA-AORTIC LYMPH NODE DISSECTION N/A 02/10/2022   Procedure: XI ROBOTIC PELVIC AND PARA-AORTIC SENTINEL LYMPH NODE DISSECTION;  Surgeon: Derrel Flies, MD;  Location: [facility];  Service: Gynecology;  Laterality: N/A;    Family History  Problem Relation Age of Onset   Colon cancer Father 75   Uterine cancer Sister 38   Melanoma Sister 11   Cancer Paternal Aunt        possible brain cancer, dx > 50   Breast cancer Neg Hx    Ovarian cancer Neg Hx    Endometrial cancer Neg Hx    Pancreatic cancer Neg Hx    Prostate cancer Neg Hx     Social History   Socioeconomic History  Marital status: Significant Other    Spouse name: Not on file   Number of children: 4   Years of education: Not on file   Highest education level: Not on file  Occupational History   Not on file  Tobacco Use   Smoking status: Never   Smokeless tobacco: Never  Vaping Use   Vaping status: Never Used  Substance and Sexual Activity   Alcohol use: Not Currently    Comment: every weekend   Drug use: No   Sexual activity: Yes    Birth control/protection: None  Other Topics Concern   Not on file  Social History Narrative   Lives with her daughter   Social Drivers of Health   Financial Resource Strain: Not on file  Food Insecurity: Food Insecurity Present  (07/28/2022)   Hunger Vital Sign    Worried About Running Out of Food in the Last Year: Sometimes true    Ran Out of Food in the Last Year: Sometimes true  Transportation Needs: No Transportation Needs (07/29/2022)   PRAPARE - Administrator, Civil Service (Medical): No    Lack of Transportation (Non-Medical): No  Physical Activity: Not on file  Stress: Not on file  Social Connections: Not on file  Intimate Partner Violence: Not At Risk (07/29/2022)   Humiliation, Afraid, Rape, and Kick questionnaire    Fear of Current or Ex-Partner: No    Emotionally Abused: No    Physically Abused: No    Sexually Abused: No    Outpatient Medications Prior to Visit  Medication Sig Dispense Refill   apixaban  (ELIQUIS ) 5 MG TABS tablet Take 1 tablet (5 mg total) by mouth 2 (two) times daily. 180 tablet 3   Blood Glucose Monitoring Suppl DEVI 1 each by Does not apply route in the morning, at noon, and at bedtime. May substitute to any manufacturer covered by patient's insurance. 1 each 0   levothyroxine  (SYNTHROID ) 150 MCG tablet Take 1 tablet (150 mcg total) by mouth daily before breakfast. 30 tablet 3   lidocaine -prilocaine  (EMLA ) cream Apply to affected area once 30 g 3   metFORMIN  (GLUCOPHAGE ) 1000 MG tablet Take 1 tablet (1,000 mg total) by mouth 2 (two) times daily with a meal. 180 tablet 3   valsartan  (DIOVAN ) 80 MG tablet Take 1 tablet (80 mg total) by mouth daily. 30 tablet 6   atorvastatin  (LIPITOR) 40 MG tablet Take 1 tablet (40 mg total) by mouth daily. 90 tablet 1   Dulaglutide  (TRULICITY ) 3 MG/0.5ML SOAJ Inject 3 mg into the skin once a week. 2 mL 3   cyclobenzaprine  (FLEXERIL ) 5 MG tablet Take 1 tablet (5 mg total) by mouth 3 (three) times daily as needed. (Patient not taking: Reported on 05/03/2023) 30 tablet 0   ondansetron  (ZOFRAN ) 8 MG tablet Take 1 tablet (8 mg total) by mouth every 8 (eight) hours as needed for nausea or vomiting. Start on the third day after chemotherapy.  (Patient not taking: Reported on 01/04/2023) 30 tablet 1   prochlorperazine  (COMPAZINE ) 10 MG tablet Take 1 tablet (10 mg total) by mouth every 6 (six) hours as needed for nausea or vomiting. (Patient not taking: Reported on 05/03/2023) 30 tablet 1   No facility-administered medications prior to visit.    Allergies  Allergen Reactions   Paclitaxel  Shortness Of Breath and Other (See Comments)    See progress note from 04/10/22   Advil  [Ibuprofen ] Itching and Rash    ROS Review of Systems  Constitutional:  Negative for appetite change, chills, diaphoresis and fever.  HENT:  Negative for congestion, postnasal drip, rhinorrhea and sneezing.   Respiratory:  Negative for cough, shortness of breath and wheezing.   Cardiovascular:  Negative for chest pain, palpitations and leg swelling.  Gastrointestinal:  Negative for abdominal pain, constipation, nausea and vomiting.  Genitourinary:  Negative for difficulty urinating, dysuria, flank pain and frequency.  Musculoskeletal:  Positive for arthralgias. Negative for back pain, joint swelling and myalgias.  Skin:  Positive for rash. Negative for color change, pallor and wound.  Neurological:  Negative for dizziness, facial asymmetry, weakness, numbness and headaches.  Psychiatric/Behavioral:  Negative for behavioral problems, confusion, self-injury and suicidal ideas.       Objective:       Physical Exam Vitals and nursing note reviewed.  Constitutional:      General: She is not in acute distress.    Appearance: Normal appearance. She is obese. She is not ill-appearing, toxic-appearing or diaphoretic.  Eyes:     General: No scleral icterus.       Right eye: No discharge.        Left eye: No discharge.     Extraocular Movements: Extraocular movements intact.     Conjunctiva/sclera: Conjunctivae normal.  Cardiovascular:     Rate and Rhythm: Normal rate and regular rhythm.     Pulses: Normal pulses.     Heart sounds: Normal heart sounds.  No murmur heard.    No friction rub. No gallop.  Pulmonary:     Effort: Pulmonary effort is normal. No respiratory distress.     Breath sounds: Normal breath sounds. No stridor. No wheezing, rhonchi or rales.  Chest:     Chest wall: No tenderness.  Abdominal:     General: There is no distension.     Palpations: Abdomen is soft.     Tenderness: There is no abdominal tenderness. There is no right CVA tenderness, left CVA tenderness or guarding.  Musculoskeletal:        General: Tenderness present. No swelling, deformity or signs of injury.     Right lower leg: No edema.     Left lower leg: No edema.     Comments: Tenderness on range of motion of right knee , no redness or swelling noted ,patient limping to walk.  Skin:    General: Skin is warm and dry.     Capillary Refill: Capillary refill takes less than 2 seconds.     Coloration: Skin is not jaundiced or pale.     Findings: Rash present. No bruising, erythema or lesion.  Neurological:     Mental Status: She is alert and oriented to person, place, and time.     Motor: No weakness.     Gait: Gait abnormal.  Psychiatric:        Mood and Affect: Mood normal.        Behavior: Behavior normal.        Thought Content: Thought content normal.        Judgment: Judgment normal.     BP 128/82   Pulse 91   Temp (!) 97 F (36.1 C)   Wt 248 lb (112.5 kg)   LMP 01/06/2022 Comment: Hysterectomy 02/10/22  SpO2 97%   BMI 48.43 kg/m  Wt Readings from Last 3 Encounters:  05/03/23 248 lb (112.5 kg)  04/27/23 246 lb 14.4 oz (112 kg)  03/16/23 240 lb (108.9 kg)    Lab Results  Component Value Date   TSH  83.900 (H) 04/27/2023   Lab Results  Component Value Date   WBC 9.0 04/27/2023   HGB 12.3 04/27/2023   HCT 36.8 04/27/2023   MCV 90.0 04/27/2023   PLT 232 04/27/2023   Lab Results  Component Value Date   NA 138 04/27/2023   K 3.9 04/27/2023   CO2 31 04/27/2023   GLUCOSE 106 (H) 04/27/2023   BUN 11 04/27/2023   CREATININE  0.75 04/27/2023   BILITOT 0.4 04/27/2023   ALKPHOS 60 04/27/2023   AST 27 04/27/2023   ALT 30 04/27/2023   PROT 7.8 04/27/2023   ALBUMIN 4.2 04/27/2023   CALCIUM  9.6 04/27/2023   ANIONGAP 6 04/27/2023   EGFR 112 02/08/2023   Lab Results  Component Value Date   CHOL 220 (H) 05/04/2023   Lab Results  Component Value Date   HDL 43 05/04/2023   Lab Results  Component Value Date   LDLCALC 131 (H) 05/04/2023   Lab Results  Component Value Date   TRIG 258 (H) 05/04/2023   Lab Results  Component Value Date   CHOLHDL 5.1 (H) 05/04/2023   Lab Results  Component Value Date   HGBA1C 6.5 (A) 05/03/2023      Assessment & Plan:   Problem List Items Addressed This Visit       Cardiovascular and Mediastinum   High blood pressure   Continue valsartan  80mg  daily  DASH diet and commitment to daily physical activity for a minimum of 30 minutes discussed and encouraged, as a part of hypertension management. The importance of attaining a healthy weight is also discussed.     05/03/2023    3:54 PM 04/27/2023    8:38 AM 03/16/2023    9:11 AM 02/02/2023    9:05 AM 01/05/2023   11:45 AM 01/05/2023   11:00 AM 12/22/2022    8:31 AM  BP/Weight  Systolic BP 128 129 136 146 134 130 162  Diastolic BP 82 74 83 94 86 88 82  Wt. (Lbs) 248 246.9 240 233.6  234 233.6  BMI 48.43 kg/m2 48.22 kg/m2 46.87 kg/m2 45.62 kg/m2  45.7 kg/m2 45.62 kg/m2             Endocrine   Diabetes mellitus type 2, uncomplicated (HCC) - Primary   Lab Results  Component Value Date   HGBA1C 6.5 (A) 05/03/2023  Chronic medical condition currently well-controlled Continue metformin  1000 mg twice daily, Trulicity   3mg  weekly Patient counseled on low-carb diet, engage in regular moderate exercises at least  150 minutes weekly as tolerated Encouraged to get her diabetes exam completed Appreciate collaboration with the clinical pharmacist Follow-up in 4 months      Relevant Orders   POCT glycosylated  hemoglobin (Hb A1C) (Completed)   Acquired hypothyroidism   Lab Results  Component Value Date   TSH 83.900 (H) 04/27/2023  Encouraged to pick up levothyroxine  150 mcg tablet and take daily on empty stomach as ordered Hematologist plans on checking her labs at next visit        Musculoskeletal and Integument   Rash and other nonspecific skin eruption   Has itchy rashes on right arm and left lower extremities She has been keeping skin well moisturized, this helps a little bit Start Kenalog  0.1 mg cream apply twice daily for 2 weeks and then as needed Continue to keep skin well moisturized      Relevant Medications   triamcinolone  cream (KENALOG ) 0.1 %     Other   Obesity, Class III,  BMI 40-49.9 (morbid obesity)   Wt Readings from Last 3 Encounters:  05/03/23 248 lb (112.5 kg)  04/27/23 246 lb 14.4 oz (112 kg)  03/16/23 240 lb (108.9 kg)   Body mass index is 48.43 kg/m.  continues to add weight counseled on low-carb diet encouraged to engage in regular moderate exercises as tolerated On Trulicity  for type 2 diabetes Uncontrolled hypothyroidism could be contributing to this, and her to pick up the prescription for levothyroxine  150 mcg tablets and take as ordered      Dyslipidemia, goal LDL below 70   Lab Results  Component Value Date   CHOL 167 11/24/2022   HDL 43 11/24/2022   LDLCALC 96 11/24/2022   LDLDIRECT 75 01/05/2023   TRIG 163 (H) 11/24/2022   CHOLHDL 3.9 11/24/2022  On atorvastatin  40 mg daily Checking lipid panel LDL goal is less than 70      Relevant Orders   Lipid panel (Completed)   Hoarseness of voice   Relevant Orders   Ambulatory referral to ENT   Acute pain of right knee   Methylprednisolone  125 milligram one-time injection, Toradol  30 mg one-time injection given in the office today Start a Short course of steroid tomorrow, take with pantoprazole  40 mg daily to prevent GI bleed Encouraged to take Tylenol  650 mg every 6 hours as needed use of knee  brace, heating pad and stretching exercises encouraged Will obtain a knee x-ray if symptom does not improve      Relevant Medications   methylPREDNISolone  (MEDROL  DOSEPAK) 4 MG TBPK tablet   pantoprazole  (PROTONIX ) 40 MG tablet    Meds ordered this encounter  Medications   triamcinolone  cream (KENALOG ) 0.1 %    Sig: Apply 1 Application topically 2 (two) times daily.    Dispense:  30 g    Refill:  1   methylPREDNISolone  (MEDROL  DOSEPAK) 4 MG TBPK tablet    Sig: Take as instructed on the packaging    Dispense:  21 tablet    Refill:  0   pantoprazole  (PROTONIX ) 40 MG tablet    Sig: Take 1 tablet (40 mg total) by mouth daily.    Dispense:  5 tablet    Refill:  0   ketorolac  (TORADOL ) 30 MG/ML injection 30 mg    Follow-up: Return in about 4 months (around 09/02/2023) for DM, HTN, HYPERLIPIDEMIA, FASTING LABS THIS WEEK.    Novah Nessel R Kamica Florance, FNP

## 2023-05-03 NOTE — Assessment & Plan Note (Addendum)
 Wt Readings from Last 3 Encounters:  05/03/23 248 lb (112.5 kg)  04/27/23 246 lb 14.4 oz (112 kg)  03/16/23 240 lb (108.9 kg)   Body mass index is 48.43 kg/m.  continues to add weight counseled on low-carb diet encouraged to engage in regular moderate exercises as tolerated On Trulicity  for type 2 diabetes Uncontrolled hypothyroidism could be contributing to this, and her to pick up the prescription for levothyroxine  150 mcg tablets and take as ordered

## 2023-05-03 NOTE — Assessment & Plan Note (Signed)
 Methylprednisolone  125 milligram one-time injection, Toradol  30 mg one-time injection given in the office today Start a Short course of steroid tomorrow, take with pantoprazole 40 mg daily to prevent GI bleed Encouraged to take Tylenol  650 mg every 6 hours as needed use of knee brace, heating pad and stretching exercises encouraged Will obtain a knee x-ray if symptom does not improve

## 2023-05-03 NOTE — Assessment & Plan Note (Addendum)
 Lab Results  Component Value Date   HGBA1C 6.5 (A) 05/03/2023  Chronic medical condition currently well-controlled Continue metformin  1000 mg twice daily, Trulicity   3mg  weekly Patient counseled on low-carb diet, engage in regular moderate exercises at least  150 minutes weekly as tolerated Encouraged to get her diabetes exam completed Appreciate collaboration with the clinical pharmacist Follow-up in 4 months

## 2023-05-03 NOTE — Assessment & Plan Note (Signed)
 Has itchy rashes on right arm and left lower extremities She has been keeping skin well moisturized, this helps a little bit Start Kenalog 0.1 mg cream apply twice daily for 2 weeks and then as needed Continue to keep skin well moisturized

## 2023-05-03 NOTE — Patient Instructions (Addendum)
 Please consider getting Pneumococcal  and Tdap vaccine at local pharmacy.     . Rash and other nonspecific skin eruption  - triamcinolone cream (KENALOG) 0.1 %; Apply 1 Application topically 2 (two) times daily.  Dispense: 30 g; Refill: 1  . Acute pain of right knee  - methylPREDNISolone  (MEDROL  DOSEPAK) 4 MG TBPK tablet; Take as instructed on the packaging  Dispense: 1 each; Refill: 0 - pantoprazole (PROTONIX) 40 MG tablet; Take 1 tablet (40 mg total) by mouth daily.  Dispense: 5 tablet; Refill: 0   Hoarseness of voice  Ambulatory referral to ENT     It is important that you exercise regularly at least 30 minutes 5 times a week as tolerated  Think about what you will eat, plan ahead. Choose " clean, green, fresh or frozen" over canned, processed or packaged foods which are more sugary, salty and fatty. 70 to 75% of food eaten should be vegetables and fruit. Three meals at set times with snacks allowed between meals, but they must be fruit or vegetables. Aim to eat over a 12 hour period , example 7 am to 7 pm, and STOP after  your last meal of the day. Drink water ,generally about 64 ounces per day, no other drink is as healthy. Fruit juice is best enjoyed in a healthy way, by EATING the fruit.  Thanks for choosing [facility] we consider it a privelige to serve you.

## 2023-05-03 NOTE — Assessment & Plan Note (Addendum)
 Lab Results  Component Value Date   CHOL 167 11/24/2022   HDL 43 11/24/2022   LDLCALC 96 11/24/2022   LDLDIRECT 75 01/05/2023   TRIG 163 (H) 11/24/2022   CHOLHDL 3.9 11/24/2022  On atorvastatin  40 mg daily Checking lipid panel LDL goal is less than 70

## 2023-05-04 ENCOUNTER — Other Ambulatory Visit: Payer: Self-pay

## 2023-05-04 DIAGNOSIS — E785 Hyperlipidemia, unspecified: Secondary | ICD-10-CM

## 2023-05-05 ENCOUNTER — Other Ambulatory Visit: Payer: Self-pay | Admitting: Nurse Practitioner

## 2023-05-05 ENCOUNTER — Encounter: Payer: Self-pay | Admitting: Hematology and Oncology

## 2023-05-05 ENCOUNTER — Other Ambulatory Visit: Payer: Self-pay

## 2023-05-05 DIAGNOSIS — E785 Hyperlipidemia, unspecified: Secondary | ICD-10-CM

## 2023-05-05 LAB — LIPID PANEL
Chol/HDL Ratio: 5.1 ratio — ABNORMAL HIGH (ref 0.0–4.4)
Cholesterol, Total: 220 mg/dL — ABNORMAL HIGH (ref 100–199)
HDL: 43 mg/dL (ref >39)
LDL Chol Calc (NIH): 131 mg/dL — ABNORMAL HIGH (ref 0–99)
Triglycerides: 258 mg/dL — ABNORMAL HIGH (ref 0–149)
VLDL Cholesterol Cal: 46 mg/dL — ABNORMAL HIGH (ref 5–40)

## 2023-05-05 MED ORDER — ATORVASTATIN CALCIUM 80 MG PO TABS
80.0000 mg | ORAL_TABLET | Freq: Every day | ORAL | 3 refills | Status: DC
  Filled 2023-05-05: qty 90, 90d supply, fill #0
  Filled 2023-09-07: qty 90, 90d supply, fill #1

## 2023-05-06 ENCOUNTER — Other Ambulatory Visit: Payer: Self-pay

## 2023-05-11 ENCOUNTER — Other Ambulatory Visit: Payer: Self-pay

## 2023-05-11 MED ORDER — TRULICITY 4.5 MG/0.5ML ~~LOC~~ SOAJ
4.5000 mg | SUBCUTANEOUS | 5 refills | Status: DC
  Filled 2023-05-11: qty 2, 28d supply, fill #0
  Filled 2023-06-07: qty 2, 28d supply, fill #1
  Filled 2023-07-05 (×2): qty 2, 28d supply, fill #2
  Filled 2023-08-09: qty 2, 28d supply, fill #3
  Filled 2023-09-03: qty 2, 28d supply, fill #4
  Filled 2023-09-21 – 2023-09-28 (×2): qty 2, 28d supply, fill #5

## 2023-05-11 NOTE — Progress Notes (Unsigned)
 05/11/2023 Name: Maureen Campos MRN: [id number] DOB: [birth year]  Chief Complaint  Patient presents with   Diabetes   Hypertension   Hyperlipidemia    Maureen Campos is a 53 y.o. year old female who presented for a telephone visit.   They were referred to the pharmacist by their PCP for assistance in managing diabetes.    Subjective: Patient reports she is going well. Feeling much better now that her blood sugar has improved and is able to do more activities, although limited by back pain. She notes she thinks her weight has started to stabilize after recent weight gain although does not have a scale at home to monitor this. Hypothyroidism is likely contributing to weight gain and levothyroxine  was recently increased to 150 mcg daily by oncology which was filled on 05/03/23.  Care Team: Primary Care Provider: [name], [name], FNP ; Next Scheduled Visit: 06/22/23 Oncologist: Dr. Marton Sleeper  Medication Access/Adherence  Current Pharmacy:  [facility] [address], [address] [place] [address] Phone: [phone] Fax: [phone]   Patient reports affordability concerns with their medications: Yes - getting Trulicity  via [facility] supply Patient reports access/transportation concerns to their pharmacy: No  Patient reports adherence concerns with their medications:  No  - reports previously she was missing some doses of medications, but ~1-2 months ago she started utilizing a pill box and adherence has improved  Diabetes:  Current medications: Trulicity  3 mg weekly (Tuesdays), metformin  1000 mg BID  Denies n/v, diarrhea, constipation with Trulicity . Daughter was previously assisting with injections due to her needle phobia, but now patient reports she has learned to inject herself. She would like to increase Trulicity  if able to help with weight loss.  Using glucometer; testing a few times per week Fasting home BG:  114-120  Patient denies hypoglycemic s/sx including dizziness, shakiness, sweating. Patient denies hyperglycemic symptoms including polyuria, polydipsia, polyphagia, nocturia, neuropathy, blurred vision.  Current meal patterns:  Eats 2 meals/day, but does snack during the day - Breakfast: boiled egg - Lunch: eggs, beans, cheese, guacamole, small tortillas - Supper: beans, meat, onion  - Snacks: vegetables, fruits - Drinks: water   Current physical activity: limited by back pain, but tries to go for a 10-15 minute walk every couple of days  Current medication access support: Trulicity  via [employer] supply  Hypertension:  Current medications: valsartan  80 mg daily  Patient has a validated, automated, upper arm home BP cuff Current blood pressure readings readings: SBP 130-140, cannot recall DBP (not resting prior to checking)  Patient reports hypotensive s/sx including dizziness - usually first thing in the morning when getting out of bed or when changing positions. Patient denies hypertensive symptoms including headache, chest pain, shortness of breath   Hyperlipidemia/ASCVD Risk Reduction  Current lipid lowering medications: atorvastatin  80 mg daily  Reports she is taking 2 tablets of atorvastatin  40 mg. Believes she was fasting at time of last lipid panel.  Family History: no known cardiac hx Risk Factors: DM HTN  The 10-year ASCVD risk score (Arnett DK, et al., 2019) is: 5.3%   Values used to calculate the score:     Age: 40 years     Sex: Female     Is Non-Hispanic African American: No     Diabetic: Yes     Tobacco smoker: No     Systolic Blood Pressure: 128 mmHg     Is BP treated: Yes     HDL Cholesterol:  43 mg/dL     Total Cholesterol: 220 mg/dL  Objective:     1/60/1093    3:54 PM 04/27/2023    8:38 AM 03/16/2023    9:11 AM  Vitals with BMI  Height  5\' 0"  5\' 0"   Weight 248 lbs 246 lbs 14 oz 240 lbs  BMI 48.43 48.22 46.87  Systolic 128  129 136  Diastolic 82 74 83  Pulse 91 74 81     Lab Results  Component Value Date   HGBA1C 6.5 (A) 05/03/2023    Lab Results  Component Value Date   CREATININE 0.75 04/27/2023   BUN 11 04/27/2023   NA 138 04/27/2023   K 3.9 04/27/2023   CL 101 04/27/2023   CO2 31 04/27/2023    Lab Results  Component Value Date   CHOL 220 (H) 05/04/2023   HDL 43 05/04/2023   LDLCALC 131 (H) 05/04/2023   LDLDIRECT 75 01/05/2023   TRIG 258 (H) 05/04/2023   CHOLHDL 5.1 (H) 05/04/2023    Medications Reviewed Today     Reviewed by Philmore Bream, RPH (Pharmacist) on 05/11/23 at 1840  Med List Status: <None>   Medication Order Taking? Sig Documenting Provider Last Dose Status Informant  apixaban  (ELIQUIS ) 5 MG TABS tablet [id number]  Take 1 tablet (5 mg total) by mouth 2 (two) times daily. Almeda Jacobs, MD  Active   atorvastatin  (LIPITOR) 80 MG tablet [id number] Yes Take 1 tablet (80 mg total) by mouth daily. [name], [name], FNP Taking Active   Blood Glucose Monitoring Suppl DEVI [id number]  1 each by Does not apply route in the morning, at noon, and at bedtime. May substitute to any manufacturer covered by patient's insurance. [name], [name], FNP  Active   cyclobenzaprine  (FLEXERIL ) 5 MG tablet [id number]  Take 1 tablet (5 mg total) by mouth 3 (three) times daily as needed.  Patient not taking: Reported on 05/03/2023   [name], [name], FNP  Active   levothyroxine  (SYNTHROID ) 150 MCG tablet [id number]  Take 1 tablet (150 mcg total) by mouth daily before breakfast. Almeda Jacobs, MD  Active   lidocaine -prilocaine  (EMLA ) cream [id number]  Apply to affected area once Gorsuch, Ni, MD  Active   metFORMIN  (GLUCOPHAGE ) 1000 MG tablet [id number] Yes Take 1 tablet (1,000 mg total) by mouth 2 (two) times daily with a meal. [name], [name], FNP Taking Active   methylPREDNISolone  (MEDROL  DOSEPAK) 4 MG TBPK tablet [id number]  Take as instructed on the packaging [name], [name], FNP  Active    ondansetron  (ZOFRAN ) 8 MG tablet [id number]  Take 1 tablet (8 mg total) by mouth every 8 (eight) hours as needed for nausea or vomiting. Start on the third day after chemotherapy.  Patient not taking: Reported on 01/04/2023   Almeda Jacobs, MD  Active   pantoprazole  (PROTONIX ) 40 MG tablet [id number]  Take 1 tablet (40 mg total) by mouth daily. [name], [name], FNP  Active   prochlorperazine  (COMPAZINE ) 10 MG tablet [id number]  Take 1 tablet (10 mg total) by mouth every 6 (six) hours as needed for nausea or vomiting.  Patient not taking: Reported on 05/03/2023   Almeda Jacobs, MD  Active   triamcinolone  cream (KENALOG ) 0.1 % [id number]  Apply 1 Application topically 2 (two) times daily. [name], [name], FNP  Active   valsartan  (DIOVAN ) 80 MG tablet [id number] Yes Take 1 tablet (80 mg total) by mouth daily. [name], [name], FNP Taking Active  Assessment/Plan:   Diabetes: - Currently controlled with last A1C 6.5% (down from 7.8%) on 05/03/23. Patient reported BG readings are at goal. She has been tolerating Trulicity  well and would like to increase the dose. Patient is a good candidate for continued optimization of Trulicity  given desire for weight loss with BMI of 48 and recent weight gain.  - Reviewed long term cardiovascular and renal outcomes of uncontrolled blood sugar - Reviewed goal A1c, goal fasting, and goal 2 hour post prandial glucose - Reviewed dietary modifications including limiting portion size of carb heavy foods - Reviewed lifestyle modifications including: increasing physical activity as able  - Recommend to increase Trulicity  to 4.5 mg weekly - Recommend to continue metformin  1000 mg BID  - Patient denies personal or family history of multiple endocrine neoplasia type 2, medullary thyroid  cancer; personal history of pancreatitis or gallbladder disease. - Recommended to consider obtaining a scale to monitor weight at home. - UACR due at next PCP  visit    Hypertension: - Currently controlled with last office BP 128/82 on 05/03/23. Patient reported home BP readings are more elevated, but she has not been resting prior to checking. Reviewed proper BP check technique today and encouraged her to continue monitoring at home. - Reviewed long term cardiovascular and renal outcomes of uncontrolled blood pressure - Reviewed appropriate blood pressure monitoring technique and reviewed goal blood pressure. Recommended to check home blood pressure and heart rate. - Recommend to continue valsartan  80 mg daily. Repeat BMET on 04/27/23 was stable.    Hyperlipidemia/ASCVD Risk Reduction: - Currently uncontrolled with last LDL 131 mg/dL (up from 96 mg/dL), above goal <16 mg/dL given DM. TG also elevated at 258 mg/dL (up from 109 mg/dL). Patient thinks she was fasting at time of last lipid panel, but unsure given the results. Atorvastatin  was increased to 80 mg and patient reports adherence to this dose. Counseled on importance of heart healthy diet with focus on avoiding processed and sugary foods and incorporating whole foods - fresh fruits and vegetables, whole grains, lean protein. - Reviewed long term complications of uncontrolled cholesterol - Recommend to continue atorvastatin  80 mg daily. Repeat lipid panel due at next PCP visit.   Follow Up Plan: PharmD on 06/15/23, PCP on 06/22/23  Georga Killings, PharmD PGY-1 Pharmacy Resident

## 2023-05-12 ENCOUNTER — Encounter: Payer: Self-pay | Admitting: Hematology and Oncology

## 2023-05-12 ENCOUNTER — Encounter (INDEPENDENT_AMBULATORY_CARE_PROVIDER_SITE_OTHER): Payer: Self-pay | Admitting: Otolaryngology

## 2023-05-12 ENCOUNTER — Other Ambulatory Visit: Payer: Self-pay

## 2023-05-14 ENCOUNTER — Other Ambulatory Visit: Payer: Self-pay

## 2023-05-20 ENCOUNTER — Other Ambulatory Visit: Payer: Self-pay

## 2023-05-22 ENCOUNTER — Other Ambulatory Visit: Payer: Self-pay

## 2023-06-07 ENCOUNTER — Other Ambulatory Visit: Payer: Self-pay

## 2023-06-07 ENCOUNTER — Encounter: Payer: Self-pay | Admitting: Hematology and Oncology

## 2023-06-08 ENCOUNTER — Ambulatory Visit: Payer: Self-pay | Admitting: Hematology and Oncology

## 2023-06-08 ENCOUNTER — Inpatient Hospital Stay (HOSPITAL_BASED_OUTPATIENT_CLINIC_OR_DEPARTMENT_OTHER): Admitting: Hematology and Oncology

## 2023-06-08 ENCOUNTER — Inpatient Hospital Stay

## 2023-06-08 ENCOUNTER — Encounter: Payer: Self-pay | Admitting: Hematology and Oncology

## 2023-06-08 ENCOUNTER — Inpatient Hospital Stay: Attending: Psychiatry

## 2023-06-08 ENCOUNTER — Other Ambulatory Visit: Payer: Self-pay

## 2023-06-08 VITALS — BP 125/79 | HR 73 | Temp 97.6°F | Resp 20 | Ht 60.0 in | Wt 243.5 lb

## 2023-06-08 DIAGNOSIS — Z5112 Encounter for antineoplastic immunotherapy: Secondary | ICD-10-CM | POA: Insufficient documentation

## 2023-06-08 DIAGNOSIS — C541 Malignant neoplasm of endometrium: Secondary | ICD-10-CM

## 2023-06-08 DIAGNOSIS — E039 Hypothyroidism, unspecified: Secondary | ICD-10-CM | POA: Insufficient documentation

## 2023-06-08 LAB — CMP (CANCER CENTER ONLY)
ALT: 25 U/L (ref 0–44)
AST: 21 U/L (ref 15–41)
Albumin: 3.9 g/dL (ref 3.5–5.0)
Alkaline Phosphatase: 57 U/L (ref 38–126)
Anion gap: 6 (ref 5–15)
BUN: 9 mg/dL (ref 6–20)
CO2: 29 mmol/L (ref 22–32)
Calcium: 8.9 mg/dL (ref 8.9–10.3)
Chloride: 104 mmol/L (ref 98–111)
Creatinine: 0.56 mg/dL (ref 0.44–1.00)
GFR, Estimated: >60 mL/min (ref >60)
Glucose, Bld: 114 mg/dL — ABNORMAL HIGH (ref 70–99)
Potassium: 3.9 mmol/L (ref 3.5–5.1)
Sodium: 139 mmol/L (ref 135–145)
Total Bilirubin: 0.3 mg/dL (ref 0.0–1.2)
Total Protein: 7.4 g/dL (ref 6.5–8.1)

## 2023-06-08 LAB — CBC WITH DIFFERENTIAL (CANCER CENTER ONLY)
Abs Immature Granulocytes: 0.02 10*3/uL (ref 0.00–0.07)
Basophils Absolute: 0.1 10*3/uL (ref 0.0–0.1)
Basophils Relative: 1 %
Eosinophils Absolute: 0.1 10*3/uL (ref 0.0–0.5)
Eosinophils Relative: 2 %
HCT: 36.5 % (ref 36.0–46.0)
Hemoglobin: 11.9 g/dL — ABNORMAL LOW (ref 12.0–15.0)
Immature Granulocytes: 0 %
Lymphocytes Relative: 36 %
Lymphs Abs: 2.8 10*3/uL (ref 0.7–4.0)
MCH: 30.1 pg (ref 26.0–34.0)
MCHC: 32.6 g/dL (ref 30.0–36.0)
MCV: 92.2 fL (ref 80.0–100.0)
Monocytes Absolute: 0.6 10*3/uL (ref 0.1–1.0)
Monocytes Relative: 8 %
Neutro Abs: 4.1 10*3/uL (ref 1.7–7.7)
Neutrophils Relative %: 53 %
Platelet Count: 262 10*3/uL (ref 150–400)
RBC: 3.96 MIL/uL (ref 3.87–5.11)
RDW: 15.7 % — ABNORMAL HIGH (ref 11.5–15.5)
WBC Count: 7.7 10*3/uL (ref 4.0–10.5)
nRBC: 0 % (ref 0.0–0.2)

## 2023-06-08 LAB — TSH: TSH: 48.9 u[IU]/mL — ABNORMAL HIGH (ref 0.350–4.500)

## 2023-06-08 MED ORDER — LEVOTHYROXINE SODIUM 175 MCG PO TABS
175.0000 ug | ORAL_TABLET | Freq: Every day | ORAL | 0 refills | Status: DC
  Filled 2023-06-08: qty 60, 60d supply, fill #0
  Filled 2023-06-23: qty 30, 30d supply, fill #0

## 2023-06-08 MED ORDER — SODIUM CHLORIDE 0.9 % IV SOLN
1000.0000 mg | Freq: Once | INTRAVENOUS | Status: AC
  Administered 2023-06-08: 1000 mg via INTRAVENOUS
  Filled 2023-06-08: qty 20

## 2023-06-08 MED ORDER — SODIUM CHLORIDE 0.9 % IV SOLN: Freq: Once | INTRAVENOUS | Status: AC

## 2023-06-08 MED ORDER — SODIUM CHLORIDE 0.9% FLUSH
10.0000 mL | INTRAVENOUS | Status: DC | PRN
  Administered 2023-06-08: 10 mL

## 2023-06-08 MED ORDER — HEPARIN SOD (PORK) LOCK FLUSH 100 UNIT/ML IV SOLN
500.0000 [IU] | Freq: Once | INTRAVENOUS | Status: AC | PRN
  Administered 2023-06-08: 500 [IU]

## 2023-06-08 MED ORDER — SODIUM CHLORIDE 0.9% FLUSH
10.0000 mL | Freq: Once | INTRAVENOUS | Status: AC
  Administered 2023-06-08: 10 mL

## 2023-06-08 NOTE — Patient Instructions (Addendum)
 [facility] - A DEPT OF [facility]  Discharge Instructions: Thank you for choosing [facility] to provide your oncology and hematology care.   If you have a lab appointment with the Cancer Center, please go directly to the Cancer Center and check in at the registration area.   Wear comfortable clothing and clothing appropriate for easy access to any Portacath or PICC line.   We strive to give you quality time with your provider. You may need to reschedule your appointment if you arrive late (15 or more minutes).  Arriving late affects you and other patients whose appointments are after yours.  Also, if you miss three or more appointments without notifying the office, you may be dismissed from the clinic at the provider's discretion.      For prescription refill requests, have your pharmacy contact our office and allow 72 hours for refills to be completed.    Today you received the following chemotherapy and/or immunotherapy agent: Dostarlamib (Jempreli)   To help prevent nausea and vomiting after your treatment, we encourage you to take your nausea medication as directed.  BELOW ARE SYMPTOMS THAT SHOULD BE REPORTED IMMEDIATELY: *FEVER GREATER THAN 100.4 F (38 C) OR HIGHER *CHILLS OR SWEATING *NAUSEA AND VOMITING THAT IS NOT CONTROLLED WITH YOUR NAUSEA MEDICATION *UNUSUAL SHORTNESS OF BREATH *UNUSUAL BRUISING OR BLEEDING *URINARY PROBLEMS (pain or burning when urinating, or frequent urination) *BOWEL PROBLEMS (unusual diarrhea, constipation, pain near the anus) TENDERNESS IN MOUTH AND THROAT WITH OR WITHOUT PRESENCE OF ULCERS (sore throat, sores in mouth, or a toothache) UNUSUAL RASH, SWELLING OR PAIN  UNUSUAL VAGINAL DISCHARGE OR ITCHING   Items with * indicate a potential emergency and should be followed up as soon as possible or go to the Emergency Department if any problems should occur.  Please show the CHEMOTHERAPY ALERT CARD or  IMMUNOTHERAPY ALERT CARD at check-in to the Emergency Department and triage nurse.  Should you have questions after your visit or need to cancel or reschedule your appointment, please contact [facility] - A DEPT OF [facility]  Dept: [phone]  and follow the prompts.  Office hours are 8:00 a.m. to 4:30 p.m. Monday - Friday. Please note that voicemails left after 4:00 p.m. may not be returned until the following business day.  We are closed weekends and major holidays. You have access to a nurse at all times for urgent questions. Please call the main number to the clinic Dept: [phone] and follow the prompts.   For any non-urgent questions, you may also contact your provider using MyChart. We now offer e-Visits for anyone 43 and older to request care online for non-urgent symptoms. For details visit [email or web address].   Also download the MyChart app! Go to the app store, search "MyChart", open the app, select [facility], and log in with your MyChart username and password.

## 2023-06-08 NOTE — Assessment & Plan Note (Addendum)
 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  Due to allergic reaction, paclitaxel  will discontinue and substituted with docetaxel .  Adjuvant treatment was completed by June 2024 and CT imaging done subsequently showed complete response.  She remained on maintenance immunotherapy without complications  Imaging study from March 09, 2023 showed no evidence of recurrence Treatment course is complicated by excessive weight gain, multifactorial with component contributed by severe hypothyroidism The plan will be to continue maintenance immunotherapy I plan to repeat imaging study again in 6 months, due in September 2025

## 2023-06-08 NOTE — Telephone Encounter (Signed)
-----   Message from Almeda Jacobs sent at 06/08/2023  1:34 PM EDT ----- Her TSH is better but still quite high, increase synthroid  to 175 mcg, call in 60 tabs to her pharmacy with no refills

## 2023-06-08 NOTE — Telephone Encounter (Signed)
 Called and given below message. She verbalized understanding. Rx sent to her preferred pharmacy.

## 2023-06-08 NOTE — Progress Notes (Signed)
 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  Due to allergic reaction, paclitaxel  will discontinue and substituted with docetaxel .  Adjuvant treatment was completed by June 2024 and CT imaging done subsequently showed complete response.  She remained on maintenance immunotherapy without complications  Imaging study from March 09, 2023 showed no evidence of recurrence Treatment course is complicated by excessive weight gain, multifactorial with component contributed by severe hypothyroidism The plan will be to continue maintenance immunotherapy I plan to repeat imaging study again in 6 months, due in September 2025 Acquired hypothyroidism we will continue close monitoring and adjust the dose of Synthroid  as needed  No orders of the defined types were placed in this encounter.    Almeda Jacobs, MD  INTERVAL HISTORY: she returns for treatment follow-up Complications related to previous cycle of chemotherapy included abnormal thyroid  functionshe was seen by primary care doctor recently due to knee pain but that has resolved  PHYSICAL EXAMINATION: ECOG PERFORMANCE STATUS: 0 - Asymptomatic  No results found for: "CAN125"    Latest Ref Rng & Units 06/08/2023    8:01 AM 04/27/2023    8:10 AM 03/16/2023    8:47 AM  CBC  WBC 4.0 - 10.5 K/uL 7.7  9.0  8.7   Hemoglobin 12.0 - 15.0 g/dL 16.1  09.6  04.5   Hematocrit 36.0 - 46.0 % 36.5  36.8  39.5   Platelets 150 - 400 K/uL 262  232  243       Chemistry      Component Value Date/Time   NA 139  06/08/2023 0801   NA 139 02/08/2023 1311   K 3.9 06/08/2023 0801   CL 104 06/08/2023 0801   CO2 29 06/08/2023 0801   BUN 9 06/08/2023 0801   BUN 11 02/08/2023 1311   CREATININE 0.56 06/08/2023 0801      Component Value Date/Time   CALCIUM  8.9 06/08/2023 0801   ALKPHOS 57 06/08/2023 0801   AST 21 06/08/2023 0801   ALT 25 06/08/2023 0801   BILITOT 0.3 06/08/2023 0801       There were no vitals filed for this visit. There were no vitals filed for this visit. Other relevant data reviewed during this visit included CBC, CMP and TSH

## 2023-06-08 NOTE — Assessment & Plan Note (Addendum)
 we will continue close monitoring and adjust the dose of Synthroid  as needed

## 2023-06-09 ENCOUNTER — Other Ambulatory Visit: Payer: Self-pay

## 2023-06-09 LAB — T4: T4, Total: 6.4 ug/dL (ref 4.5–12.0)

## 2023-06-15 ENCOUNTER — Other Ambulatory Visit: Payer: Self-pay

## 2023-06-15 NOTE — Progress Notes (Signed)
 06/15/2023 Name: Maureen Campos MRN: [id number] DOB: 10-13-1970  Chief Complaint  Patient presents with   Diabetes   Hypertension   Hyperlipidemia    Maureen Campos is a 53 y.o. year old female who presented for a telephone visit.   They were referred to the pharmacist by their PCP for assistance in managing diabetes.   Care Team: Primary Care Provider: [name], [name], FNP ; Next Scheduled Visit: 06/28/23 Oncologist: Dr. Marton Sleeper; Next Scheduled Visit: 07/20/23  Medication Access/Adherence  Current Pharmacy:  [facility] [address], [address] [place] [address] Phone: [phone] Fax: [phone]   Patient reports affordability concerns with their medications: Yes - getting Trulicity  through [facility] supply Patient reports access/transportation concerns to their pharmacy: No  Patient reports adherence concerns with their medications:  No     Diabetes:  Current medications: Trulicity   4.5 mg weekly, metformin  1000 mg BID  Reports she is tolerating increased Trulicity  dose well. Denies n/v, constipation, diarrhea. Has reduced the frequency of her BG monitoring as she says her BG is always in range. Reports fasting BG readings 115-120, but has not checked in the past 4 days.  Patient denies hypoglycemic s/sx including dizziness, shakiness, sweating. Patient denies hyperglycemic symptoms including polyuria, polydipsia, polyphagia, nocturia, neuropathy, blurred vision.  Current meal patterns:  Eats 2 meals/day, but does snack during the day - Breakfast: boiled egg - Lunch: eggs, beans, cheese, guacamole, small tortillas - Supper: beans, meat, onion  - Snacks: vegetables, fruits - Drinks: drinking more water    Current physical activity: limited by  back pain  Current medication access support: Trulicity  via [facility] supply  Hypertension:  Current medications: valsartan  80 mg  daily  Patient has a validated, automated, upper arm home BP cuff Current blood pressure readings readings: SBP 132 on Sunday  Hyperlipidemia/ASCVD Risk Reduction  Current lipid lowering medications: atorvastatin  80 mg daily  Patient reports she has been taking atorvastatin  every couple of days because she worries about taking so many pills.  Family History: no known cardiac hx Risk Factors: DM HTN  The 10-year ASCVD risk score (Arnett DK, et al., 2019) is: 5%   Values used to calculate the score:     Age: 82 years     Sex: Female     Is Non-Hispanic African American: No     Diabetic: Yes     Tobacco smoker: No     Systolic Blood Pressure: 125 mmHg     Is BP treated: Yes     HDL Cholesterol: 43 mg/dL     Total Cholesterol: 220 mg/dL  Objective:  Lab Results  Component Value Date   HGBA1C 6.5 (A) 05/03/2023    Lab Results  Component Value Date   CREATININE 0.56 06/08/2023   BUN 9 06/08/2023   NA 139 06/08/2023   K 3.9 06/08/2023   CL 104 06/08/2023   CO2 29 06/08/2023    Lab Results  Component Value Date   CHOL 220 (H) 05/04/2023   HDL 43 05/04/2023   LDLCALC 131 (H) 05/04/2023   LDLDIRECT 75 01/05/2023   TRIG 258 (H) 05/04/2023   CHOLHDL 5.1 (H) 05/04/2023    Medications Reviewed Today     Reviewed by Philmore Bream, RPH (Pharmacist) on 06/15/23 at 1947  Med List Status: <None>   Medication Order Taking? Sig Documenting Provider Last Dose Status Informant  apixaban  (ELIQUIS ) 5 MG TABS tablet [id number]  Take 1  tablet (5 mg total) by mouth 2 (two) times daily. Almeda Jacobs, MD  Expired 06/04/23 2359   atorvastatin  (LIPITOR) 80 MG tablet [id number] Yes Take 1 tablet (80 mg total) by mouth daily. [name], [name], FNP Taking Active   Blood Glucose Monitoring Suppl DEVI [id number]  1 each by Does not apply route in the morning, at noon, and at bedtime. May substitute to any manufacturer covered by patient's insurance. [name], [name], FNP  Active    Dulaglutide  (TRULICITY ) 4.5 MG/0.5ML [place] [id number] Yes Inject 4.5 mg as directed once a week. [name], [name], FNP Taking Active   levothyroxine  (SYNTHROID ) 175 MCG tablet [id number]  Take 1 tablet (175 mcg total) by mouth daily before breakfast. Almeda Jacobs, MD  Active   lidocaine -prilocaine  (EMLA ) cream [id number]  Apply to affected area once Gorsuch, Ni, MD  Active   metFORMIN  (GLUCOPHAGE ) 1000 MG tablet [id number] Yes Take 1 tablet (1,000 mg total) by mouth 2 (two) times daily with a meal. [name], [name], FNP Taking Active   ondansetron  (ZOFRAN ) 8 MG tablet [id number]  Take 1 tablet (8 mg total) by mouth every 8 (eight) hours as needed for nausea or vomiting. Start on the third day after chemotherapy.  Patient not taking: Reported on 01/04/2023   Almeda Jacobs, MD  Active   pantoprazole  (PROTONIX ) 40 MG tablet [id number]  Take 1 tablet (40 mg total) by mouth daily. [name], [name], FNP  Active   prochlorperazine  (COMPAZINE ) 10 MG tablet [id number]  Take 1 tablet (10 mg total) by mouth every 6 (six) hours as needed for nausea or vomiting.  Patient not taking: Reported on 05/03/2023   Almeda Jacobs, MD  Active   triamcinolone  cream (KENALOG ) 0.1 % [id number]  Apply 1 Application topically 2 (two) times daily. [name], [name], FNP  Active   valsartan  (DIOVAN ) 80 MG tablet [id number] Yes Take 1 tablet (80 mg total) by mouth daily. [name], [name], FNP Taking Active               Assessment/Plan:   Diabetes: - Currently controlled with last A1C 6.5% (down from 7.8%) on 05/03/23. Patient reported BG readings are at goal and she is tolerating increased Trulicity  dose well. Will continue current regimen. - Reviewed long term cardiovascular and renal outcomes of uncontrolled blood sugar - Reviewed goal A1c, goal fasting, and goal 2 hour post prandial glucose - Recommend to continue Trulicity  4.5 mg weekly - Recommend to continue metformin  1000 mg BID  - Patient denies personal or  family history of multiple endocrine neoplasia type 2, medullary thyroid  cancer; personal history of pancreatitis or gallbladder disease. - Recommend to check fasting blood glucose a few times a week and as needed - UACR due at upcoming PCP visit    Hypertension: - Currently controlled with last ambulatory BP 125/79, below goal <130/80 - Reviewed long term cardiovascular and renal outcomes of uncontrolled blood pressure - Reviewed appropriate blood pressure monitoring technique and reviewed goal blood pressure. Recommended to check home blood pressure and heart rate periodically. - Recommend to continue valsartan  80 mg daily     Hyperlipidemia/ASCVD Risk Reduction: - Currently uncontrolled with last LDL 131 mg/dL (up from 96 mg/dL), above goal <23 mg/dL given DM. TG also elevated at 258 mg/dL (up from 557 mg/dL). Atorvastatin  has since been increased to 80 mg daily, although she says she has not been taking this every day. Emphasized the importance of statin therapy to lower cardiovascular risk and encouraged her to take  every day as prescribed.  - Reviewed long term complications of uncontrolled cholesterol - Recommend to continue atorvastatin  80 mg daily. Repeat lipid panel due in 1-2 months.   Follow Up Plan: PCP on 06/28/23  Georga Killings, PharmD PGY-1 Pharmacy Resident

## 2023-06-18 ENCOUNTER — Other Ambulatory Visit: Payer: Self-pay

## 2023-06-22 ENCOUNTER — Other Ambulatory Visit: Payer: Self-pay

## 2023-06-22 ENCOUNTER — Ambulatory Visit: Payer: Self-pay | Admitting: Nurse Practitioner

## 2023-06-23 ENCOUNTER — Encounter: Payer: Self-pay | Admitting: Hematology and Oncology

## 2023-06-23 ENCOUNTER — Other Ambulatory Visit: Payer: Self-pay

## 2023-06-28 ENCOUNTER — Encounter: Payer: Self-pay | Admitting: Hematology and Oncology

## 2023-06-28 ENCOUNTER — Ambulatory Visit (INDEPENDENT_AMBULATORY_CARE_PROVIDER_SITE_OTHER): Payer: Self-pay | Admitting: Nurse Practitioner

## 2023-06-28 ENCOUNTER — Other Ambulatory Visit: Payer: Self-pay

## 2023-06-28 ENCOUNTER — Encounter: Payer: Self-pay | Admitting: Nurse Practitioner

## 2023-06-28 ENCOUNTER — Ambulatory Visit: Payer: Self-pay | Admitting: Nurse Practitioner

## 2023-06-28 VITALS — BP 149/85 | HR 82 | Temp 97.0°F | Wt 242.0 lb

## 2023-06-28 DIAGNOSIS — I10 Essential (primary) hypertension: Secondary | ICD-10-CM

## 2023-06-28 DIAGNOSIS — E039 Hypothyroidism, unspecified: Secondary | ICD-10-CM

## 2023-06-28 DIAGNOSIS — I82C11 Acute embolism and thrombosis of right internal jugular vein: Secondary | ICD-10-CM

## 2023-06-28 DIAGNOSIS — E66813 Obesity, class 3: Secondary | ICD-10-CM

## 2023-06-28 DIAGNOSIS — C541 Malignant neoplasm of endometrium: Secondary | ICD-10-CM

## 2023-06-28 DIAGNOSIS — E785 Hyperlipidemia, unspecified: Secondary | ICD-10-CM

## 2023-06-28 DIAGNOSIS — E119 Type 2 diabetes mellitus without complications: Secondary | ICD-10-CM

## 2023-06-28 MED ORDER — VALSARTAN 80 MG PO TABS
80.0000 mg | ORAL_TABLET | Freq: Every day | ORAL | 1 refills | Status: DC
Start: 2023-06-28 — End: 2023-11-30
  Filled 2023-06-28: qty 90, 90d supply, fill #0

## 2023-06-28 MED ORDER — METFORMIN HCL 1000 MG PO TABS
1000.0000 mg | ORAL_TABLET | Freq: Two times a day (BID) | ORAL | 3 refills | Status: DC
Start: 2023-06-28 — End: 2023-11-30
  Filled 2023-06-28: qty 180, 90d supply, fill #0

## 2023-06-28 NOTE — Assessment & Plan Note (Signed)
 Controlled on metformin  1000 mg twice daily, Trulicity  4.5 mg once weekly Continue current medication Due for diabetic eye exam she has not been able to complete the exam due to lack of insurance Continue current medications Patient counseled on low-carb diet Appreciate collaboration with the clinical pharmacist

## 2023-06-28 NOTE — Progress Notes (Addendum)
 Established Patient Office Visit  Subjective:  Patient ID: Maureen Campos, female    DOB: 02/22/1970  Age: 53 y.o. MRN: [id number]  CC:  Chief Complaint  Patient presents with   Hyperlipidemia    HPI Maureen Campos is a 53 y.o. 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)    Diabetes mellitus without complication (HCC)    Dyspnea    with activity   Endometrial cancer (HCC)    Family history of colon cancer 03/04/2022   Family history of uterine cancer    Fast heart beat    with activity   Fatigue    Hearing loss    Left ear   History of kidney stones    12 years ago   Lazy eye, left    Left leg numbness    occ   Migraine    occ    Past Surgical History:  Procedure Laterality Date   CHOLECYSTECTOMY  2011   IR CHEST FLUORO  04/07/2022   IR CV LINE INJECTION  04/09/2022   IR IMAGING GUIDED PORT INSERTION  03/05/2022   IR PORT REPAIR CENTRAL VENOUS ACCESS DEVICE  04/09/2022   IR US  GUIDE VASC ACCESS RIGHT  04/09/2022   ROBOTIC ASSISTED TOTAL HYSTERECTOMY WITH BILATERAL SALPINGO OOPHERECTOMY N/A 02/10/2022   Procedure: XI ROBOTIC ASSISTED TOTAL HYSTERECTOMY WITH BILATERAL SALPINGO OOPHORECTOMY;  Surgeon: Eldonna Mays, MD;  Location: [facility];  Service: Gynecology;  Laterality: N/A;   ROBOTIC PELVIC AND PARA-AORTIC LYMPH NODE DISSECTION N/A 02/10/2022   Procedure: XI ROBOTIC  PELVIC AND PARA-AORTIC SENTINEL LYMPH NODE DISSECTION;  Surgeon: Eldonna Mays, MD;  Location: [facility];  Service: Gynecology;  Laterality: N/A;    Family History  Problem Relation Age of Onset   Colon cancer Father 24   Uterine cancer Sister 71   Melanoma Sister 24   Cancer Paternal Aunt        possible brain cancer, dx > 50   Breast cancer Neg Hx    Ovarian cancer Neg Hx    Endometrial cancer Neg Hx    Pancreatic cancer Neg Hx    Prostate cancer Neg Hx     Social History   Socioeconomic History   Marital status: Significant Other    Spouse name: Not on file   Number of children: 4   Years of education: Not on file   Highest education level: Not on file  Occupational History   Not on file  Tobacco Use   Smoking status: Never   Smokeless tobacco: Never  Vaping Use   Vaping status: Never Used  Substance and Sexual Activity   Alcohol use: Not Currently    Comment: every weekend   Drug use: No   Sexual activity: Yes    Birth control/protection: None  Other Topics Concern   Not on file  Social History Narrative   Lives with her daughter   Social Drivers of Health  Financial Resource Strain: Not on file  Food Insecurity: Food Insecurity Present (07/28/2022)   Hunger Vital Sign    Worried About Running Out of Food in the Last Year: Sometimes true    Ran Out of Food in the Last Year: Sometimes true  Transportation Needs: No Transportation Needs (07/29/2022)   PRAPARE - Administrator, Civil Service (Medical): No    Lack of Transportation (Non-Medical): No  Physical Activity: Not on file  Stress: Not on file  Social Connections: Not on file  Intimate Partner Violence: Not At Risk (07/29/2022)   Humiliation, Afraid, Rape, and Kick questionnaire    Fear of Current or Ex-Partner: No    Emotionally Abused: No    Physically Abused: No    Sexually Abused: No    Outpatient Medications Prior to Visit  Medication Sig Dispense Refill   apixaban  (ELIQUIS ) 5  MG TABS tablet Take 1 tablet (5 mg total) by mouth 2 (two) times daily. 180 tablet 3   atorvastatin  (LIPITOR) 80 MG tablet Take 1 tablet (80 mg total) by mouth daily. 90 tablet 3   Blood Glucose Monitoring Suppl DEVI 1 each by Does not apply route in the morning, at noon, and at bedtime. May substitute to any manufacturer covered by patient's insurance. 1 each 0   Dulaglutide  (TRULICITY ) 4.5 MG/0.5ML SOAJ Inject 4.5 mg as directed once a week. 2 mL 5   levothyroxine  (SYNTHROID ) 175 MCG tablet Take 1 tablet (175 mcg total) by mouth daily before breakfast. 60 tablet 0   lidocaine -prilocaine  (EMLA ) cream Apply to affected area once 30 g 3   pantoprazole  (PROTONIX ) 40 MG tablet Take 1 tablet (40 mg total) by mouth daily. 5 tablet 0   triamcinolone  cream (KENALOG ) 0.1 % Apply 1 Application topically 2 (two) times daily. 30 g 1   metFORMIN  (GLUCOPHAGE ) 1000 MG tablet Take 1 tablet (1,000 mg total) by mouth 2 (two) times daily with a meal. 180 tablet 3   valsartan  (DIOVAN ) 80 MG tablet Take 1 tablet (80 mg total) by mouth daily. 30 tablet 6   ondansetron  (ZOFRAN ) 8 MG tablet Take 1 tablet (8 mg total) by mouth every 8 (eight) hours as needed for nausea or vomiting. Start on the third day after chemotherapy. (Patient not taking: Reported on 06/28/2023) 30 tablet 1   prochlorperazine  (COMPAZINE ) 10 MG tablet Take 1 tablet (10 mg total) by mouth every 6 (six) hours as needed for nausea or vomiting. (Patient not taking: Reported on 06/28/2023) 30 tablet 1   No facility-administered medications prior to visit.    Allergies  Allergen Reactions   Paclitaxel  Shortness Of Breath and Other (See Comments)    See progress note from 04/10/22   Advil  [Ibuprofen ] Itching and Rash    ROS Review of Systems  Constitutional:  Negative for appetite change, chills, fatigue and fever.  HENT:  Negative for congestion, postnasal drip, rhinorrhea and sneezing.   Respiratory:  Negative for cough, shortness of breath and  wheezing.   Cardiovascular:  Negative for chest pain, palpitations and leg swelling.  Gastrointestinal:  Negative for abdominal pain, constipation, nausea and vomiting.  Genitourinary:  Negative for difficulty urinating, dysuria, flank pain and frequency.  Musculoskeletal:  Negative for gait problem, joint swelling and myalgias.  Skin:  Negative for color change, pallor, rash and wound.  Neurological:  Negative for dizziness, facial asymmetry, weakness, numbness and headaches.  Psychiatric/Behavioral:  Negative for behavioral problems, confusion, self-injury and suicidal ideas.  Objective:    Physical Exam Vitals and nursing note reviewed.  Constitutional:      General: She is not in acute distress.    Appearance: Normal appearance. She is obese. She is not ill-appearing, toxic-appearing or diaphoretic.   Eyes:     General: No scleral icterus.       Right eye: No discharge.        Left eye: No discharge.     Extraocular Movements: Extraocular movements intact.     Conjunctiva/sclera: Conjunctivae normal.    Cardiovascular:     Rate and Rhythm: Normal rate and regular rhythm.     Pulses: Normal pulses.     Heart sounds: Normal heart sounds. No murmur heard.    No friction rub. No gallop.  Pulmonary:     Effort: Pulmonary effort is normal. No respiratory distress.     Breath sounds: Normal breath sounds. No stridor. No wheezing, rhonchi or rales.  Chest:     Chest wall: No tenderness.  Abdominal:     General: There is no distension.     Palpations: Abdomen is soft.     Tenderness: There is no abdominal tenderness. There is no right CVA tenderness, left CVA tenderness or guarding.   Musculoskeletal:        General: No swelling or signs of injury.     Right lower leg: No edema.     Left lower leg: No edema.   Skin:    General: Skin is warm and dry.     Capillary Refill: Capillary refill takes less than 2 seconds.     Coloration: Skin is not jaundiced or pale.      Findings: No bruising, erythema or lesion.   Neurological:     Mental Status: She is alert and oriented to person, place, and time.     Motor: No weakness.     Gait: Gait normal.   Psychiatric:        Mood and Affect: Mood normal.        Behavior: Behavior normal.        Thought Content: Thought content normal.        Judgment: Judgment normal.     BP (!) 149/85   Pulse 82   Temp (!) 97 F (36.1 C)   Wt 242 lb (109.8 kg)   LMP 01/06/2022 Comment: Hysterectomy 02/10/22  SpO2 98%   BMI 47.26 kg/m  Wt Readings from Last 3 Encounters:  06/28/23 242 lb (109.8 kg)  06/08/23 243 lb 8 oz (110.5 kg)  05/03/23 248 lb (112.5 kg)    Lab Results  Component Value Date   TSH 48.900 (H) 06/08/2023   Lab Results  Component Value Date   WBC 7.7 06/08/2023   HGB 11.9 (L) 06/08/2023   HCT 36.5 06/08/2023   MCV 92.2 06/08/2023   PLT 262 06/08/2023   Lab Results  Component Value Date   NA 139 06/08/2023   K 3.9 06/08/2023   CO2 29 06/08/2023   GLUCOSE 114 (H) 06/08/2023   BUN 9 06/08/2023   CREATININE 0.56 06/08/2023   BILITOT 0.3 06/08/2023   ALKPHOS 57 06/08/2023   AST 21 06/08/2023   ALT 25 06/08/2023   PROT 7.4 06/08/2023   ALBUMIN 3.9 06/08/2023   CALCIUM  8.9 06/08/2023   ANIONGAP 6 06/08/2023   EGFR 112 02/08/2023   Lab Results  Component Value Date   CHOL 220 (H) 05/04/2023   Lab Results  Component Value Date   HDL  43 05/04/2023   Lab Results  Component Value Date   LDLCALC 131 (H) 05/04/2023   Lab Results  Component Value Date   TRIG 258 (H) 05/04/2023   Lab Results  Component Value Date   CHOLHDL 5.1 (H) 05/04/2023   Lab Results  Component Value Date   HGBA1C 6.5 (A) 05/03/2023      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 The importance of attaining a healthy weight is also discussed.     06/28/2023    2:43 PM 06/28/2023    2:32 PM 06/08/2023   10:08 AM 06/08/2023    8:30 AM 05/03/2023    3:54 PM 04/27/2023    8:38 AM 03/16/2023    9:11 AM  BP/Weight  Systolic BP 149 157 125 125 128 129 136  Diastolic BP 85 81 79 73 82 74 83  Wt. (Lbs)  242  243.5 248 246.9 240  BMI  47.26 kg/m2  47.56 kg/m2 48.43 kg/m2 48.22 kg/m2 46.87 kg/m2           Relevant Medications   valsartan  (DIOVAN ) 80 MG tablet   Other Relevant Orders   Recheck vitals     Endocrine   Diabetes mellitus type 2, uncomplicated (HCC)   Controlled on metformin  1000 mg twice daily, Trulicity  4.5 mg once weekly Continue current medication Due for diabetic eye exam she has not been able to complete the exam due to lack of insurance Continue current medications Patient counseled on low-carb diet Appreciate collaboration with the clinical pharmacist      Relevant Medications   valsartan  (DIOVAN ) 80 MG tablet   metFORMIN  (GLUCOPHAGE ) 1000 MG tablet   Other Relevant Orders   Microalbumin/Creatinine Ratio, Urine   Lipid panel   Acquired hypothyroidism   Currently on levothyroxine  175 mcg daily Oncology has been making adjustments to her medication and monitoring her thyroid  function labs        Genitourinary   Endometrial cancer (HCC)   Followed by oncology Continue maintenance immunotherapy        Other   Obesity, Class III, BMI 40-49.9 (morbid obesity)   Wt Readings from Last 3 Encounters:  06/28/23 242 lb  (109.8 kg)  06/08/23 243 lb 8 oz (110.5 kg)  05/03/23 248 lb (112.5 kg)   Body mass index is 47.26 kg/m.  She has lost 6 pounds since her last visit Continue Trulicity  4.5 mg weekly for diabetes Patient counseled on low-carb diet Advised to engage in regular moderate exercises at least 150 minutes weekly as tolerated      Relevant Medications   metFORMIN  (GLUCOPHAGE ) 1000 MG tablet   Dyslipidemia, goal LDL below 70   Currently on atorvastatin  80 mg daily, she was not taking medication daily but since her last visit with the clinical pharmacist she has  been taking the medication daily We will check lipid panel today LDL goal is less than 70 Lab Results  Component Value Date   CHOL 220 (H) 05/04/2023   HDL 43 05/04/2023   LDLCALC 131 (H) 05/04/2023   LDLDIRECT 75 01/05/2023   TRIG 258 (H) 05/04/2023   CHOLHDL 5.1 (H) 05/04/2023         Relevant Medications   valsartan  (DIOVAN ) 80 MG tablet   Other Relevant Orders   Lipid panel    Meds ordered this encounter  Medications   valsartan  (DIOVAN ) 80 MG tablet    Sig: Take 1 tablet (80 mg total) by mouth daily.    Dispense:  90 tablet    Refill:  1    DOH supply   metFORMIN  (GLUCOPHAGE ) 1000 MG tablet    Sig: Take 1 tablet (1,000 mg total) by mouth 2 (two) times daily with a meal.    Dispense:  180 tablet    Refill:  3    Follow-up: Return in about 3 months (around 09/28/2023).    Schylar Wuebker R Tison Leibold, FNP

## 2023-06-28 NOTE — Patient Instructions (Addendum)
Around 3 times per week, check your blood pressure 2 times per day. once in the morning and once in the evening. The readings should be at least one minute apart. Write down these values and bring them to your next nurse visit/appointment.  When you check your BP, make sure you have been doing something calm/relaxing 5 minutes prior to checking. Both feet should be flat on the floor and you should be sitting. Use your left arm and make sure it is in a relaxed position (on a table), and that the cuff is at the approximate level/height of your heart.   Blood pressure goal is less than 130/80.    It is important that you exercise regularly at least 30 minutes 5 times a week as tolerated  Think about what you will eat, plan ahead. Choose " clean, green, fresh or frozen" over canned, processed or packaged foods which are more sugary, salty and fatty. 70 to 75% of food eaten should be vegetables and fruit. Three meals at set times with snacks allowed between meals, but they must be fruit or vegetables. Aim to eat over a 12 hour period , example 7 am to 7 pm, and STOP after  your last meal of the day. Drink water,generally about 64 ounces per day, no other drink is as healthy. Fruit juice is best enjoyed in a healthy way, by EATING the fruit.  Thanks for choosing [facility] we consider it a privelige to serve you.

## 2023-06-28 NOTE — Assessment & Plan Note (Signed)
 Currently on valsartan  80 mg daily, she reports taking the medication daily but I doubt her compliance, need to take medication daily as ordered to get blood pressure under control discussed Medication refilled Nurse visit in 2 weeks to recheck blood pressure DASH diet and commitment to daily physical activity for a minimum of 30 minutes discussed and encouraged, as a part of hypertension management. The importance of attaining a healthy weight is also discussed.     06/28/2023    2:43 PM 06/28/2023    2:32 PM 06/08/2023   10:08 AM 06/08/2023    8:30 AM 05/03/2023    3:54 PM 04/27/2023    8:38 AM 03/16/2023    9:11 AM  BP/Weight  Systolic BP 149 157 125 125 128 129 136  Diastolic BP 85 81 79 73 82 74 83  Wt. (Lbs)  242  243.5 248 246.9 240  BMI  47.26 kg/m2  47.56 kg/m2 48.43 kg/m2 48.22 kg/m2 46.87 kg/m2

## 2023-06-28 NOTE — Assessment & Plan Note (Signed)
 Continue Eliquis 5 mg twice daily

## 2023-06-28 NOTE — Assessment & Plan Note (Signed)
 Followed by oncology Continue maintenance immunotherapy

## 2023-06-28 NOTE — Assessment & Plan Note (Signed)
 Currently on atorvastatin  80 mg daily, she was not taking medication daily but since her last visit with the clinical pharmacist she has been taking the medication daily We will check lipid panel today LDL goal is less than 70 Lab Results  Component Value Date   CHOL 220 (H) 05/04/2023   HDL 43 05/04/2023   LDLCALC 131 (H) 05/04/2023   LDLDIRECT 75 01/05/2023   TRIG 258 (H) 05/04/2023   CHOLHDL 5.1 (H) 05/04/2023

## 2023-06-28 NOTE — Assessment & Plan Note (Signed)
 Currently on levothyroxine  175 mcg daily Oncology has been making adjustments to her medication and monitoring her thyroid  function labs

## 2023-06-28 NOTE — Assessment & Plan Note (Signed)
 Wt Readings from Last 3 Encounters:  06/28/23 242 lb (109.8 kg)  06/08/23 243 lb 8 oz (110.5 kg)  05/03/23 248 lb (112.5 kg)   Body mass index is 47.26 kg/m.  She has lost 6 pounds since her last visit Continue Trulicity  4.5 mg weekly for diabetes Patient counseled on low-carb diet Advised to engage in regular moderate exercises at least 150 minutes weekly as tolerated

## 2023-06-29 ENCOUNTER — Other Ambulatory Visit: Payer: Self-pay

## 2023-06-29 ENCOUNTER — Ambulatory Visit: Payer: Self-pay | Admitting: Nurse Practitioner

## 2023-06-29 LAB — MICROALBUMIN / CREATININE URINE RATIO
Creatinine, Urine: 113.4 mg/dL
Microalb/Creat Ratio: 22 mg/g{creat} (ref 0–29)
Microalbumin, Urine: 24.9 ug/mL

## 2023-06-29 LAB — LIPID PANEL
Chol/HDL Ratio: 3.8 ratio (ref 0.0–4.4)
Cholesterol, Total: 149 mg/dL (ref 100–199)
HDL: 39 mg/dL — ABNORMAL LOW (ref >39)
LDL Chol Calc (NIH): 77 mg/dL (ref 0–99)
Triglycerides: 195 mg/dL — ABNORMAL HIGH (ref 0–149)
VLDL Cholesterol Cal: 33 mg/dL (ref 5–40)

## 2023-07-05 ENCOUNTER — Encounter: Payer: Self-pay | Admitting: Hematology and Oncology

## 2023-07-05 ENCOUNTER — Other Ambulatory Visit: Payer: Self-pay

## 2023-07-07 ENCOUNTER — Other Ambulatory Visit: Payer: Self-pay

## 2023-07-07 ENCOUNTER — Other Ambulatory Visit (HOSPITAL_COMMUNITY): Payer: Self-pay

## 2023-07-12 ENCOUNTER — Ambulatory Visit: Payer: Self-pay

## 2023-07-16 ENCOUNTER — Other Ambulatory Visit (HOSPITAL_COMMUNITY): Payer: Self-pay

## 2023-07-20 ENCOUNTER — Inpatient Hospital Stay

## 2023-07-20 ENCOUNTER — Inpatient Hospital Stay: Attending: Psychiatry | Admitting: Hematology and Oncology

## 2023-07-20 ENCOUNTER — Encounter: Payer: Self-pay | Admitting: Hematology and Oncology

## 2023-07-20 DIAGNOSIS — Z5112 Encounter for antineoplastic immunotherapy: Secondary | ICD-10-CM | POA: Insufficient documentation

## 2023-07-20 DIAGNOSIS — C541 Malignant neoplasm of endometrium: Secondary | ICD-10-CM | POA: Insufficient documentation

## 2023-07-26 ENCOUNTER — Inpatient Hospital Stay

## 2023-07-26 ENCOUNTER — Other Ambulatory Visit: Payer: Self-pay | Admitting: Hematology and Oncology

## 2023-07-26 DIAGNOSIS — C541 Malignant neoplasm of endometrium: Secondary | ICD-10-CM

## 2023-07-26 LAB — CBC WITH DIFFERENTIAL (CANCER CENTER ONLY)
Abs Immature Granulocytes: 0.04 K/uL (ref 0.00–0.07)
Basophils Absolute: 0.1 K/uL (ref 0.0–0.1)
Basophils Relative: 1 %
Eosinophils Absolute: 0.2 K/uL (ref 0.0–0.5)
Eosinophils Relative: 2 %
HCT: 39.8 % (ref 36.0–46.0)
Hemoglobin: 13.1 g/dL (ref 12.0–15.0)
Immature Granulocytes: 1 %
Lymphocytes Relative: 30 %
Lymphs Abs: 2.6 K/uL (ref 0.7–4.0)
MCH: 30.3 pg (ref 26.0–34.0)
MCHC: 32.9 g/dL (ref 30.0–36.0)
MCV: 92.1 fL (ref 80.0–100.0)
Monocytes Absolute: 0.6 K/uL (ref 0.1–1.0)
Monocytes Relative: 6 %
Neutro Abs: 5.4 K/uL (ref 1.7–7.7)
Neutrophils Relative %: 60 %
Platelet Count: 303 K/uL (ref 150–400)
RBC: 4.32 MIL/uL (ref 3.87–5.11)
RDW: 14.6 % (ref 11.5–15.5)
WBC Count: 8.8 K/uL (ref 4.0–10.5)
nRBC: 0 % (ref 0.0–0.2)

## 2023-07-26 LAB — CMP (CANCER CENTER ONLY)
ALT: 18 U/L (ref 0–44)
AST: 17 U/L (ref 15–41)
Albumin: 3.8 g/dL (ref 3.5–5.0)
Alkaline Phosphatase: 65 U/L (ref 38–126)
Anion gap: 6 (ref 5–15)
BUN: 9 mg/dL (ref 6–20)
CO2: 31 mmol/L (ref 22–32)
Calcium: 8.9 mg/dL (ref 8.9–10.3)
Chloride: 104 mmol/L (ref 98–111)
Creatinine: 0.73 mg/dL (ref 0.44–1.00)
GFR, Estimated: >60 mL/min (ref >60)
Glucose, Bld: 142 mg/dL — ABNORMAL HIGH (ref 70–99)
Potassium: 4.1 mmol/L (ref 3.5–5.1)
Sodium: 141 mmol/L (ref 135–145)
Total Bilirubin: 0.3 mg/dL (ref 0.0–1.2)
Total Protein: 7.8 g/dL (ref 6.5–8.1)

## 2023-07-26 LAB — TSH: TSH: 76.6 u[IU]/mL — ABNORMAL HIGH (ref 0.350–4.500)

## 2023-07-27 ENCOUNTER — Encounter: Payer: Self-pay | Admitting: Hematology and Oncology

## 2023-07-27 ENCOUNTER — Other Ambulatory Visit: Payer: Self-pay

## 2023-07-27 ENCOUNTER — Ambulatory Visit (HOSPITAL_BASED_OUTPATIENT_CLINIC_OR_DEPARTMENT_OTHER): Admitting: Hematology and Oncology

## 2023-07-27 ENCOUNTER — Inpatient Hospital Stay

## 2023-07-27 VITALS — BP 123/90 | HR 91 | Temp 99.0°F | Resp 18 | Wt 244.2 lb

## 2023-07-27 DIAGNOSIS — C541 Malignant neoplasm of endometrium: Secondary | ICD-10-CM

## 2023-07-27 DIAGNOSIS — E039 Hypothyroidism, unspecified: Secondary | ICD-10-CM

## 2023-07-27 DIAGNOSIS — E119 Type 2 diabetes mellitus without complications: Secondary | ICD-10-CM

## 2023-07-27 LAB — T4: T4, Total: 3.1 ug/dL — ABNORMAL LOW (ref 4.5–12.0)

## 2023-07-27 MED ORDER — HEPARIN SOD (PORK) LOCK FLUSH 100 UNIT/ML IV SOLN
500.0000 [IU] | Freq: Once | INTRAVENOUS | Status: AC | PRN
  Administered 2023-07-27: 500 [IU]

## 2023-07-27 MED ORDER — SODIUM CHLORIDE 0.9 % IV SOLN
Freq: Once | INTRAVENOUS | Status: AC
Start: 2023-07-27 — End: 2023-07-27

## 2023-07-27 MED ORDER — LEVOTHYROXINE SODIUM 200 MCG PO TABS
200.0000 ug | ORAL_TABLET | Freq: Every day | ORAL | 0 refills | Status: DC
  Filled 2023-07-27: qty 30, 30d supply, fill #0
  Filled 2023-09-07 (×2): qty 30, 30d supply, fill #1

## 2023-07-27 MED ORDER — SODIUM CHLORIDE 0.9% FLUSH
10.0000 mL | INTRAVENOUS | Status: DC | PRN
  Administered 2023-07-27: 10 mL

## 2023-07-27 MED ORDER — SODIUM CHLORIDE 0.9 % IV SOLN
1000.0000 mg | Freq: Once | INTRAVENOUS | Status: AC
  Administered 2023-07-27: 1000 mg via INTRAVENOUS
  Filled 2023-07-27: qty 20

## 2023-07-27 NOTE — Assessment & Plan Note (Addendum)
 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  Due to allergic reaction, paclitaxel  will discontinue and substituted with docetaxel .  Adjuvant treatment was completed by June 2024 and CT imaging done subsequently showed complete response.  She remained on maintenance immunotherapy without complications  Imaging study from March 09, 2023 showed no evidence of recurrence Treatment course is complicated by excessive weight gain, multifactorial with component contributed by severe hypothyroidism The plan will be to continue maintenance immunotherapy I plan to repeat imaging study again in 6 months, due in September 2025

## 2023-07-27 NOTE — Patient Instructions (Signed)
 [facility] A DEPT OF [facility]  Discharge Instructions: Thank you for choosing [facility] to provide your oncology and hematology care.   If you have a lab appointment with the Cancer Center, please go directly to the Cancer Center and check in at the registration area.   Wear comfortable clothing and clothing appropriate for easy access to any Portacath or PICC line.   We strive to give you quality time with your provider. You may need to reschedule your appointment if you arrive late (15 or more minutes).  Arriving late affects you and other patients whose appointments are after yours.  Also, if you miss three or more appointments without notifying the office, you may be dismissed from the clinic at the provider's discretion.      For prescription refill requests, have your pharmacy contact our office and allow 72 hours for refills to be completed.    Today you received the following chemotherapy and/or immunotherapy agents: Jemperli      To help prevent nausea and vomiting after your treatment, we encourage you to take your nausea medication as directed.  BELOW ARE SYMPTOMS THAT SHOULD BE REPORTED IMMEDIATELY: *FEVER GREATER THAN 100.4 F (38 C) OR HIGHER *CHILLS OR SWEATING *NAUSEA AND VOMITING THAT IS NOT CONTROLLED WITH YOUR NAUSEA MEDICATION *UNUSUAL SHORTNESS OF BREATH *UNUSUAL BRUISING OR BLEEDING *URINARY PROBLEMS (pain or burning when urinating, or frequent urination) *BOWEL PROBLEMS (unusual diarrhea, constipation, pain near the anus) TENDERNESS IN MOUTH AND THROAT WITH OR WITHOUT PRESENCE OF ULCERS (sore throat, sores in mouth, or a toothache) UNUSUAL RASH, SWELLING OR PAIN  UNUSUAL VAGINAL DISCHARGE OR ITCHING   Items with * indicate a potential emergency and should be followed up as soon as possible or go to the Emergency Department if any problems should occur.  Please show the CHEMOTHERAPY ALERT CARD or IMMUNOTHERAPY  ALERT CARD at check-in to the Emergency Department and triage nurse.  Should you have questions after your visit or need to cancel or reschedule your appointment, please contact [facility]  Dept: [phone]  and follow the prompts.  Office hours are 8:00 a.m. to 4:30 p.m. Monday - Friday. Please note that voicemails left after 4:00 p.m. may not be returned until the following business day.  We are closed weekends and major holidays. You have access to a nurse at all times for urgent questions. Please call the main number to the clinic Dept: [phone] and follow the prompts.   For any non-urgent questions, you may also contact your provider using MyChart. We now offer e-Visits for anyone 35 and older to request care online for non-urgent symptoms. For details visit [email or web address].   Also download the MyChart app! Go to the app store, search "MyChart", open the app, select Normal, and log in with your MyChart username and password.

## 2023-07-27 NOTE — Progress Notes (Signed)
 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  Due to allergic reaction, paclitaxel  will discontinue and substituted with docetaxel .  Adjuvant treatment was completed by June 2024 and CT imaging done subsequently showed complete response.  She remained on maintenance immunotherapy without complications  Imaging study from March 09, 2023 showed no evidence of recurrence Treatment course is complicated by excessive weight gain, multifactorial with component contributed by severe hypothyroidism The plan will be to continue maintenance immunotherapy I plan to repeat imaging study again in 6 months, due in September 2025 Acquired hypothyroidism Her recent TSH is elevated I plan to increase the dose of Synthroid  again We discussed importance of compliance taking Synthroid  and I recommend she takes it in the morning rather than the evening Type 2 diabetes mellitus without complication, without long-term current use of insulin  (HCC) She has gained a lot of weight We discussed importance of dietary modification and exercise  No orders of the defined types were placed in this encounter.    Maureen Bedford, MD  INTERVAL HISTORY: she returns for treatment follow-up Complications related to previous cycle of chemotherapy included abnormal thyroid  function  PHYSICAL EXAMINATION: ECOG PERFORMANCE STATUS: 1 - Symptomatic but completely ambulatory  No results found for: CAN125    Latest Ref Rng & Units 07/26/2023   12:21 PM 06/08/2023    8:01 AM 04/27/2023     8:10 AM  CBC  WBC 4.0 - 10.5 K/uL 8.8  7.7  9.0   Hemoglobin 12.0 - 15.0 g/dL 86.8  88.0  87.6   Hematocrit 36.0 - 46.0 % 39.8  36.5  36.8   Platelets 150 - 400 K/uL 303  262  232       Chemistry      Component Value Date/Time   NA 141 07/26/2023 1221   NA 139 02/08/2023 1311   K 4.1 07/26/2023 1221   CL 104 07/26/2023 1221   CO2 31 07/26/2023 1221   BUN 9 07/26/2023 1221   BUN 11 02/08/2023 1311   CREATININE 0.73 07/26/2023 1221      Component Value Date/Time   CALCIUM  8.9 07/26/2023 1221   ALKPHOS 65 07/26/2023 1221   AST 17 07/26/2023 1221   ALT 18 07/26/2023 1221   BILITOT 0.3 07/26/2023 1221       There were no vitals filed for this visit. There were no vitals filed for this visit. Other relevant data reviewed during this visit included CBC, CMP, TSH

## 2023-07-27 NOTE — Assessment & Plan Note (Addendum)
 She has gained a lot of weight We discussed importance of dietary modification and exercise

## 2023-07-27 NOTE — Assessment & Plan Note (Addendum)
 Her recent TSH is elevated I plan to increase the dose of Synthroid  again We discussed importance of compliance taking Synthroid  and I recommend she takes it in the morning rather than the evening

## 2023-08-03 ENCOUNTER — Other Ambulatory Visit: Payer: Self-pay

## 2023-08-09 ENCOUNTER — Other Ambulatory Visit: Payer: Self-pay

## 2023-08-09 ENCOUNTER — Encounter: Payer: Self-pay | Admitting: Hematology and Oncology

## 2023-08-10 ENCOUNTER — Other Ambulatory Visit: Payer: Self-pay

## 2023-08-18 ENCOUNTER — Other Ambulatory Visit: Payer: Self-pay

## 2023-08-31 ENCOUNTER — Telehealth: Payer: Self-pay

## 2023-08-31 NOTE — Telephone Encounter (Signed)
 S/w patient regarding upcoming appointments. Patient confirmed appointments for September 2nd. Patient aware that she will need to arrive 15-minutes early to first appointment to help with the registration process. Patient verbalized an understanding of the information and voiced appreciation for the reminder.

## 2023-09-03 ENCOUNTER — Encounter: Payer: Self-pay | Admitting: Hematology and Oncology

## 2023-09-03 ENCOUNTER — Other Ambulatory Visit: Payer: Self-pay

## 2023-09-03 ENCOUNTER — Other Ambulatory Visit (HOSPITAL_COMMUNITY): Payer: Self-pay

## 2023-09-07 ENCOUNTER — Inpatient Hospital Stay: Attending: Psychiatry

## 2023-09-07 ENCOUNTER — Other Ambulatory Visit: Payer: Self-pay | Admitting: Nurse Practitioner

## 2023-09-07 ENCOUNTER — Inpatient Hospital Stay (HOSPITAL_BASED_OUTPATIENT_CLINIC_OR_DEPARTMENT_OTHER): Admitting: Hematology and Oncology

## 2023-09-07 ENCOUNTER — Other Ambulatory Visit (HOSPITAL_COMMUNITY): Payer: Self-pay

## 2023-09-07 ENCOUNTER — Encounter: Payer: Self-pay | Admitting: Hematology and Oncology

## 2023-09-07 ENCOUNTER — Other Ambulatory Visit: Payer: Self-pay

## 2023-09-07 ENCOUNTER — Inpatient Hospital Stay

## 2023-09-07 VITALS — BP 136/94 | HR 78 | Temp 98.4°F | Resp 18 | Ht 60.0 in | Wt 241.4 lb

## 2023-09-07 DIAGNOSIS — E66813 Obesity, class 3: Secondary | ICD-10-CM | POA: Insufficient documentation

## 2023-09-07 DIAGNOSIS — Z6841 Body Mass Index (BMI) 40.0 and over, adult: Secondary | ICD-10-CM | POA: Insufficient documentation

## 2023-09-07 DIAGNOSIS — C541 Malignant neoplasm of endometrium: Secondary | ICD-10-CM

## 2023-09-07 DIAGNOSIS — Z5112 Encounter for antineoplastic immunotherapy: Secondary | ICD-10-CM | POA: Insufficient documentation

## 2023-09-07 DIAGNOSIS — M25561 Pain in right knee: Secondary | ICD-10-CM

## 2023-09-07 DIAGNOSIS — E039 Hypothyroidism, unspecified: Secondary | ICD-10-CM

## 2023-09-07 DIAGNOSIS — C641 Malignant neoplasm of right kidney, except renal pelvis: Secondary | ICD-10-CM | POA: Insufficient documentation

## 2023-09-07 LAB — CBC WITH DIFFERENTIAL (CANCER CENTER ONLY)
Abs Immature Granulocytes: 0.01 K/uL (ref 0.00–0.07)
Basophils Absolute: 0.1 K/uL (ref 0.0–0.1)
Basophils Relative: 1 %
Eosinophils Absolute: 0.2 K/uL (ref 0.0–0.5)
Eosinophils Relative: 3 %
HCT: 40 % (ref 36.0–46.0)
Hemoglobin: 13.2 g/dL (ref 12.0–15.0)
Immature Granulocytes: 0 %
Lymphocytes Relative: 33 %
Lymphs Abs: 2.3 K/uL (ref 0.7–4.0)
MCH: 29.8 pg (ref 26.0–34.0)
MCHC: 33 g/dL (ref 30.0–36.0)
MCV: 90.3 fL (ref 80.0–100.0)
Monocytes Absolute: 0.5 K/uL (ref 0.1–1.0)
Monocytes Relative: 7 %
Neutro Abs: 3.9 K/uL (ref 1.7–7.7)
Neutrophils Relative %: 56 %
Platelet Count: 309 K/uL (ref 150–400)
RBC: 4.43 MIL/uL (ref 3.87–5.11)
RDW: 14.7 % (ref 11.5–15.5)
WBC Count: 7 K/uL (ref 4.0–10.5)
nRBC: 0 % (ref 0.0–0.2)

## 2023-09-07 LAB — CMP (CANCER CENTER ONLY)
ALT: 38 U/L (ref 0–44)
AST: 27 U/L (ref 15–41)
Albumin: 3.8 g/dL (ref 3.5–5.0)
Alkaline Phosphatase: 56 U/L (ref 38–126)
Anion gap: 5 (ref 5–15)
BUN: 10 mg/dL (ref 6–20)
CO2: 29 mmol/L (ref 22–32)
Calcium: 9.1 mg/dL (ref 8.9–10.3)
Chloride: 105 mmol/L (ref 98–111)
Creatinine: 0.61 mg/dL (ref 0.44–1.00)
GFR, Estimated: >60 mL/min (ref >60)
Glucose, Bld: 131 mg/dL — ABNORMAL HIGH (ref 70–99)
Potassium: 3.8 mmol/L (ref 3.5–5.1)
Sodium: 139 mmol/L (ref 135–145)
Total Bilirubin: 0.3 mg/dL (ref 0.0–1.2)
Total Protein: 7.6 g/dL (ref 6.5–8.1)

## 2023-09-07 LAB — TSH: TSH: 26.5 u[IU]/mL — ABNORMAL HIGH (ref 0.350–4.500)

## 2023-09-07 MED ORDER — SODIUM CHLORIDE 0.9 % IV SOLN: Freq: Once | INTRAVENOUS | Status: AC

## 2023-09-07 MED ORDER — SODIUM CHLORIDE 0.9 % IV SOLN
1000.0000 mg | Freq: Once | INTRAVENOUS | Status: AC
  Administered 2023-09-07: 1000 mg via INTRAVENOUS
  Filled 2023-09-07: qty 20

## 2023-09-07 MED ORDER — PANTOPRAZOLE SODIUM 40 MG PO TBEC
40.0000 mg | DELAYED_RELEASE_TABLET | Freq: Every day | ORAL | 0 refills | Status: DC
  Filled 2023-09-07: qty 5, 5d supply, fill #0

## 2023-09-07 NOTE — Assessment & Plan Note (Addendum)
 She has significant progressive weight gain I went through her diet and discussed dietary modification and encouraged her to increase physical activity as tolerated

## 2023-09-07 NOTE — Patient Instructions (Signed)

## 2023-09-07 NOTE — Assessment & Plan Note (Addendum)
 Her recent TSH is elevated She has significant dose changes of her Synthroid  Repeat TSH is pending today, we will call her with test results and we will continue to adjust as needed

## 2023-09-07 NOTE — Assessment & Plan Note (Addendum)
 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  Due to allergic reaction, paclitaxel  will discontinue and substituted with docetaxel .  Adjuvant treatment was completed by June 2024 and CT imaging done subsequently showed complete response.  She remained on maintenance immunotherapy without complications  Imaging study from March 09, 2023 showed no evidence of recurrence Treatment course is complicated by excessive weight gain, multifactorial with component contributed by severe hypothyroidism The plan will be to continue maintenance immunotherapy I plan to repeat imaging study again in September 2025

## 2023-09-07 NOTE — Progress Notes (Signed)
 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  Due to allergic reaction, paclitaxel  will discontinue and substituted with docetaxel .  Adjuvant treatment was completed by June 2024 and CT imaging done subsequently showed complete response.  She remained on maintenance immunotherapy without complications  Imaging study from March 09, 2023 showed no evidence of recurrence Treatment course is complicated by excessive weight gain, multifactorial with component contributed by severe hypothyroidism The plan will be to continue maintenance immunotherapy I plan to repeat imaging study again in September 2025 Obesity, Class III, BMI 40-49.9 (morbid obesity) She has significant progressive weight gain I went through her diet and discussed dietary modification and encouraged her to increase physical activity as tolerated Acquired hypothyroidism Her recent TSH is elevated She has significant dose changes of her Synthroid  Repeat TSH is pending today, we will call her with test results and we will continue to adjust as needed  No orders of the defined types were placed in this encounter.    Almarie Bedford, MD  INTERVAL HISTORY: she returns for treatment follow-up Complications related to previous cycle of chemotherapy included abnormal thyroid  function and profound weight gain She had recent panic attack and she called EMS but was not hospitalized She has mild intermittent abdominal discomfort that comes and goes She denies side effects from treatment so far  except for abnormal TSH PHYSICAL EXAMINATION: ECOG PERFORMANCE STATUS: 1 - Symptomatic but completely ambulatory  No results found for: CAN125    Latest Ref Rng & Units 09/07/2023    9:27 AM 07/26/2023   12:21 PM 06/08/2023    8:01 AM  CBC  WBC 4.0 - 10.5 K/uL 7.0  8.8  7.7   Hemoglobin 12.0 - 15.0 g/dL 86.7  86.8  88.0   Hematocrit 36.0 - 46.0 % 40.0  39.8  36.5   Platelets 150 - 400 K/uL 309  303  262       Chemistry      Component Value Date/Time   NA 139 09/07/2023 0927   NA 139 02/08/2023 1311   K 3.8 09/07/2023 0927   CL 105 09/07/2023 0927   CO2 29 09/07/2023 0927   BUN 10 09/07/2023 0927   BUN 11 02/08/2023 1311   CREATININE 0.61 09/07/2023 0927      Component Value Date/Time   CALCIUM  9.1 09/07/2023 0927   ALKPHOS 56 09/07/2023 0927   AST 27 09/07/2023 0927   ALT 38 09/07/2023 0927   BILITOT 0.3 09/07/2023 0927       Vitals:   09/07/23 0954  BP: (!) 136/94  Pulse: 78  Resp: 18  Temp: 98.4 F (36.9 C)  SpO2: 96%   Filed Weights   09/07/23 0954  Weight: 241 lb 6.4 oz (109.5 kg)   Other relevant data reviewed during this visit included CBC, CMP, TSH

## 2023-09-08 ENCOUNTER — Other Ambulatory Visit: Payer: Self-pay

## 2023-09-08 LAB — T4: T4, Total: 7.9 ug/dL (ref 4.5–12.0)

## 2023-09-10 ENCOUNTER — Other Ambulatory Visit: Payer: Self-pay

## 2023-09-10 DIAGNOSIS — E119 Type 2 diabetes mellitus without complications: Secondary | ICD-10-CM

## 2023-09-10 NOTE — Progress Notes (Signed)
 Dr. Lonn confirmed that patient has completed course of Eliquis . Will remove from her medication list.   Maureen Campos, PharmD [facility] [phone]

## 2023-09-10 NOTE — Progress Notes (Signed)
 09/10/2023 Name: Maureen Campos MRN: [id number] DOB: [birth year]  Chief Complaint  Patient presents with   Diabetes   Hypertension   Hyperlipidemia    Maureen Campos is a 53 y.o. year old female who presented for a telephone visit.   They were referred to the pharmacist by their PCP for assistance in managing diabetes. PMH includes T2DM, acute embolism from R jugular vein, endometrial cancer on preventative immunotherapy, obesity, HLD  Subjective: Patient was last engaged by pharmacy via telephone on 06/15/23. At that time, patient reported tolerating Trulicity  well. Most recent A1C was controlled at 6.5%. She reported that she was not taking atorvastatin  consistently due to concern about taking so many pills. She was counseled on the importance of adherence. No medication changes were made. Patient was seen by PCP Lorice Shall, NP on 06/28/23. BP was elevated to 149/85 mmHg, though she was not taking valsartan  consistently at that time.   She reports she is doing ok. She has an upper respiratory illness that has been going on for the past two days. She also had to call 911 over the weekend for chest tightness and ShOB - but determined that this was a panic attack possibly induced by being overactive, having a lot going (cooked a lot for her husband's birthday that day). Also feels that her having more weight gain recently could have contributed. She has not been checking her BG and BP recently.   Care Team: Primary Care Provider: [name], [name], FNP ; Next Scheduled Visit: 09/28/23 Oncologists: Dr. Lonn  Medication Access/Adherence  Current Pharmacy:  [facility] [address], [address] [place] [address] Phone: [phone] Fax: [phone]   Patient reports affordability concerns with their medications: Yes  - getting Trulicity  via [facility] supply Patient reports access/transportation concerns to their pharmacy: No   Patient reports adherence concerns with their medications:  No   - reports that she does forget her valsartan  dose sometimes. Usually takes pills in afternoon around 3-4 PM. Denies missed doses of Trulicity .    Diabetes:  Current medications: metformin  1000 mg PO BID, Trulicity  4.5 mg subcutaneous once weekly (Tuesdays) Medications tried in the past: none  Tolerating Trulicity  well - no diarrhea, nausea, or vomiting.   Patient denies hypoglycemic s/sx including shakiness, sweating. Patient denies hyperglycemic symptoms including polyuria, polydipsia, polyphagia, nocturia, neuropathy.   Patient is not checking BG regularly.  Current meal patterns: Eats 2 meals/day, but does snack during the day - Breakfast: boiled egg - Lunch: eggs, beans, cheese, guacamole, small tortillas - Supper: beans, meat, onion  - Snacks: vegetables, fruits - Drinks: drinking more wate  Hyperlipidemia/ASCVD Risk Reduction  Current lipid lowering medications: atorvastatin  80 mg PO daily  Family History: no known cardiac hx Risk Factors: DM, HTN  Hypertension:  Current medications: valsartan  80 mg daily (reports she does not take this every day) Medications previously tried: none  Patient has a validated, automated, upper arm home BP cuff Current blood pressure readings readings: has not checked recently, but recent clinic readings have been more elevated  Patient reports hypotensive s/sx including dizziness - usually in the morning before eating. Resolves after eating.  Patient denies hypertensive symptoms including headache, chest pain, shortness of breath except in the setting of her recent panic attack.   Current medication assistance:  - Eliquis  BMS approved 06/04/22 to 06/03/23   Clinical ASCVD: No  The 10-year ASCVD risk score (Arnett DK, et al., 2019) is: 4.1%  Values used to calculate the score:     Age: 32 years     Clincally relevant sex: Female     Is Non-Hispanic African American:  No     Diabetic: Yes     Tobacco smoker: No     Systolic Blood Pressure: 136 mmHg     Is BP treated: Yes     HDL Cholesterol: 39 mg/dL     Total Cholesterol: 149 mg/dL    Objective:  BP Readings from Last 3 Encounters:  09/07/23 (!) 136/94  07/27/23 (!) 123/90  06/28/23 (!) 149/85   UACR 06/15/22: 59 mg/g  Lab Results  Component Value Date   HGBA1C 6.5 (A) 05/03/2023    Lab Results  Component Value Date   CREATININE 0.61 09/07/2023   BUN 10 09/07/2023   NA 139 09/07/2023   K 3.8 09/07/2023   CL 105 09/07/2023   CO2 29 09/07/2023    Lab Results  Component Value Date   CHOL 149 06/28/2023   HDL 39 (L) 06/28/2023   LDLCALC 77 06/28/2023   LDLDIRECT 75 01/05/2023   TRIG 195 (H) 06/28/2023   CHOLHDL 3.8 06/28/2023    Medications Reviewed Today   Medications were not reviewed in this encounter     Assessment/Plan:   Diabetes: - Currently controlled with last A1C 6.5% (down from 7.8%) on 05/03/23. Patient continues to tolerate Trulicity  well, though no SMBG data available today to confirm continued control.  - Last UACR 06/28/23: 22 mg/g (previously 59 mg/g) - Reviewed long term cardiovascular and renal outcomes of uncontrolled blood sugar - Reviewed goal A1c, goal fasting, and goal 2 hour post prandial glucose - Recommend to continue Trulicity  4.5 mg weekly - Recommend to continue metformin  1000 mg BID  - Patient denies personal or family history of multiple endocrine neoplasia type 2, medullary thyroid  cancer; personal history of pancreatitis or gallbladder disease. - Recommend to check fasting blood glucose a few times a week and as needed - UACR due at upcoming PCP visit  Hypertension: - Currently uncontrolled with recent clinic readings consistently above goal < 130/80 mmHg. However, patient endorses that she does occasionally miss doses of her medication.  - Reviewed long term cardiovascular and renal outcomes of uncontrolled blood pressure - Reviewed  appropriate blood pressure monitoring technique and reviewed goal blood pressure. Recommended to check home blood pressure and heart rate periodically. - Recommend to continue valsartan  80 mg PO daily. Consider increasing to 160 mg daily if next clinic BP is uncontrolled and Scr, K are WNL/stable.  - Counseled on proper home monitoring technique  Hyperlipidemia/ASCVD Risk Reduction: - Currently uncontrolled with LDL-C of 77 mg/dL above goal < 70 mg/dL given concomitant metabolic syndrome, but improved from 131 mg/dL. TG also improved  to 195 mg/gL from 258 mg/dL. Emphasized adherence to statin, but could consider escalating to 80 mg daily in the future. - Recommend to continue atorvastatin  40 mg PO daily   Hypothyroidism:  Last TSH on 09/07/23 elevated to 26 uIU/mL, in the setting of continued weight gain. This was checked by oncology, who has increased her dose up to 200 mcg daily (since 08/03/23). Patient did report that she takes levothyroxine  before breakfast without any other food or medications, but did not specifically state how frequently she misses doses. Fill hx indicates adherence.  DVT:  In March 2024, patient was found to have acute DVT in the R internal jugular vein, R subclavian vein, and R brachial veins. She was initiated on Eliquis   and approved from BMS PAP from 06/04/22 to 06/03/23, which was prescribed and initiated by oncology (Dr. Lonn). Will outreach provider to determine whether continued treatment is required to facilitate access.  Follow Up Plan: PCP 09/28/23 and pharmacist telephone 05/11/23  Lorain Baseman, PharmD [facility] [phone]

## 2023-09-10 NOTE — Addendum Note (Signed)
 Addended by: BRINDA LORAIN SQUIBB on: 09/10/2023 02:24 PM   Modules accepted: Orders

## 2023-09-11 ENCOUNTER — Other Ambulatory Visit: Payer: Self-pay

## 2023-09-16 ENCOUNTER — Other Ambulatory Visit: Payer: Self-pay

## 2023-09-21 ENCOUNTER — Encounter: Payer: Self-pay | Admitting: Hematology and Oncology

## 2023-09-21 ENCOUNTER — Other Ambulatory Visit: Payer: Self-pay

## 2023-09-28 ENCOUNTER — Encounter: Payer: Self-pay | Admitting: Nurse Practitioner

## 2023-09-28 ENCOUNTER — Encounter: Payer: Self-pay | Admitting: Hematology and Oncology

## 2023-09-28 ENCOUNTER — Other Ambulatory Visit: Payer: Self-pay | Admitting: Hematology and Oncology

## 2023-09-28 ENCOUNTER — Ambulatory Visit (INDEPENDENT_AMBULATORY_CARE_PROVIDER_SITE_OTHER): Payer: Self-pay | Admitting: Nurse Practitioner

## 2023-09-28 ENCOUNTER — Other Ambulatory Visit: Payer: Self-pay

## 2023-09-28 ENCOUNTER — Telehealth: Payer: Self-pay | Admitting: Oncology

## 2023-09-28 VITALS — BP 123/84 | HR 80 | Wt 241.0 lb

## 2023-09-28 DIAGNOSIS — C541 Malignant neoplasm of endometrium: Secondary | ICD-10-CM

## 2023-09-28 DIAGNOSIS — E119 Type 2 diabetes mellitus without complications: Secondary | ICD-10-CM

## 2023-09-28 DIAGNOSIS — I10 Essential (primary) hypertension: Secondary | ICD-10-CM

## 2023-09-28 DIAGNOSIS — E039 Hypothyroidism, unspecified: Secondary | ICD-10-CM

## 2023-09-28 DIAGNOSIS — E785 Hyperlipidemia, unspecified: Secondary | ICD-10-CM

## 2023-09-28 LAB — POCT GLYCOSYLATED HEMOGLOBIN (HGB A1C): Hemoglobin A1C: 6.6 % — AB (ref 4.0–5.6)

## 2023-09-28 NOTE — Progress Notes (Signed)
 Established Patient Office Visit  Subjective:  Patient ID: Maureen Campos, female    DOB: [birth year]  Age: 53 y.o. MRN: [id number]  CC:  Chief Complaint  Patient presents with   Hypertension   Diabetes    HPI   Discussed the use of AI scribe software for clinical note transcription with the patient, who gave verbal consent to proceed.  History of Present Illness Maureen Campos is a 53 year old female  has a past medical history of Acute embolism from right internal jugular vein (HCC), Diabetes mellitus without complication (HCC), Dyspnea, Endometrial cancer (HCC), Family history of colon cancer (03/04/2022), Family history of uterine cancer, Fast heart beat, Fatigue, Hearing loss, History of kidney stones, Lazy eye, left, Left leg numbness, and Migraine.   who presents for routine follow-up.  Her diabetes management is stable with a recent A1c of 6.6%, slightly increased from 6.5% four months ago. She is currently taking Trulicity  4.5 mg daily and metformin  1000 mg twice daily.  Her blood pressure readings have been good, with recent measurements of 131/74 and 123/84. She takes valsartan  80 mg daily for hypertension.She has not checked her blood pressure at home for about three weeks but feels generally well.   For hyperlipidemia, she is on atorvastatin  80 mg daily. Her LDL was 77 three months ago.  Regarding hypothyroidism, she is on levothyroxine  200 mcg daily. Her TSH levels have been improving  She experiences nocturnal cramps, particularly during sleep, but denies any claudication or history of ulcers or wounds on her feet. She has started going to the gym with her daughter, walking on the treadmill three times a week, gradually increasing her duration from 15 to 30 minutes, with a goal of reaching an hour. She attributes some of her nighttime cramps to her weight and sleeping position, noting she often sleeps on one side.  Assessment & Plan    Past Medical History:   Diagnosis Date   Acute embolism from right internal jugular vein (HCC)    Diabetes mellitus without complication (HCC)    Dyspnea    with activity   Endometrial cancer (HCC)    Family history of colon cancer 03/04/2022   Family history of uterine cancer    Fast heart beat    with activity   Fatigue    Hearing loss    Left ear   History of kidney stones    12 years ago   Lazy eye, left    Left leg numbness    occ   Migraine    occ    Past Surgical History:  Procedure Laterality Date   CHOLECYSTECTOMY  2011   IR CHEST FLUORO  04/07/2022   IR CV LINE INJECTION  04/09/2022   IR IMAGING GUIDED PORT INSERTION  03/05/2022   IR PORT REPAIR CENTRAL VENOUS ACCESS DEVICE  04/09/2022   IR US  GUIDE VASC ACCESS RIGHT  04/09/2022   ROBOTIC ASSISTED TOTAL HYSTERECTOMY WITH BILATERAL SALPINGO OOPHERECTOMY N/A 02/10/2022   Procedure: XI ROBOTIC ASSISTED TOTAL HYSTERECTOMY WITH BILATERAL SALPINGO OOPHORECTOMY;  Surgeon: Eldonna Mays, MD;  Location: [facility];  Service: Gynecology;  Laterality: N/A;   ROBOTIC PELVIC AND PARA-AORTIC LYMPH NODE DISSECTION N/A 02/10/2022   Procedure: XI ROBOTIC PELVIC AND PARA-AORTIC SENTINEL LYMPH NODE DISSECTION;  Surgeon: Eldonna Mays, MD;  Location: [facility];  Service: Gynecology;  Laterality: N/A;    Family History  Problem Relation Age of Onset   Colon cancer Father 75   Uterine cancer Sister 31  Melanoma Sister 22   Cancer Paternal Aunt        possible brain cancer, dx > 50   Breast cancer Neg Hx    Ovarian cancer Neg Hx    Endometrial cancer Neg Hx    Pancreatic cancer Neg Hx    Prostate cancer Neg Hx     Social History   Socioeconomic History   Marital status: Significant Other    Spouse name: Not on file   Number of children: 4   Years of education: Not on file   Highest education level: Not on file  Occupational History   Not on file  Tobacco Use   Smoking status: Never   Smokeless tobacco: Never  Vaping Use   Vaping status: Never Used   Substance and Sexual Activity   Alcohol use: Not Currently    Comment: every weekend   Drug use: No   Sexual activity: Yes    Birth control/protection: None  Other Topics Concern   Not on file  Social History Narrative   Lives with her daughter   Social Drivers of Health   Financial Resource Strain: Not on file  Food Insecurity: Food Insecurity Present (07/28/2022)   Hunger Vital Sign    Worried About Running Out of Food in the Last Year: Sometimes true    Ran Out of Food in the Last Year: Sometimes true  Transportation Needs: No Transportation Needs (07/29/2022)   PRAPARE - Administrator, Civil Service (Medical): No    Lack of Transportation (Non-Medical): No  Physical Activity: Not on file  Stress: Not on file  Social Connections: Not on file  Intimate Partner Violence: Not At Risk (07/29/2022)   Humiliation, Afraid, Rape, and Kick questionnaire    Fear of Current or Ex-Partner: No    Emotionally Abused: No    Physically Abused: No    Sexually Abused: No    Outpatient Medications Prior to Visit  Medication Sig Dispense Refill   atorvastatin  (LIPITOR) 80 MG tablet Take 1 tablet (80 mg total) by mouth daily. 90 tablet 3   Blood Glucose Monitoring Suppl DEVI 1 each by Does not apply route in the morning, at noon, and at bedtime. May substitute to any manufacturer covered by patient's insurance. 1 each 0   Dulaglutide  (TRULICITY ) 4.5 MG/0.5ML SOAJ Inject 4.5 mg as directed once a week. 2 mL 5   levothyroxine  (SYNTHROID ) 200 MCG tablet Take 1 tablet (200 mcg total) by mouth daily before breakfast. 60 tablet 0   lidocaine -prilocaine  (EMLA ) cream Apply to affected area once 30 g 3   metFORMIN  (GLUCOPHAGE ) 1000 MG tablet Take 1 tablet (1,000 mg total) by mouth 2 (two) times daily with a meal. 180 tablet 3   ondansetron  (ZOFRAN ) 8 MG tablet Take 1 tablet (8 mg total) by mouth every 8 (eight) hours as needed for nausea or vomiting. Start on the third day after  chemotherapy. 30 tablet 1   pantoprazole  (PROTONIX ) 40 MG tablet Take 1 tablet (40 mg total) by mouth daily. 5 tablet 0   prochlorperazine  (COMPAZINE ) 10 MG tablet Take 1 tablet (10 mg total) by mouth every 6 (six) hours as needed for nausea or vomiting. 30 tablet 1   valsartan  (DIOVAN ) 80 MG tablet Take 1 tablet (80 mg total) by mouth daily. 90 tablet 1   triamcinolone  cream (KENALOG ) 0.1 % Apply 1 Application topically 2 (two) times daily. (Patient not taking: Reported on 09/28/2023) 30 g 1   No facility-administered medications  prior to visit.    Allergies  Allergen Reactions   Paclitaxel  Shortness Of Breath and Other (See Comments)    See progress note from 04/10/22   Advil  [Ibuprofen ] Itching and Rash    ROS Review of Systems  Constitutional:  Negative for appetite change, chills, fatigue and fever.  HENT:  Negative for congestion, postnasal drip, rhinorrhea and sneezing.   Respiratory:  Negative for cough, shortness of breath and wheezing.   Cardiovascular:  Negative for chest pain, palpitations and leg swelling.  Gastrointestinal:  Negative for abdominal pain, constipation, nausea and vomiting.  Genitourinary:  Negative for difficulty urinating, dysuria, flank pain and frequency.  Musculoskeletal:  Negative for arthralgias, back pain, joint swelling and myalgias.  Skin:  Negative for color change, pallor, rash and wound.  Neurological:  Negative for dizziness, facial asymmetry, weakness, numbness and headaches.  Psychiatric/Behavioral:  Negative for behavioral problems, confusion, self-injury and suicidal ideas.       Objective:    Physical Exam Vitals and nursing note reviewed.  Constitutional:      General: She is not in acute distress.    Appearance: Normal appearance. She is obese. She is not ill-appearing, toxic-appearing or diaphoretic.  HENT:     Mouth/Throat:     Pharynx: No posterior oropharyngeal erythema.  Eyes:     General: No scleral icterus.       Right  eye: No discharge.        Left eye: No discharge.     Extraocular Movements: Extraocular movements intact.     Conjunctiva/sclera: Conjunctivae normal.  Cardiovascular:     Rate and Rhythm: Normal rate and regular rhythm.     Pulses: Normal pulses.     Heart sounds: Normal heart sounds. No murmur heard.    No friction rub. No gallop.  Pulmonary:     Effort: Pulmonary effort is normal. No respiratory distress.     Breath sounds: Normal breath sounds. No stridor. No wheezing, rhonchi or rales.  Chest:     Chest wall: No tenderness.  Abdominal:     General: There is no distension.     Palpations: Abdomen is soft.     Tenderness: There is no abdominal tenderness. There is no right CVA tenderness, left CVA tenderness or guarding.  Musculoskeletal:        General: No swelling, tenderness, deformity or signs of injury.     Right lower leg: No edema.     Left lower leg: No edema.  Skin:    General: Skin is warm and dry.     Capillary Refill: Capillary refill takes less than 2 seconds.     Coloration: Skin is not jaundiced or pale.     Findings: No bruising, erythema or lesion.  Neurological:     Mental Status: She is alert and oriented to person, place, and time.     Motor: No weakness.     Gait: Gait normal.  Psychiatric:        Mood and Affect: Mood normal.        Behavior: Behavior normal.        Thought Content: Thought content normal.        Judgment: Judgment normal.     BP 123/84   Pulse 80   Wt 241 lb (109.3 kg)   LMP 01/06/2022 Comment: Hysterectomy 02/10/22  SpO2 95%   BMI 47.07 kg/m  Wt Readings from Last 3 Encounters:  09/28/23 241 lb (109.3 kg)  09/07/23 241 lb 6.4 oz (  109.5 kg)  07/27/23 244 lb 4 oz (110.8 kg)    Lab Results  Component Value Date   TSH 26.500 (H) 09/07/2023   Lab Results  Component Value Date   WBC 7.0 09/07/2023   HGB 13.2 09/07/2023   HCT 40.0 09/07/2023   MCV 90.3 09/07/2023   PLT 309 09/07/2023   Lab Results  Component Value  Date   NA 139 09/07/2023   K 3.8 09/07/2023   CO2 29 09/07/2023   GLUCOSE 131 (H) 09/07/2023   BUN 10 09/07/2023   CREATININE 0.61 09/07/2023   BILITOT 0.3 09/07/2023   ALKPHOS 56 09/07/2023   AST 27 09/07/2023   ALT 38 09/07/2023   PROT 7.6 09/07/2023   ALBUMIN 3.8 09/07/2023   CALCIUM  9.1 09/07/2023   ANIONGAP 5 09/07/2023   EGFR 112 02/08/2023   Lab Results  Component Value Date   CHOL 149 06/28/2023   Lab Results  Component Value Date   HDL 39 (L) 06/28/2023   Lab Results  Component Value Date   LDLCALC 77 06/28/2023   Lab Results  Component Value Date   TRIG 195 (H) 06/28/2023   Lab Results  Component Value Date   CHOLHDL 3.8 06/28/2023   Lab Results  Component Value Date   HGBA1C 6.6 (A) 09/28/2023      Assessment & Plan:   Problem List Items Addressed This Visit       Cardiovascular and Mediastinum   High blood pressure    Blood pressure borderline controlled; goal is less than 130/80. -Continue valsartan  80 mg daily encouraged to monitor blood pressure at home Discussed DASH diet and dietary sodium restrictions Continue to increase dietary efforts and exercise.   Keep upcoming appointment with the clinical pharmacist     09/28/2023   11:21 AM 09/28/2023   11:10 AM 09/07/2023    9:54 AM 07/27/2023    8:09 AM 06/28/2023    2:43 PM 06/28/2023    2:32 PM 06/08/2023   10:08 AM  BP/Weight  Systolic BP 123 131 136 123 149 157 125  Diastolic BP 84 74 94 90 85 81 79  Wt. (Lbs)  241 241.4 244.25  242   BMI  47.07 kg/m2 47.15 kg/m2 47.7 kg/m2  47.26 kg/m2              Endocrine   Diabetes mellitus type 2, uncomplicated (HCC) - Primary    Diabetes well-controlled with A1c of 6.6. - Continue Trulicity  4.5 mg daily. - Continue Metformin  1000 mg twice daily. Patient counseled on low-carb diet, up-to-date with diabetic eye exam records requested Diabetic foot exam completed       Relevant Orders   POCT glycosylated hemoglobin (Hb A1C)  (Completed)   Lipid panel   Acquired hypothyroidism   Continue levothyroxine  200 mcg daily Dr. Lonn has been assisting with management        Other   Dyslipidemia, goal LDL below 70   Lab Results  Component Value Date   CHOL 149 06/28/2023   HDL 39 (L) 06/28/2023   LDLCALC 77 06/28/2023   LDLDIRECT 75 01/05/2023   TRIG 195 (H) 06/28/2023   CHOLHDL 3.8 06/28/2023    LDL slightly above goal at 77; managed with Atorvastatin . - Continue Atorvastatin  80 mg daily. - Recheck cholesterol levels. Avoid fatty fried foods           No orders of the defined types were placed in this encounter.   Follow-up: Return in about 3 months (around  12/28/2023).    Mei Suits R Bee Hammerschmidt, FNP

## 2023-09-28 NOTE — Telephone Encounter (Signed)
 Called [place] and was not able to leave a message.  Called her daughter, Voncille and left a message that [place] can call to schedule her CT to be done in 2 weeks.

## 2023-09-28 NOTE — Patient Instructions (Addendum)
Around 3 times per week, check your blood pressure 2 times per day. once in the morning and once in the evening. The readings should be at least one minute apart. Write down these values and bring them to your next nurse visit/appointment.  When you check your BP, make sure you have been doing something calm/relaxing 5 minutes prior to checking. Both feet should be flat on the floor and you should be sitting. Use your left arm and make sure it is in a relaxed position (on a table), and that the cuff is at the approximate level/height of your heart.   Blood pressure goal is less than 130/80.    It is important that you exercise regularly at least 30 minutes 5 times a week as tolerated  Think about what you will eat, plan ahead. Choose " clean, green, fresh or frozen" over canned, processed or packaged foods which are more sugary, salty and fatty. 70 to 75% of food eaten should be vegetables and fruit. Three meals at set times with snacks allowed between meals, but they must be fruit or vegetables. Aim to eat over a 12 hour period , example 7 am to 7 pm, and STOP after  your last meal of the day. Drink water,generally about 64 ounces per day, no other drink is as healthy. Fruit juice is best enjoyed in a healthy way, by EATING the fruit.  Thanks for choosing [facility] we consider it a privelige to serve you.

## 2023-09-28 NOTE — Assessment & Plan Note (Signed)
 Continue levothyroxine  200 mcg daily Dr. Lonn has been assisting with management

## 2023-09-28 NOTE — Assessment & Plan Note (Signed)
  Blood pressure borderline controlled; goal is less than 130/80. -Continue valsartan  80 mg daily encouraged to monitor blood pressure at home Discussed DASH diet and dietary sodium restrictions Continue to increase dietary efforts and exercise.   Keep upcoming appointment with the clinical pharmacist     09/28/2023   11:21 AM 09/28/2023   11:10 AM 09/07/2023    9:54 AM 07/27/2023    8:09 AM 06/28/2023    2:43 PM 06/28/2023    2:32 PM 06/08/2023   10:08 AM  BP/Weight  Systolic BP 123 131 136 123 149 157 125  Diastolic BP 84 74 94 90 85 81 79  Wt. (Lbs)  241 241.4 244.25  242   BMI  47.07 kg/m2 47.15 kg/m2 47.7 kg/m2  47.26 kg/m2

## 2023-09-28 NOTE — Assessment & Plan Note (Signed)
 Lab Results  Component Value Date   CHOL 149 06/28/2023   HDL 39 (L) 06/28/2023   LDLCALC 77 06/28/2023   LDLDIRECT 75 01/05/2023   TRIG 195 (H) 06/28/2023   CHOLHDL 3.8 06/28/2023    LDL slightly above goal at 77; managed with Atorvastatin . - Continue Atorvastatin  80 mg daily. - Recheck cholesterol levels. Avoid fatty fried foods

## 2023-09-28 NOTE — Assessment & Plan Note (Signed)
  Diabetes well-controlled with A1c of 6.6. - Continue Trulicity  4.5 mg daily. - Continue Metformin  1000 mg twice daily. Patient counseled on low-carb diet, up-to-date with diabetic eye exam records requested Diabetic foot exam completed

## 2023-09-29 ENCOUNTER — Encounter: Payer: Self-pay | Admitting: Hematology and Oncology

## 2023-09-29 ENCOUNTER — Ambulatory Visit: Payer: Self-pay | Admitting: Nurse Practitioner

## 2023-09-29 ENCOUNTER — Other Ambulatory Visit: Payer: Self-pay

## 2023-09-29 DIAGNOSIS — E785 Hyperlipidemia, unspecified: Secondary | ICD-10-CM

## 2023-09-29 LAB — LIPID PANEL
Chol/HDL Ratio: 4.9 ratio — ABNORMAL HIGH (ref 0.0–4.4)
Cholesterol, Total: 178 mg/dL (ref 100–199)
HDL: 36 mg/dL — ABNORMAL LOW (ref >39)
LDL Chol Calc (NIH): 102 mg/dL — ABNORMAL HIGH (ref 0–99)
Triglycerides: 231 mg/dL — ABNORMAL HIGH (ref 0–149)
VLDL Cholesterol Cal: 40 mg/dL (ref 5–40)

## 2023-09-29 MED ORDER — EZETIMIBE 10 MG PO TABS
10.0000 mg | ORAL_TABLET | Freq: Every day | ORAL | 3 refills | Status: DC
  Filled 2023-09-29 – 2023-09-30 (×2): qty 90, 90d supply, fill #0

## 2023-09-30 ENCOUNTER — Encounter: Payer: Self-pay | Admitting: Hematology and Oncology

## 2023-09-30 ENCOUNTER — Other Ambulatory Visit: Payer: Self-pay

## 2023-10-05 ENCOUNTER — Other Ambulatory Visit: Payer: Self-pay

## 2023-10-06 ENCOUNTER — Encounter: Payer: Self-pay | Admitting: Hematology and Oncology

## 2023-10-12 ENCOUNTER — Ambulatory Visit (HOSPITAL_COMMUNITY)
Admission: RE | Admit: 2023-10-12 | Discharge: 2023-10-12 | Disposition: A | Discharge status: Home / Self Care | Payer: Self-pay | Source: Ambulatory Visit | Attending: Hematology and Oncology | Admitting: Hematology and Oncology

## 2023-10-12 DIAGNOSIS — C541 Malignant neoplasm of endometrium: Secondary | ICD-10-CM | POA: Insufficient documentation

## 2023-10-12 MED ORDER — IOHEXOL 300 MG/ML  SOLN
100.0000 mL | Freq: Once | INTRAMUSCULAR | Status: AC | PRN
  Administered 2023-10-12: 100 mL via INTRAVENOUS

## 2023-10-12 MED ORDER — SODIUM CHLORIDE (PF) 0.9 % IJ SOLN
INTRAMUSCULAR | Status: AC
  Filled 2023-10-12: qty 50

## 2023-10-19 ENCOUNTER — Inpatient Hospital Stay

## 2023-10-19 ENCOUNTER — Inpatient Hospital Stay: Attending: Psychiatry

## 2023-10-19 ENCOUNTER — Encounter: Payer: Self-pay | Admitting: Hematology and Oncology

## 2023-10-19 ENCOUNTER — Telehealth: Payer: Self-pay

## 2023-10-19 ENCOUNTER — Other Ambulatory Visit: Payer: Self-pay

## 2023-10-19 ENCOUNTER — Ambulatory Visit (HOSPITAL_COMMUNITY)
Admission: RE | Admit: 2023-10-19 | Discharge: 2023-10-19 | Disposition: A | Discharge status: Home / Self Care | Source: Ambulatory Visit | Attending: Hematology and Oncology | Admitting: Hematology and Oncology

## 2023-10-19 ENCOUNTER — Inpatient Hospital Stay (HOSPITAL_BASED_OUTPATIENT_CLINIC_OR_DEPARTMENT_OTHER): Admitting: Hematology and Oncology

## 2023-10-19 VITALS — BP 138/75 | HR 69 | Temp 97.8°F | Resp 20

## 2023-10-19 VITALS — BP 136/90 | HR 90 | Temp 99.5°F | Resp 18 | Ht 60.0 in | Wt 243.8 lb

## 2023-10-19 DIAGNOSIS — Z5112 Encounter for antineoplastic immunotherapy: Secondary | ICD-10-CM | POA: Insufficient documentation

## 2023-10-19 DIAGNOSIS — E66813 Obesity, class 3: Secondary | ICD-10-CM

## 2023-10-19 DIAGNOSIS — M79604 Pain in right leg: Secondary | ICD-10-CM | POA: Insufficient documentation

## 2023-10-19 DIAGNOSIS — C541 Malignant neoplasm of endometrium: Secondary | ICD-10-CM

## 2023-10-19 LAB — CMP (CANCER CENTER ONLY)
ALT: 18 U/L (ref 0–44)
AST: 17 U/L (ref 15–41)
Albumin: 3.9 g/dL (ref 3.5–5.0)
Alkaline Phosphatase: 63 U/L (ref 38–126)
Anion gap: 5 (ref 5–15)
BUN: 17 mg/dL (ref 6–20)
CO2: 29 mmol/L (ref 22–32)
Calcium: 9.8 mg/dL (ref 8.9–10.3)
Chloride: 105 mmol/L (ref 98–111)
Creatinine: 0.49 mg/dL (ref 0.44–1.00)
GFR, Estimated: >60 mL/min (ref >60)
Glucose, Bld: 132 mg/dL — ABNORMAL HIGH (ref 70–99)
Potassium: 4 mmol/L (ref 3.5–5.1)
Sodium: 139 mmol/L (ref 135–145)
Total Bilirubin: 0.3 mg/dL (ref 0.0–1.2)
Total Protein: 7.4 g/dL (ref 6.5–8.1)

## 2023-10-19 LAB — CBC WITH DIFFERENTIAL (CANCER CENTER ONLY)
Abs Immature Granulocytes: 0.02 K/uL (ref 0.00–0.07)
Basophils Absolute: 0.1 K/uL (ref 0.0–0.1)
Basophils Relative: 1 %
Eosinophils Absolute: 0.2 K/uL (ref 0.0–0.5)
Eosinophils Relative: 2 %
HCT: 39.7 % (ref 36.0–46.0)
Hemoglobin: 12.9 g/dL (ref 12.0–15.0)
Immature Granulocytes: 0 %
Lymphocytes Relative: 23 %
Lymphs Abs: 2.1 K/uL (ref 0.7–4.0)
MCH: 28.8 pg (ref 26.0–34.0)
MCHC: 32.5 g/dL (ref 30.0–36.0)
MCV: 88.6 fL (ref 80.0–100.0)
Monocytes Absolute: 0.6 K/uL (ref 0.1–1.0)
Monocytes Relative: 7 %
Neutro Abs: 6.2 K/uL (ref 1.7–7.7)
Neutrophils Relative %: 67 %
Platelet Count: 271 K/uL (ref 150–400)
RBC: 4.48 MIL/uL (ref 3.87–5.11)
RDW: 14.6 % (ref 11.5–15.5)
WBC Count: 9.2 K/uL (ref 4.0–10.5)
nRBC: 0 % (ref 0.0–0.2)

## 2023-10-19 LAB — TSH: TSH: 31.5 u[IU]/mL — ABNORMAL HIGH (ref 0.350–4.500)

## 2023-10-19 MED ORDER — SODIUM CHLORIDE 0.9 % IV SOLN
1000.0000 mg | Freq: Once | INTRAVENOUS | Status: AC
  Administered 2023-10-19: 1000 mg via INTRAVENOUS
  Filled 2023-10-19: qty 20

## 2023-10-19 MED ORDER — SODIUM CHLORIDE 0.9 % IV SOLN: Freq: Once | INTRAVENOUS | Status: AC

## 2023-10-19 MED ORDER — SODIUM CHLORIDE 0.9% FLUSH
10.0000 mL | INTRAVENOUS | Status: DC | PRN
  Administered 2023-10-19: 10 mL

## 2023-10-19 MED ORDER — LEVOTHYROXINE SODIUM 50 MCG PO TABS
50.0000 ug | ORAL_TABLET | Freq: Every day | ORAL | 0 refills | Status: DC
  Filled 2023-10-19: qty 60, 60d supply, fill #0

## 2023-10-19 MED ORDER — LIDOCAINE-PRILOCAINE 2.5-2.5 % EX CREA
TOPICAL_CREAM | CUTANEOUS | 3 refills | Status: DC
  Filled 2023-10-19: qty 30, 30d supply, fill #0

## 2023-10-19 NOTE — Assessment & Plan Note (Addendum)
 She had recent travel to [place] , car ride and complaining of acute pain in the medial aspect of her right knee near the ligament area Her examination is benign Due to risk of blood clot, I will order urgent venous Doppler ultrasound today

## 2023-10-19 NOTE — Assessment & Plan Note (Addendum)
 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  Due to allergic reaction, paclitaxel  will discontinue and substituted with docetaxel .  Adjuvant treatment was completed by June 2024 and CT imaging done subsequently showed complete response.  She remained on maintenance immunotherapy without complications  Imaging study from October 2025 showed no evidence of recurrence Treatment course is complicated by excessive weight gain, multifactorial with component contributed by severe hypothyroidism The plan will be to continue maintenance immunotherapy until March 2026 I plan to repeat imaging study again in April 2026

## 2023-10-19 NOTE — Progress Notes (Signed)
 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  Due to allergic reaction, paclitaxel  will discontinue and substituted with docetaxel .  Adjuvant treatment was completed by June 2024 and CT imaging done subsequently showed complete response.  She remained on maintenance immunotherapy without complications  Imaging study from October 2025 showed no evidence of recurrence Treatment course is complicated by excessive weight gain, multifactorial with component contributed by severe hypothyroidism The plan will be to continue maintenance immunotherapy until March 2026 I plan to repeat imaging study again in April 2026 Acute leg pain, right She had recent travel to [place] , car ride and complaining of acute pain in the medial aspect of her right knee near the ligament area Her examination is benign Due to risk of blood clot, I will order urgent venous Doppler ultrasound today Obesity, Class III, BMI 40-49.9 (morbid obesity) (HCC) She has significant progressive weight gain She will continue on dietary modification and increase physical activity as tolerated  Orders Placed This Encounter  Procedures   CBC with Differential ([facility] Only)    Standing Status:   Future    Expected Date:   01/11/2024    Expiration Date:   01/10/2025   CMP ([facility] only)    Standing Status:   Future    Expected Date:   01/11/2024    Expiration Date:   01/10/2025   T4    Standing Status:   Future    Expected Date:   01/11/2024    Expiration Date:   01/10/2025   TSH     Standing Status:   Future    Expected Date:   01/11/2024    Expiration Date:   01/10/2025     Almarie Bedford, MD  INTERVAL HISTORY: she returns for treatment follow-up Complications related to previous cycle of chemotherapy included right leg pain which is unrelated to treatment She was traveling to [place]  in September for wedding, approximately 8-hour car ride She complained of pain in the medial aspect of her right knee area and the ligament section Denies leg swelling I reviewed imaging studies with the patient and discussed plan of care  PHYSICAL EXAMINATION: ECOG PERFORMANCE STATUS: 1 - Symptomatic but completely ambulatory  No results found for: CAN125    Latest Ref Rng & Units 10/19/2023   10:10 AM 09/07/2023    9:27 AM 07/26/2023   12:21 PM  CBC  WBC 4.0 - 10.5 K/uL 9.2  7.0  8.8   Hemoglobin 12.0 - 15.0 g/dL 87.0  86.7  86.8   Hematocrit 36.0 - 46.0 % 39.7  40.0  39.8   Platelets 150 - 400 K/uL 271  309  303       Chemistry      Component Value Date/Time   NA 139 09/07/2023 0927   NA 139 02/08/2023 1311   K 3.8 09/07/2023 0927   CL 105 09/07/2023 0927   CO2 29 09/07/2023 0927   BUN 10 09/07/2023 0927   BUN 11 02/08/2023 1311   CREATININE 0.61 09/07/2023 0927      Component Value Date/Time   CALCIUM  9.1 09/07/2023 0927   ALKPHOS  56 09/07/2023 0927   AST 27 09/07/2023 0927   ALT 38 09/07/2023 0927   BILITOT 0.3 09/07/2023 0927       Vitals:   10/19/23 1034  BP: (!) 136/90  Pulse: 90  Resp: 18  Temp: 99.5 F (37.5 C)  SpO2: 96%   Filed Weights   10/19/23 1034  Weight: 243 lb 12.8 oz (110.6 kg)   Other relevant data reviewed during this visit included CBC, CMP, CT imaging from October 2025

## 2023-10-19 NOTE — Telephone Encounter (Signed)
 Called and given below message regarding synthroid  medication. She verbalized understanding and will take a total on 250 mcg synthroid  daily. Rs sent to her preferred pharmacy.  Told her per Dr. Lonn, doppler today is negative. It may be a possible muscle strain. Apply heat and tylenol . If the pain continue in her leg/ knee to contact PCP for evaluation and possible referral to ortho. She verbalized understanding.

## 2023-10-19 NOTE — Telephone Encounter (Signed)
-----   Message from Almarie Bedford sent at 10/19/2023  1:45 PM EDT ----- [facility] is still high Call in 50 mcg synthroid  to be added to her current 200 mcg dose Call in 60 tabs, no refills

## 2023-10-19 NOTE — Assessment & Plan Note (Addendum)
 She has significant progressive weight gain She will continue on dietary modification and increase physical activity as tolerated

## 2023-10-19 NOTE — Progress Notes (Signed)
 Right lower extremity venous duplex has been completed.  Results can be found in chart review under CV Proc.  10/19/2023 4:31 PM  Edilia Elden Appl, RVT.

## 2023-10-20 ENCOUNTER — Encounter: Payer: Self-pay | Admitting: Hematology and Oncology

## 2023-10-20 ENCOUNTER — Other Ambulatory Visit: Payer: Self-pay

## 2023-10-20 ENCOUNTER — Telehealth: Payer: Self-pay

## 2023-10-20 DIAGNOSIS — E119 Type 2 diabetes mellitus without complications: Secondary | ICD-10-CM

## 2023-10-20 LAB — T4: T4, Total: 6.3 ug/dL (ref 4.5–12.0)

## 2023-10-20 MED ORDER — TRULICITY 4.5 MG/0.5ML ~~LOC~~ SOAJ
4.5000 mg | SUBCUTANEOUS | 11 refills | Status: DC
  Filled 2023-10-20: qty 2, 28d supply, fill #0
  Filled 2023-11-17: qty 2, 28d supply, fill #1

## 2023-10-20 NOTE — Telephone Encounter (Signed)
 Contacted patient for scheduled telephone appointment today. She requests to reschedule as she is in the middle of something. Confirmed that she did not have any urgent medication needs. She reports she needs refills of Trulicity . Will collaborate with PCP to place order today. Will follow-up in 4-6 weeks. Encouraged patient to monitor home BP prior to next telephone appt.   Lorain Baseman, PharmD [facility] [phone]

## 2023-10-20 NOTE — Progress Notes (Deleted)
 10/20/2023 Name: Maureen Campos MRN: [id number] DOB: 23-Apr-1970  No chief complaint on file.   Maureen Campos is a 53 y.o. year old female who presented for a telephone visit.   They were referred to the pharmacist by their PCP for assistance in managing diabetes. 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 At that time, patient reported tolerating Trulicity  well. Most recent A1C was controlled at 6.5%. She reported that she was not taking atorvastatin  consistently due to concern about taking so many pills. She was counseled on the importance of adherence. No medication changes were made. Patient was seen by PCP Lorice Shall, NP on 06/28/23. BP was elevated to 149/85 mmHg, though she was not taking valsartan  consistently at that time. At pharmacy telephone call on 09/10/23, patient reported tolerating DM medications well, had not checked BG recently. She had recently had a panic attack with chest tightness and ShOB. Recent clinic BP had been elevated so she was instructed to monitor at home. At PCP appt on 09/28/23, A1C remained controlled at 6.6%. BP was close to goal at 123/84 mmHg. She was started on ezetimibe  10 mg daily for LDL-C increased to 102 mg/dL. She saw oncology on 10/20/23. Levothyroxine  was further increased to 250 mcg daily.   Today, ***  She reports she is doing ok. She has an upper respiratory illness that has been going on for the past two days. She also had to call 911 over the weekend for chest tightness and ShOB - but determined that this was a panic attack possibly induced by being overactive, having a lot going (cooked a lot for her husband's birthday that day). Also feels that her having more weight gain recently could have contributed. She has not been checking her BG and BP recently.   Care Team: Primary Care Provider: [name],  [name], FNP ; Next Scheduled Visit: 09/28/23 Oncologists: Dr. Lonn  Medication Access/Adherence  Current Pharmacy:  [facility] [address], [address] [place] [address] Phone: [phone] Fax: [phone]   Patient reports affordability concerns with their medications: Yes  - getting Trulicity  via [facility] supply Patient reports access/transportation concerns to their pharmacy: No  Patient reports adherence concerns with their medications:  No   - reports that she does forget her valsartan  dose sometimes. Usually takes pills in afternoon around 3-4 PM. Denies missed doses of Trulicity .    Diabetes:  Current medications: metformin  1000 mg PO BID, Trulicity  4.5 mg subcutaneous once weekly (Tuesdays) Medications tried in the past: none  Tolerating Trulicity  well - no diarrhea, nausea, or vomiting.   Patient denies hypoglycemic s/sx including shakiness, sweating. Patient denies hyperglycemic symptoms including polyuria, polydipsia, polyphagia, nocturia, neuropathy.   Patient is not checking BG regularly.  Current physical activity: She has started going to the gym with her daughter, walking on the treadmill three times a week, gradually increasing her duration from 15 to 30 minutes, with a goal of reaching an hour.   Current meal patterns: Eats 2 meals/day, but does snack during the day - Breakfast: boiled egg - Lunch: eggs, beans, cheese, guacamole, small tortillas - Supper: beans, meat, onion  - Snacks: vegetables, fruits - Drinks: drinking more wate  Hyperlipidemia/ASCVD Risk Reduction  Current lipid lowering medications: atorvastatin  80 mg PO daily  Family History: no known cardiac hx Risk Factors: DM, HTN  Hypertension:  Current medications: valsartan  80 mg daily (reports she does not take this every day) Medications previously tried: none  Patient has a validated, automated, upper arm home BP  cuff Current blood pressure readings readings: has not checked recently, but recent clinic readings have been more elevated  Patient reports hypotensive s/sx including dizziness - usually in the morning before eating. Resolves after eating.  Patient denies hypertensive symptoms including headache, chest pain, shortness of breath except in the setting of her recent panic attack.   Current medication assistance:  - Eliquis  BMS approved 06/04/22 to 06/03/23   Clinical ASCVD: No  The 10-year ASCVD risk score (Arnett DK, et al., 2019) is: 5.8%   Values used to calculate the score:     Age: 31 years     Clincally relevant sex: Female     Is Non-Hispanic African American: No     Diabetic: Yes     Tobacco smoker: No     Systolic Blood Pressure: 138 mmHg     Is BP treated: Yes     HDL Cholesterol: 36 mg/dL     Total Cholesterol: 178 mg/dL    Objective:  BP Readings from Last 3 Encounters:  10/19/23 138/75  10/19/23 (!) 136/90  09/28/23 123/84   UACR 06/15/22: 59 mg/g  Lab Results  Component Value Date   HGBA1C 6.6 (A) 09/28/2023    Lab Results  Component Value Date   CREATININE 0.49 10/19/2023   BUN 17 10/19/2023   NA 139 10/19/2023   K 4.0 10/19/2023   CL 105 10/19/2023   CO2 29 10/19/2023    Lab Results  Component Value Date   CHOL 178 09/28/2023   HDL 36 (L) 09/28/2023   LDLCALC 102 (H) 09/28/2023   LDLDIRECT 75 01/05/2023   TRIG 231 (H) 09/28/2023   CHOLHDL 4.9 (H) 09/28/2023    Medications Reviewed Today   Medications were not reviewed in this encounter     Assessment/Plan:   Diabetes: - Currently controlled with last A1C 6.5% (down from 7.8%) on 05/03/23. Patient continues to tolerate Trulicity  well, though no SMBG data available today to confirm continued control.  - Last UACR 06/28/23: 22 mg/g (previously 59 mg/g) - Reviewed long term cardiovascular and renal outcomes of uncontrolled blood sugar - Reviewed goal A1c, goal fasting, and goal 2 hour post  prandial glucose - Recommend to continue Trulicity  4.5 mg weekly - Recommend to continue metformin  1000 mg BID  - Patient denies personal or family history of multiple endocrine neoplasia type 2, medullary thyroid  cancer; personal history of pancreatitis or gallbladder disease. - Recommend to check fasting blood glucose a few times a week and as needed - UACR due at upcoming PCP visit  Hypertension: - Currently uncontrolled with recent clinic readings consistently above goal < 130/80 mmHg. However, patient endorses that she does occasionally miss doses of her medication.  - Reviewed long term cardiovascular and renal outcomes of uncontrolled blood pressure - Reviewed appropriate blood pressure monitoring technique and reviewed goal blood pressure. Recommended to check home blood pressure and heart rate periodically. - Recommend to continue valsartan  80 mg PO daily. Consider increasing to 160 mg daily if next clinic BP is uncontrolled and Scr, K are WNL/stable.  - Counseled on proper home monitoring technique  Hyperlipidemia/ASCVD Risk Reduction: - Currently uncontrolled with LDL-C of 77 mg/dL above goal < 70 mg/dL given concomitant metabolic syndrome, but improved from 131 mg/dL. TG also improved  to 195 mg/gL from 258 mg/dL. Emphasized adherence to  statin, but could consider escalating to 80 mg daily in the future. - Recommend to continue atorvastatin  40 mg PO daily   Hypothyroidism:  Last TSH on 09/07/23 elevated to 26 uIU/mL, in the setting of continued weight gain. This was checked by oncology, who has increased her dose up to 200 mcg daily (since 08/03/23). Patient did report that she takes levothyroxine  before breakfast without any other food or medications, but did not specifically state how frequently she misses doses. Fill hx indicates adherence.  Follow Up Plan: PCP 12/28/23 and pharmacist telephone ***  Lorain Baseman, PharmD [facility] [phone]

## 2023-10-22 ENCOUNTER — Other Ambulatory Visit: Payer: Self-pay

## 2023-10-26 ENCOUNTER — Other Ambulatory Visit: Payer: Self-pay

## 2023-10-27 ENCOUNTER — Encounter: Payer: Self-pay | Admitting: Hematology and Oncology

## 2023-11-17 ENCOUNTER — Other Ambulatory Visit: Payer: Self-pay

## 2023-11-17 ENCOUNTER — Encounter: Payer: Self-pay | Admitting: Hematology and Oncology

## 2023-11-26 ENCOUNTER — Other Ambulatory Visit: Payer: Self-pay

## 2023-11-29 NOTE — Telephone Encounter (Signed)
 Patient called, requesting to speak to the pharmacist. Please call back the patient when available.

## 2023-11-30 ENCOUNTER — Other Ambulatory Visit: Payer: Self-pay

## 2023-11-30 ENCOUNTER — Encounter: Payer: Self-pay | Admitting: Hematology and Oncology

## 2023-11-30 ENCOUNTER — Telehealth: Payer: Self-pay

## 2023-11-30 ENCOUNTER — Inpatient Hospital Stay (HOSPITAL_BASED_OUTPATIENT_CLINIC_OR_DEPARTMENT_OTHER): Admitting: Hematology and Oncology

## 2023-11-30 ENCOUNTER — Inpatient Hospital Stay: Attending: Psychiatry

## 2023-11-30 ENCOUNTER — Inpatient Hospital Stay

## 2023-11-30 VITALS — BP 126/81 | HR 91 | Temp 98.0°F | Resp 20 | Ht 60.0 in | Wt 243.0 lb

## 2023-11-30 DIAGNOSIS — I10 Essential (primary) hypertension: Secondary | ICD-10-CM

## 2023-11-30 DIAGNOSIS — E039 Hypothyroidism, unspecified: Secondary | ICD-10-CM

## 2023-11-30 DIAGNOSIS — Z5112 Encounter for antineoplastic immunotherapy: Secondary | ICD-10-CM | POA: Insufficient documentation

## 2023-11-30 DIAGNOSIS — E119 Type 2 diabetes mellitus without complications: Secondary | ICD-10-CM | POA: Insufficient documentation

## 2023-11-30 DIAGNOSIS — C541 Malignant neoplasm of endometrium: Secondary | ICD-10-CM

## 2023-11-30 DIAGNOSIS — E785 Hyperlipidemia, unspecified: Secondary | ICD-10-CM

## 2023-11-30 LAB — CBC WITH DIFFERENTIAL (CANCER CENTER ONLY)
Abs Immature Granulocytes: 0.02 K/uL (ref 0.00–0.07)
Basophils Absolute: 0.1 K/uL (ref 0.0–0.1)
Basophils Relative: 1 %
Eosinophils Absolute: 0.1 K/uL (ref 0.0–0.5)
Eosinophils Relative: 1 %
HCT: 41.1 % (ref 36.0–46.0)
Hemoglobin: 13.5 g/dL (ref 12.0–15.0)
Immature Granulocytes: 0 %
Lymphocytes Relative: 27 %
Lymphs Abs: 2.5 K/uL (ref 0.7–4.0)
MCH: 28.8 pg (ref 26.0–34.0)
MCHC: 32.8 g/dL (ref 30.0–36.0)
MCV: 87.6 fL (ref 80.0–100.0)
Monocytes Absolute: 0.5 K/uL (ref 0.1–1.0)
Monocytes Relative: 5 %
Neutro Abs: 5.9 K/uL (ref 1.7–7.7)
Neutrophils Relative %: 66 %
Platelet Count: 300 K/uL (ref 150–400)
RBC: 4.69 MIL/uL (ref 3.87–5.11)
RDW: 14.9 % (ref 11.5–15.5)
WBC Count: 9.1 K/uL (ref 4.0–10.5)
nRBC: 0 % (ref 0.0–0.2)

## 2023-11-30 LAB — CMP (CANCER CENTER ONLY)
ALT: 21 U/L (ref 0–44)
AST: 21 U/L (ref 15–41)
Albumin: 4.2 g/dL (ref 3.5–5.0)
Alkaline Phosphatase: 69 U/L (ref 38–126)
Anion gap: 9 (ref 5–15)
BUN: 14 mg/dL (ref 6–20)
CO2: 28 mmol/L (ref 22–32)
Calcium: 9.5 mg/dL (ref 8.9–10.3)
Chloride: 103 mmol/L (ref 98–111)
Creatinine: 0.63 mg/dL (ref 0.44–1.00)
GFR, Estimated: >60 mL/min (ref >60)
Glucose, Bld: 142 mg/dL — ABNORMAL HIGH (ref 70–99)
Potassium: 3.9 mmol/L (ref 3.5–5.1)
Sodium: 140 mmol/L (ref 135–145)
Total Bilirubin: 0.3 mg/dL (ref 0.0–1.2)
Total Protein: 7.9 g/dL (ref 6.5–8.1)

## 2023-11-30 LAB — TSH: TSH: 69.2 u[IU]/mL — ABNORMAL HIGH (ref 0.350–4.500)

## 2023-11-30 MED ORDER — SODIUM CHLORIDE 0.9 % IV SOLN
1000.0000 mg | Freq: Once | INTRAVENOUS | Status: AC
  Administered 2023-11-30: 1000 mg via INTRAVENOUS
  Filled 2023-11-30: qty 20

## 2023-11-30 MED ORDER — ATORVASTATIN CALCIUM 80 MG PO TABS
80.0000 mg | ORAL_TABLET | Freq: Every day | ORAL | 3 refills | Status: AC
  Filled 2023-11-30: qty 90, 90d supply, fill #0

## 2023-11-30 MED ORDER — LEVOTHYROXINE SODIUM 50 MCG PO TABS
50.0000 ug | ORAL_TABLET | Freq: Every day | ORAL | 0 refills | Status: DC
  Filled 2023-11-30: qty 60, 60d supply, fill #0

## 2023-11-30 MED ORDER — LEVOTHYROXINE SODIUM 200 MCG PO TABS
200.0000 ug | ORAL_TABLET | Freq: Every day | ORAL | 0 refills | Status: AC
  Filled 2023-11-30: qty 60, 60d supply, fill #0

## 2023-11-30 MED ORDER — EZETIMIBE 10 MG PO TABS
10.0000 mg | ORAL_TABLET | Freq: Every day | ORAL | 3 refills | Status: AC
  Filled 2023-11-30: qty 90, 90d supply, fill #0

## 2023-11-30 MED ORDER — LIDOCAINE-PRILOCAINE 2.5-2.5 % EX CREA
TOPICAL_CREAM | CUTANEOUS | 3 refills | Status: AC
  Filled 2023-11-30: qty 30, 30d supply, fill #0

## 2023-11-30 MED ORDER — VALSARTAN 80 MG PO TABS
80.0000 mg | ORAL_TABLET | Freq: Every day | ORAL | 1 refills | Status: AC
  Filled 2023-11-30: qty 90, 90d supply, fill #0

## 2023-11-30 MED ORDER — TRULICITY 4.5 MG/0.5ML ~~LOC~~ SOAJ
4.5000 mg | SUBCUTANEOUS | 11 refills | Status: AC
  Filled 2023-11-30 – 2023-12-08 (×3): qty 2, 28d supply, fill #0
  Filled 2024-01-10 (×2): qty 2, 28d supply, fill #1
  Filled 2024-02-08: qty 2, 28d supply, fill #2

## 2023-11-30 MED ORDER — METFORMIN HCL 1000 MG PO TABS
1000.0000 mg | ORAL_TABLET | Freq: Two times a day (BID) | ORAL | 3 refills | Status: AC
Start: 2023-11-30 — End: ?
  Filled 2023-11-30: qty 180, 90d supply, fill #0

## 2023-11-30 MED ORDER — SODIUM CHLORIDE 0.9 % IV SOLN: Freq: Once | INTRAVENOUS | Status: AC

## 2023-11-30 NOTE — Assessment & Plan Note (Addendum)
 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  Due to allergic reaction, paclitaxel  will discontinue and substituted with docetaxel .  Adjuvant treatment was completed by June 2024 and CT imaging done subsequently showed complete response.  She remained on maintenance immunotherapy without complications  Imaging study from October 2025 showed no evidence of recurrence Treatment course is complicated by excessive weight gain, multifactorial with component contributed by severe hypothyroidism The plan will be to continue maintenance immunotherapy until Feb 2026 I plan to repeat imaging study again in March 2026

## 2023-11-30 NOTE — Assessment & Plan Note (Addendum)
 She lost all her medications from recent travel I refilled all her prescriptions as requested

## 2023-11-30 NOTE — Telephone Encounter (Addendum)
 Patient called reporting that she was out of [place] for a week for a wedding. She accidentally left all her medications (including her whole box of Trulicity ) in her hotel room. She called the hotel who stated that they threw it out. She is needing early refills of all her medications. Do not anticipate having an issue with refilling her medications early since she pays CASH or gets meds via [facility] supply. Will communicate with [facility] pharmacy to refill.   Noted that she does need refills for Synthroid  which is prescribed by Dr. Lonn. Will send a message to prescribing physician and ask pharmacy to send a refill request.   Lorain Baseman, PharmD [facility] [phone]

## 2023-11-30 NOTE — Telephone Encounter (Signed)
 Pt called to peak to you. Have a great day.   Suzen Shove   CMA II

## 2023-11-30 NOTE — Telephone Encounter (Signed)
 Attempted to call after receiving a message from nurse today that she needs assistance with grant. Unable to leave and received a message call unable to be completed.

## 2023-11-30 NOTE — Patient Instructions (Signed)
 [facility] A DEPT OF [facility]  Discharge Instructions: Thank you for choosing [facility] to provide your oncology and hematology care.   If you have a lab appointment with the Cancer Center, please go directly to the Cancer Center and check in at the registration area.   Wear comfortable clothing and clothing appropriate for easy access to any Portacath or PICC line.   We strive to give you quality time with your provider. You may need to reschedule your appointment if you arrive late (15 or more minutes).  Arriving late affects you and other patients whose appointments are after yours.  Also, if you miss three or more appointments without notifying the office, you may be dismissed from the clinic at the provider's discretion.      For prescription refill requests, have your pharmacy contact our office and allow 72 hours for refills to be completed.    Today you received the following chemotherapy and/or immunotherapy agents: Jemperli      To help prevent nausea and vomiting after your treatment, we encourage you to take your nausea medication as directed.  BELOW ARE SYMPTOMS THAT SHOULD BE REPORTED IMMEDIATELY: *FEVER GREATER THAN 100.4 F (38 C) OR HIGHER *CHILLS OR SWEATING *NAUSEA AND VOMITING THAT IS NOT CONTROLLED WITH YOUR NAUSEA MEDICATION *UNUSUAL SHORTNESS OF BREATH *UNUSUAL BRUISING OR BLEEDING *URINARY PROBLEMS (pain or burning when urinating, or frequent urination) *BOWEL PROBLEMS (unusual diarrhea, constipation, pain near the anus) TENDERNESS IN MOUTH AND THROAT WITH OR WITHOUT PRESENCE OF ULCERS (sore throat, sores in mouth, or a toothache) UNUSUAL RASH, SWELLING OR PAIN  UNUSUAL VAGINAL DISCHARGE OR ITCHING   Items with * indicate a potential emergency and should be followed up as soon as possible or go to the Emergency Department if any problems should occur.  Please show the CHEMOTHERAPY ALERT CARD or IMMUNOTHERAPY  ALERT CARD at check-in to the Emergency Department and triage nurse.  Should you have questions after your visit or need to cancel or reschedule your appointment, please contact [facility]  Dept: [phone]  and follow the prompts.  Office hours are 8:00 a.m. to 4:30 p.m. Monday - Friday. Please note that voicemails left after 4:00 p.m. may not be returned until the following business day.  We are closed weekends and major holidays. You have access to a nurse at all times for urgent questions. Please call the main number to the clinic Dept: [phone] and follow the prompts.   For any non-urgent questions, you may also contact your provider using MyChart. We now offer e-Visits for anyone 35 and older to request care online for non-urgent symptoms. For details visit [email or web address].   Also download the MyChart app! Go to the app store, search "MyChart", open the app, select Normal, and log in with your MyChart username and password.

## 2023-11-30 NOTE — Assessment & Plan Note (Addendum)
 She has persistent elevated TSH despite multiple dose adjustments We will continue to adjust the dose of her treatment as needed

## 2023-11-30 NOTE — Progress Notes (Signed)
 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  Due to allergic reaction, paclitaxel  will discontinue and substituted with docetaxel .  Adjuvant treatment was completed by June 2024 and CT imaging done subsequently showed complete response.  She remained on maintenance immunotherapy without complications  Imaging study from October 2025 showed no evidence of recurrence Treatment course is complicated by excessive weight gain, multifactorial with component contributed by severe hypothyroidism The plan will be to continue maintenance immunotherapy until Feb 2026 I plan to repeat imaging study again in March 2026 Type 2 diabetes mellitus without complication, without long-term current use of insulin  (HCC) She lost all her medications from recent travel I refilled all her prescriptions as requested Acquired hypothyroidism She has persistent elevated TSH despite multiple dose adjustments We will continue to adjust the dose of her treatment as needed  Orders Placed This Encounter  Procedures   CBC with Differential (Cancer Center Only)    Standing Status:   Future    Expected Date:   02/22/2024    Expiration Date:   02/21/2025   CMP (Cancer Center only)    Standing Status:   Future    Expected Date:   02/22/2024    Expiration Date:   02/21/2025   T4    Standing Status:   Future    Expected Date:   02/22/2024    Expiration Date:   02/21/2025   TSH    Standing Status:   Future    Expected Date:   02/22/2024    Expiration Date:   02/21/2025     Almarie Bedford, MD  INTERVAL HISTORY: she returns for treatment follow-up Complications related to previous cycle of chemotherapy included abnormal thyroid  function She just returned from a trip recently She lost all her medications because she left them at the hotel She is out of medication as of November 22, PHYSICAL EXAMINATION: ECOG PERFORMANCE STATUS: 0 - Asymptomatic  No results found for: CAN125    Latest Ref Rng & Units 11/30/2023   10:04 AM 10/19/2023   10:10 AM 09/07/2023    9:27 AM  CBC  WBC 4.0 - 10.5 K/uL 9.1  9.2  7.0   Hemoglobin 12.0 - 15.0 g/dL 86.4  87.0  86.7   Hematocrit 36.0 - 46.0 % 41.1  39.7  40.0   Platelets 150 - 400 K/uL 300  271  309       Chemistry      Component Value Date/Time   NA 139 10/19/2023 1010   NA 139 02/08/2023 1311   K 4.0 10/19/2023 1010   CL 105 10/19/2023 1010   CO2 29 10/19/2023 1010   BUN 17 10/19/2023 1010   BUN 11 02/08/2023 1311   CREATININE 0.49 10/19/2023 1010      Component Value Date/Time   CALCIUM  9.8 10/19/2023 1010   ALKPHOS 63 10/19/2023 1010   AST 17 10/19/2023 1010   ALT 18 10/19/2023 1010   BILITOT 0.3 10/19/2023 1010       Vitals:   11/30/23 1031  BP: 126/81  Pulse: 91  Resp: 20  Temp: 98 F (36.7 C)  SpO2: 91%   Filed Weights   11/30/23 1031  Weight: 243 lb (110.2 kg)   Other relevant data reviewed during this visit included CBC, CMP

## 2023-12-01 ENCOUNTER — Other Ambulatory Visit: Payer: Self-pay

## 2023-12-01 LAB — T4: T4, Total: 5.3 ug/dL (ref 4.5–12.0)

## 2023-12-03 NOTE — Telephone Encounter (Signed)
 Called Maure[facility]pos to if she had medications. She has all medications and will call the office back if needed.

## 2023-12-03 NOTE — Telephone Encounter (Signed)
 Hi Erminio,  I do not understand why the pharmacist sent this I clicked on every medication and refilled every one of them

## 2023-12-06 ENCOUNTER — Inpatient Hospital Stay: Attending: Psychiatry | Admitting: Licensed Clinical Social Worker

## 2023-12-06 NOTE — Progress Notes (Signed)
 [facility] Clinical Social Work  Clinical Social Work was referred by engineer, civil (consulting) for financial concerns.  Clinical Social Worker contacted patient by phone to offer support and assess for needs.   Pt concerned about medical bills and wondering about payment plans or potential assistance.  Pt is not working because of health concerns. She does not qualify for disability or Medicaid.   CSW advised pt to speak with billing department directly regarding payment plans. Also informed pt to apply for [facility] financial assistance and mailed application to pt today.      Follow Up Plan:  Patient will contact CSW with any support or resource needs    Arneisha Kincannon E Aundreya Souffrant, LCSW  Clinical Social Worker [facility]

## 2023-12-08 ENCOUNTER — Other Ambulatory Visit: Payer: Self-pay

## 2023-12-08 ENCOUNTER — Encounter: Payer: Self-pay | Admitting: Hematology and Oncology

## 2023-12-08 ENCOUNTER — Other Ambulatory Visit (HOSPITAL_COMMUNITY): Payer: Self-pay

## 2023-12-08 DIAGNOSIS — E119 Type 2 diabetes mellitus without complications: Secondary | ICD-10-CM

## 2023-12-08 NOTE — Progress Notes (Signed)
 12/08/2023 Name: Maureen Campos MRN: [id number] DOB: [birth year]  Chief Complaint  Patient presents with   Diabetes   Hypertension    Maureen Campos is a 53 y.o. year old female who presented for a telephone visit.   They were referred to the pharmacist by their PCP for assistance in managing diabetes. 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 A1C was stable at 6.6%. BP was close to goal at 123/84 mmHg. Patient contacted me on 11/30/23 reporting losing her medications in a hotel room. Assisted her in obtaining refills.   Today, patient reports doing well. She still did not get Trulicity . Has only missed one dose, which was due yesterday. Otherwise, reports doing well. Not checking her BG or BP very often.  Care Team: Primary Care Provider: [name], [name], FNP ; Next Scheduled Visit: 12/28/23 Oncologists: Dr. Lonn  Medication Access/Adherence  Current Pharmacy:  [facility] [address], [address] [place] [address] Phone: [phone] Fax: [phone]   Patient reports affordability concerns with their medications: Yes  - getting Trulicity  via [facility] supply Patient reports access/transportation concerns to their pharmacy: No  Patient reports adherence concerns with their medications:  No   - Usually takes pills in afternoon around 3-4 PM.    Diabetes:  Current medications: metformin  1000 mg PO BID, Trulicity  4.5 mg subcutaneous once weekly (Tuesdays) Medications tried in the past: none  Tolerating Trulicity  well - no diarrheaor vomiting. Reports she did have some nausea in the morning the other week, which she thinks may be due to not eating enough (stomach was too empty).   Patient denies hypoglycemic  s/sx including shakiness, sweating. Patient denies hyperglycemic symptoms including polyuria, polydipsia, polyphagia, nocturia, neuropathy.   Patient is not checking BG regularly. Saturday it was 140 mg/dL post-prandial.  Current meal patterns: Eats 2 meals/day, but does snack during the day. Reports she has been in a habit of eating at night. - Breakfast: boiled egg - Lunch: eggs, beans, cheese, guacamole, small tortillas - Supper: beans, meat, onion  - Snacks: vegetables, fruits - Drinks: drinking more water , has been having more orange juice recently  Hyperlipidemia/ASCVD Risk Reduction  Current lipid lowering medications: atorvastatin  80 mg PO daily, ezetimibe  10 mg daily  Family History: no known cardiac hx Risk Factors: DM, HTN  Hypertension:  Current medications: valsartan  80 mg daily  Medications previously tried: none  Patient has a validated, automated, upper arm home BP cuff Current blood pressure readings readings: has not checked recently, but recent clinic readings have been close to goal.  Patient denies hypotensive s/sx including dizziness. Patient denies hypertensive symptoms including chest pain, shortness of breath, headache.   Clinical ASCVD: No  The 10-year ASCVD risk score (Arnett DK, et al., 2019) is: 5.2%   Values used to calculate the score:     Age: 85 years     Clincally relevant sex: Female     Is Non-Hispanic African American: No     Diabetic: Yes     Tobacco smoker: No     Systolic Blood Pressure: 126 mmHg     Is BP treated: Yes     HDL Cholesterol: 36 mg/dL     Total Cholesterol: 178 mg/dL    Objective:  BP Readings from Last 3 Encounters:  11/30/23 126/81  10/19/23 138/75  10/19/23 (!) 136/90   UACR 06/15/22: 59 mg/g  Lab Results  Component Value Date   HGBA1C 6.6 (A) 09/28/2023    Lab Results  Component Value Date   CREATININE 0.63 11/30/2023   BUN 14 11/30/2023   NA 140 11/30/2023   K 3.9 11/30/2023   CL 103 11/30/2023    CO2 28 11/30/2023    Lab Results  Component Value Date   CHOL 178 09/28/2023   HDL 36 (L) 09/28/2023   LDLCALC 102 (H) 09/28/2023   LDLDIRECT 75 01/05/2023   TRIG 231 (H) 09/28/2023   CHOLHDL 4.9 (H) 09/28/2023    Medications Reviewed Today     Reviewed by Brinda Lorain SQUIBB, RPH-CPP (Pharmacist) on 12/08/23 at 1555  Med List Status: <None>   Medication Order Taking? Sig Documenting Provider Last Dose Status Informant  atorvastatin  (LIPITOR) 80 MG tablet [id number] Yes Take 1 tablet (80 mg total) by mouth daily. Lonn Hicks, MD  Active   Blood Glucose Monitoring Suppl DEVI [id number]  1 each by Does not apply route in the morning, at noon, and at bedtime. May substitute to any manufacturer covered by patient's insurance. [name], [name], FNP  Active   Dulaglutide  (TRULICITY ) 4.5 MG/0.5ML EMMANUEL [id number] Yes Inject 4.5 mg as directed once a week. Lonn Hicks, MD  Active   ezetimibe  (ZETIA ) 10 MG tablet [id number] Yes Take 1 tablet (10 mg total) by mouth daily. Lonn Hicks, MD  Active   levothyroxine  (SYNTHROID ) 200 MCG tablet [id number] Yes Take 1 tablet (200 mcg total) by mouth daily before breakfast. Lonn Hicks, MD  Active   levothyroxine  (SYNTHROID ) 50 MCG tablet [id number] Yes Take 1 tablet (50 mcg total) by mouth daily before breakfast. Take with current Synthroid  200 mcg dose, for a total of 250 mcg daily Lonn Hicks, MD  Active   lidocaine -prilocaine  (EMLA ) cream [id number]  Apply to affected area once Gorsuch, Ni, MD  Active   metFORMIN  (GLUCOPHAGE ) 1000 MG tablet [id number] Yes Take 1 tablet (1,000 mg total) by mouth 2 (two) times daily with a meal. Lonn, Ni, MD  Active   ondansetron  (ZOFRAN ) 8 MG tablet [id number]  Take 1 tablet (8 mg total) by mouth every 8 (eight) hours as needed for nausea or vomiting. Start on the third day after chemotherapy. Lonn Hicks, MD  Active   prochlorperazine  (COMPAZINE ) 10 MG tablet [id number]  Take 1 tablet (10 mg total) by mouth every 6 (six) hours as  needed for nausea or vomiting. Lonn Hicks, MD  Active   valsartan  (DIOVAN ) 80 MG tablet [id number] Yes Take 1 tablet (80 mg total) by mouth daily. Lonn Hicks, MD  Active             Assessment/Plan:   Diabetes: - Currently controlled with last A1C 6.6% in Sept 2025. Patient continues to tolerate Trulicity  well, though limited SMBG data available today to confirm continued control.  - Last UACR 06/28/23: 22 mg/g (previously 59 mg/g) - Reviewed long term cardiovascular and renal outcomes of uncontrolled blood sugar - Reviewed goal A1c, goal fasting, and goal 2 hour post prandial glucose - Recommend to continue Trulicity  4.5 mg weekly. Confirmed that [facility] pharmacy can fill for her (one time early fill given lost medication) - Recommend to continue metformin  1000 mg BID  - Patient denies personal or family history of multiple endocrine neoplasia type 2, medullary thyroid  cancer; personal history of pancreatitis or gallbladder disease. - Recommend to check fasting blood glucose a few times a  week and as needed   Hypertension: - Currently variably controlled with recent clinic readings consistently close to goal < 130/80 mmHg. Encouraged patient to continue monitoring at home. Could consider increase in dose of ARB in the future. - Reviewed long term cardiovascular and renal outcomes of uncontrolled blood pressure - Reviewed appropriate blood pressure monitoring technique and reviewed goal blood pressure. Recommended to check home blood pressure and heart rate periodically. - Recommend to continue valsartan  80 mg PO daily.    Hyperlipidemia/ASCVD Risk Reduction: - Currently uncontrolled with LDL-C of 102 mg/dL above goal < 70 mg/dL given concomitant metabolic syndrome, but improved from 131 mg/dL. TG also > 150 mg/dL. Since then, patient has been initiated on ezetimibe  10 mg daily.  - Recommend to continue atorvastatin  80 mg PO daily, ezetimibe  10 mg daily    Follow Up Plan: PCP 12/28/23  and pharmacist telephone 02/22/24  Lorain Baseman, PharmD [facility] [phone]

## 2023-12-09 ENCOUNTER — Other Ambulatory Visit: Payer: Self-pay

## 2023-12-13 ENCOUNTER — Other Ambulatory Visit: Payer: Self-pay

## 2023-12-17 ENCOUNTER — Other Ambulatory Visit: Payer: Self-pay

## 2023-12-28 ENCOUNTER — Ambulatory Visit: Payer: Self-pay | Admitting: Nurse Practitioner

## 2024-01-10 ENCOUNTER — Encounter: Payer: Self-pay | Admitting: Hematology and Oncology

## 2024-01-10 ENCOUNTER — Other Ambulatory Visit: Payer: Self-pay

## 2024-01-11 ENCOUNTER — Telehealth: Payer: Self-pay

## 2024-01-11 ENCOUNTER — Inpatient Hospital Stay: Attending: Psychiatry

## 2024-01-11 ENCOUNTER — Other Ambulatory Visit: Payer: Self-pay

## 2024-01-11 ENCOUNTER — Encounter: Payer: Self-pay | Admitting: Hematology and Oncology

## 2024-01-11 ENCOUNTER — Inpatient Hospital Stay

## 2024-01-11 ENCOUNTER — Inpatient Hospital Stay: Attending: Psychiatry | Admitting: Hematology and Oncology

## 2024-01-11 VITALS — BP 136/107 | HR 90 | Temp 99.0°F | Resp 18 | Ht 60.0 in | Wt 246.0 lb

## 2024-01-11 VITALS — BP 135/72 | HR 86 | Temp 98.2°F | Resp 20

## 2024-01-11 DIAGNOSIS — C541 Malignant neoplasm of endometrium: Secondary | ICD-10-CM

## 2024-01-11 DIAGNOSIS — Z5112 Encounter for antineoplastic immunotherapy: Secondary | ICD-10-CM | POA: Insufficient documentation

## 2024-01-11 DIAGNOSIS — E039 Hypothyroidism, unspecified: Secondary | ICD-10-CM

## 2024-01-11 DIAGNOSIS — E119 Type 2 diabetes mellitus without complications: Secondary | ICD-10-CM

## 2024-01-11 LAB — CMP (CANCER CENTER ONLY)
ALT: 38 U/L (ref 0–44)
AST: 36 U/L (ref 15–41)
Albumin: 4.4 g/dL (ref 3.5–5.0)
Alkaline Phosphatase: 70 U/L (ref 38–126)
Anion gap: 11 (ref 5–15)
BUN: 9 mg/dL (ref 6–20)
CO2: 27 mmol/L (ref 22–32)
Calcium: 9.5 mg/dL (ref 8.9–10.3)
Chloride: 98 mmol/L (ref 98–111)
Creatinine: 0.76 mg/dL (ref 0.44–1.00)
GFR, Estimated: >60 mL/min
Glucose, Bld: 146 mg/dL — ABNORMAL HIGH (ref 70–99)
Potassium: 3.7 mmol/L (ref 3.5–5.1)
Sodium: 136 mmol/L (ref 135–145)
Total Bilirubin: 0.3 mg/dL (ref 0.0–1.2)
Total Protein: 8.5 g/dL — ABNORMAL HIGH (ref 6.5–8.1)

## 2024-01-11 LAB — CBC WITH DIFFERENTIAL (CANCER CENTER ONLY)
Abs Immature Granulocytes: 0.02 K/uL (ref 0.00–0.07)
Basophils Absolute: 0.1 K/uL (ref 0.0–0.1)
Basophils Relative: 1 %
Eosinophils Absolute: 0.1 K/uL (ref 0.0–0.5)
Eosinophils Relative: 1 %
HCT: 41.9 % (ref 36.0–46.0)
Hemoglobin: 13.8 g/dL (ref 12.0–15.0)
Immature Granulocytes: 0 %
Lymphocytes Relative: 32 %
Lymphs Abs: 2.6 K/uL (ref 0.7–4.0)
MCH: 29.1 pg (ref 26.0–34.0)
MCHC: 32.9 g/dL (ref 30.0–36.0)
MCV: 88.2 fL (ref 80.0–100.0)
Monocytes Absolute: 0.4 K/uL (ref 0.1–1.0)
Monocytes Relative: 5 %
Neutro Abs: 4.8 K/uL (ref 1.7–7.7)
Neutrophils Relative %: 61 %
Platelet Count: 271 K/uL (ref 150–400)
RBC: 4.75 MIL/uL (ref 3.87–5.11)
RDW: 15.9 % — ABNORMAL HIGH (ref 11.5–15.5)
WBC Count: 8 K/uL (ref 4.0–10.5)
nRBC: 0 % (ref 0.0–0.2)

## 2024-01-11 LAB — TSH: TSH: 99.3 u[IU]/mL — ABNORMAL HIGH (ref 0.350–4.500)

## 2024-01-11 MED ORDER — SODIUM CHLORIDE 0.9 % IV SOLN: Freq: Once | INTRAVENOUS | Status: AC

## 2024-01-11 MED ORDER — SODIUM CHLORIDE 0.9 % IV SOLN
1000.0000 mg | Freq: Once | INTRAVENOUS | Status: AC
  Administered 2024-01-11: 1000 mg via INTRAVENOUS
  Filled 2024-01-11: qty 20

## 2024-01-11 MED ORDER — LEVOTHYROXINE SODIUM 100 MCG PO TABS
100.0000 ug | ORAL_TABLET | Freq: Every day | ORAL | 1 refills | Status: AC
  Filled 2024-01-11: qty 60, 60d supply, fill #0

## 2024-01-11 NOTE — Assessment & Plan Note (Addendum)
 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  Due to allergic reaction, paclitaxel  will discontinue and substituted with docetaxel .  Adjuvant treatment was completed by June 2024 and CT imaging done subsequently showed complete response.  She remained on maintenance immunotherapy without complications  Imaging study from October 2025 showed no evidence of recurrence Treatment course is complicated by excessive weight gain, multifactorial with component contributed by severe hypothyroidism The plan will be to continue maintenance immunotherapy until Feb 2026 I plan to repeat imaging study again in March 2026

## 2024-01-11 NOTE — Assessment & Plan Note (Addendum)
 She has persistent elevated TSH despite multiple dose adjustments We will continue to adjust the dose of her treatment as needed

## 2024-01-11 NOTE — Progress Notes (Signed)
 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  Due to allergic reaction, paclitaxel  will discontinue and substituted with docetaxel .  Adjuvant treatment was completed by June 2024 and CT imaging done subsequently showed complete response.  She remained on maintenance immunotherapy without complications  Imaging study from October 2025 showed no evidence of recurrence Treatment course is complicated by excessive weight gain, multifactorial with component contributed by severe hypothyroidism The plan will be to continue maintenance immunotherapy until Feb 2026 I plan to repeat imaging study again in March 2026 Type 2 diabetes mellitus without complication, without long-term current use of insulin  (HCC) She will continue to follow-up with her primary care doctor for medication adjustment Acquired hypothyroidism She has persistent elevated TSH despite multiple dose adjustments We will continue to adjust the dose of her treatment as needed  No orders of the defined types were placed in this encounter.    Almarie Bedford, MD  INTERVAL HISTORY: she returns for treatment follow-up Complications related to previous cycle of chemotherapy included abnormal thyroid  function and diabetes We discussed timing of her next imaging Otherwise, she denies side effects  PHYSICAL EXAMINATION: ECOG PERFORMANCE STATUS: 0 - Asymptomatic  No results found for: CAN125    Latest Ref Rng & Units 01/11/2024    9:21 AM 11/30/2023   10:04 AM 10/19/2023   10:10 AM   CBC  WBC 4.0 - 10.5 K/uL 8.0  9.1  9.2   Hemoglobin 12.0 - 15.0 g/dL 86.1  86.4  87.0   Hematocrit 36.0 - 46.0 % 41.9  41.1  39.7   Platelets 150 - 400 K/uL 271  300  271       Chemistry      Component Value Date/Time   NA 136 01/11/2024 0921   NA 139 02/08/2023 1311   K 3.7 01/11/2024 0921   CL 98 01/11/2024 0921   CO2 27 01/11/2024 0921   BUN 9 01/11/2024 0921   BUN 11 02/08/2023 1311   CREATININE 0.76 01/11/2024 0921      Component Value Date/Time   CALCIUM  9.5 01/11/2024 0921   ALKPHOS 70 01/11/2024 0921   AST 36 01/11/2024 0921   ALT 38 01/11/2024 0921   BILITOT 0.3 01/11/2024 0921       Vitals:   01/11/24 0941  BP: (!) 136/107  Pulse: 90  Resp: 18  Temp: 99 F (37.2 C)  SpO2: 98%   Filed Weights   01/11/24 0941  Weight: 246 lb (111.6 kg)   Other relevant data reviewed during this visit included CBC, CMP, TSH

## 2024-01-11 NOTE — Telephone Encounter (Signed)
 Called her and told her Dr. Lonn sent Synthroid  Rx for 100 mcg to her pharmacy and she needs to take with the Synthroid  200 mcg Rx for a total of 300 mcg total. She verbalized understanding.

## 2024-01-11 NOTE — Patient Instructions (Signed)
 [facility] - A DEPT OF [facility]  Discharge Instructions: Thank you for choosing [facility] to provide your oncology and hematology care.   If you have a lab appointment with the Cancer Center, please go directly to the Cancer Center and check in at the registration area.   Wear comfortable clothing and clothing appropriate for easy access to any Portacath or PICC line.   We strive to give you quality time with your provider. You may need to reschedule your appointment if you arrive late (15 or more minutes).  Arriving late affects you and other patients whose appointments are after yours.  Also, if you miss three or more appointments without notifying the office, you may be dismissed from the clinic at the providers discretion.      For prescription refill requests, have your pharmacy contact our office and allow 72 hours for refills to be completed.    Today you received the following chemotherapy and/or immunotherapy agents: Jemperli  (dostarlimab -gxly)   To help prevent nausea and vomiting after your treatment, we encourage you to take your nausea medication as directed.  BELOW ARE SYMPTOMS THAT SHOULD BE REPORTED IMMEDIATELY: *FEVER GREATER THAN 100.4 F (38 C) OR HIGHER *CHILLS OR SWEATING *NAUSEA AND VOMITING THAT IS NOT CONTROLLED WITH YOUR NAUSEA MEDICATION *UNUSUAL SHORTNESS OF BREATH *UNUSUAL BRUISING OR BLEEDING *URINARY PROBLEMS (pain or burning when urinating, or frequent urination) *BOWEL PROBLEMS (unusual diarrhea, constipation, pain near the anus) TENDERNESS IN MOUTH AND THROAT WITH OR WITHOUT PRESENCE OF ULCERS (sore throat, sores in mouth, or a toothache) UNUSUAL RASH, SWELLING OR PAIN  UNUSUAL VAGINAL DISCHARGE OR ITCHING   Items with * indicate a potential emergency and should be followed up as soon as possible or go to the Emergency Department if any problems should occur.  Please show the CHEMOTHERAPY ALERT CARD or  IMMUNOTHERAPY ALERT CARD at check-in to the Emergency Department and triage nurse.  Should you have questions after your visit or need to cancel or reschedule your appointment, please contact [facility] - A DEPT OF [facility]  Dept: [phone]  and follow the prompts.  Office hours are 8:00 a.m. to 4:30 p.m. Monday - Friday. Please note that voicemails left after 4:00 p.m. may not be returned until the following business day.  We are closed weekends and major holidays. You have access to a nurse at all times for urgent questions. Please call the main number to the clinic Dept: [phone] and follow the prompts.   For any non-urgent questions, you may also contact your provider using MyChart. We now offer e-Visits for anyone 46 and older to request care online for non-urgent symptoms. For details visit [email or web address].   Also download the MyChart app! Go to the app store, search MyChart, open the app, select [facility], and log in with your MyChart username and password.

## 2024-01-11 NOTE — Assessment & Plan Note (Addendum)
 She will continue to follow-up with her primary care doctor for medication adjustment

## 2024-01-12 ENCOUNTER — Other Ambulatory Visit: Payer: Self-pay

## 2024-01-12 LAB — T4: T4, Total: 0.7 ug/dL — ABNORMAL LOW (ref 4.5–12.0)

## 2024-01-18 ENCOUNTER — Other Ambulatory Visit: Payer: Self-pay

## 2024-01-19 ENCOUNTER — Encounter: Payer: Self-pay | Admitting: Nurse Practitioner

## 2024-01-19 ENCOUNTER — Ambulatory Visit: Payer: Self-pay | Admitting: Nurse Practitioner

## 2024-01-19 VITALS — BP 105/75 | HR 89 | Wt 251.0 lb

## 2024-01-19 DIAGNOSIS — R490 Dysphonia: Secondary | ICD-10-CM

## 2024-01-19 DIAGNOSIS — M67431 Ganglion, right wrist: Secondary | ICD-10-CM

## 2024-01-19 DIAGNOSIS — E010 Iodine-deficiency related diffuse (endemic) goiter: Secondary | ICD-10-CM

## 2024-01-19 DIAGNOSIS — E66813 Obesity, class 3: Secondary | ICD-10-CM

## 2024-01-19 DIAGNOSIS — Z6841 Body Mass Index (BMI) 40.0 and over, adult: Secondary | ICD-10-CM

## 2024-01-19 DIAGNOSIS — E039 Hypothyroidism, unspecified: Secondary | ICD-10-CM

## 2024-01-19 DIAGNOSIS — E785 Hyperlipidemia, unspecified: Secondary | ICD-10-CM

## 2024-01-19 DIAGNOSIS — F419 Anxiety disorder, unspecified: Secondary | ICD-10-CM

## 2024-01-19 DIAGNOSIS — I10 Essential (primary) hypertension: Secondary | ICD-10-CM

## 2024-01-19 DIAGNOSIS — E119 Type 2 diabetes mellitus without complications: Secondary | ICD-10-CM

## 2024-01-19 LAB — POCT GLYCOSYLATED HEMOGLOBIN (HGB A1C): Hemoglobin A1C: 6.9 % — AB (ref 4.0–5.6)

## 2024-01-19 NOTE — Assessment & Plan Note (Signed)
 Ganglion cyst, right wrist Ganglion cyst causing morning pain. No growth observed. Discussed potential orthopedic referral. - she chooses to monitor cyst for changes in size or pain. - Will consider referral to orthopedics if pain persists.

## 2024-01-19 NOTE — Assessment & Plan Note (Signed)
 Lab Results  Component Value Date   CHOL 178 09/28/2023   HDL 36 (L) 09/28/2023   LDLCALC 102 (H) 09/28/2023   LDLDIRECT 75 01/05/2023   TRIG 231 (H) 09/28/2023   CHOLHDL 4.9 (H) 09/28/2023     Managed with atorvastatin  and Zetia . - Ordered lipid panel. - Continue atorvastatin  80 mg oral daily. - Encouraged heart-healthy low-fat diet and regular exercise.

## 2024-01-19 NOTE — Assessment & Plan Note (Signed)
 BP Readings from Last 3 Encounters:  01/19/24 105/75  01/11/24 135/72  01/11/24 (!) 136/107   HTN Controlled .  On valsartan  80 mg daily Continue current medications. No changes in management. Discussed DASH diet and dietary sodium restrictions Continue to increase dietary efforts and exercise.

## 2024-01-19 NOTE — Progress Notes (Signed)
 "  Established Patient Office Visit  Subjective:  Patient ID: Maureen Campos, female    DOB: 03-14-1970  Age: 54 y.o. MRN: [id number]  CC:  Chief Complaint  Patient presents with   Diabetes   Anxiety    HPI    Discussed the use of AI scribe software for clinical note transcription with the patient, who gave verbal consent to proceed.  History of Present Illness Maureen Campos is a 54 year old female with diabetes and hypothyroidism who presents for follow-up of her medical conditions.  Her A1c has increased from 6.6% three months ago to 6.9%, which she attributes to dietary changes during the holidays. She continues to take metformin  1000 mg twice daily and Trulicity  4.5 mg once weekly for diabetes management.  She is currently taking levothyroxine  300 mcg daily for hypothyroidism. She has not had a thyroid  ultrasound before and mentions experiencing hoarseness, which she believes is related to extensive voice use and her history of hypothyroidism.  She is on valsartan  80 mg daily for hypertension, atorvastatin  80 mg daily, and Zetia  10 mg daily for hyperlipidemia. She adheres to her medication regimen and is aware of dietary recommendations to manage her cholesterol levels.  She experienced right knee pain that lasted for almost three months, significantly limiting her ability to walk, but reports that the pain has now resolved. Recently, she noticed a knot on her right wrist that causes pain, particularly in the mornings. She describes a knot on her right wrist that has not grown in size.  She has been experiencing anxiety and stress related to family issues, particularly concerning her 56 year old daughter, which has led to emotional distress and crying. No thoughts of self-harm or harm to others.  She recently had the flu, during which she took antibiotics and Tylenol  for pain. During this illness, she experienced episodes of hemoptysis, which she attributes to the severity of  her cough and medication use. The flu symptoms have resolved.   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)    Diabetes mellitus without complication (HCC)    Dyspnea    with activity   Endometrial cancer (HCC)    Family history of colon cancer 03/04/2022   Family history of uterine cancer    Fast heart beat    with activity   Fatigue    Hearing loss    Left ear   History of kidney stones    12 years ago   Lazy eye, left    Left leg numbness    occ   Migraine    occ    Past Surgical History:  Procedure Laterality Date   CHOLECYSTECTOMY  2011   IR CHEST FLUORO  04/07/2022   IR CV LINE INJECTION  04/09/2022   IR IMAGING GUIDED PORT INSERTION  03/05/2022   IR PORT REPAIR CENTRAL VENOUS ACCESS DEVICE  04/09/2022   IR US  GUIDE VASC ACCESS RIGHT  04/09/2022   ROBOTIC ASSISTED TOTAL HYSTERECTOMY WITH BILATERAL SALPINGO OOPHERECTOMY N/A 02/10/2022   Procedure: XI ROBOTIC ASSISTED TOTAL HYSTERECTOMY WITH BILATERAL SALPINGO OOPHORECTOMY;  Surgeon: Eldonna Mays, MD;  Location: [facility];  Service: Gynecology;  Laterality: N/A;   ROBOTIC PELVIC AND PARA-AORTIC LYMPH NODE DISSECTION N/A 02/10/2022   Procedure: XI ROBOTIC PELVIC AND PARA-AORTIC SENTINEL LYMPH NODE DISSECTION;  Surgeon: Eldonna Mays, MD;  Location: [facility];  Service: Gynecology;  Laterality: N/A;  Family History  Problem Relation Age of Onset   Colon cancer Father 102   Uterine cancer Sister 67   Melanoma Sister 37   Cancer Paternal Aunt        possible brain cancer, dx > 50   Breast cancer Neg Hx    Ovarian cancer Neg Hx    Endometrial cancer Neg Hx    Pancreatic cancer Neg Hx    Prostate cancer Neg Hx     Social History   Socioeconomic History   Marital status: Significant Other    Spouse name: Not on file   Number of children: 4   Years of education: Not on file   Highest education level: Not on file  Occupational  History   Not on file  Tobacco Use   Smoking status: Never   Smokeless tobacco: Never  Vaping Use   Vaping status: Never Used  Substance and Sexual Activity   Alcohol use: Not Currently    Comment: every weekend   Drug use: No   Sexual activity: Yes    Birth control/protection: None  Other Topics Concern   Not on file  Social History Narrative   Lives with her daughter   Social Drivers of Health   Tobacco Use: Low Risk (01/19/2024)   Patient History    Smoking Tobacco Use: Never    Smokeless Tobacco Use: Never    Passive Exposure: Not on file  Financial Resource Strain: Not on file  Food Insecurity: No Food Insecurity (01/19/2024)   Epic    Worried About Programme Researcher, Broadcasting/film/video in the Last Year: Never true    Ran Out of Food in the Last Year: Never true  Transportation Needs: No Transportation Needs (01/19/2024)   Epic    Lack of Transportation (Medical): No    Lack of Transportation (Non-Medical): No  Physical Activity: Not on file  Stress: Not on file  Social Connections: Not on file  Intimate Partner Violence: Not At Risk (01/19/2024)   Epic    Fear of Current or Ex-Partner: No    Emotionally Abused: No    Physically Abused: No    Sexually Abused: No  Depression (PHQ2-9): Low Risk (01/19/2024)   Depression (PHQ2-9)    PHQ-2 Score: 0  Alcohol Screen: Not on file  Housing: Low Risk (01/19/2024)   Epic    Unable to Pay for Housing in the Last Year: No    Number of Times Moved in the Last Year: 0    Homeless in the Last Year: No  Utilities: Not At Risk (01/19/2024)   Epic    Threatened with loss of utilities: No  Health Literacy: Not on file    Outpatient Medications Prior to Visit  Medication Sig Dispense Refill   atorvastatin  (LIPITOR) 80 MG tablet Take 1 tablet (80 mg total) by mouth daily. 90 tablet 3   Blood Glucose Monitoring Suppl DEVI 1 each by Does not apply route in the morning, at noon, and at bedtime. May substitute to any manufacturer covered by patient's  insurance. 1 each 0   Dulaglutide  (TRULICITY ) 4.5 MG/0.5ML SOAJ Inject 4.5 mg as directed once a week. 2 mL 11   ezetimibe  (ZETIA ) 10 MG tablet Take 1 tablet (10 mg total) by mouth daily. 90 tablet 3   levothyroxine  (SYNTHROID ) 100 MCG tablet Take 1 tablet (100 mcg total) by mouth daily before breakfast. Take with current Synthroid  200 mcg dose, for a total of 300 mcg daily 60 tablet 1  levothyroxine  (SYNTHROID ) 200 MCG tablet Take 1 tablet (200 mcg total) by mouth daily before breakfast. 60 tablet 0   lidocaine -prilocaine  (EMLA ) cream Apply to affected area once 30 g 3   metFORMIN  (GLUCOPHAGE ) 1000 MG tablet Take 1 tablet (1,000 mg total) by mouth 2 (two) times daily with a meal. 180 tablet 3   ondansetron  (ZOFRAN ) 8 MG tablet Take 1 tablet (8 mg total) by mouth every 8 (eight) hours as needed for nausea or vomiting. Start on the third day after chemotherapy. 30 tablet 1   prochlorperazine  (COMPAZINE ) 10 MG tablet Take 1 tablet (10 mg total) by mouth every 6 (six) hours as needed for nausea or vomiting. 30 tablet 1   valsartan  (DIOVAN ) 80 MG tablet Take 1 tablet (80 mg total) by mouth daily. 90 tablet 1   No facility-administered medications prior to visit.    Allergies[1]  ROS Review of Systems  Constitutional:  Negative for appetite change, chills, fatigue and fever.  HENT:  Positive for voice change. Negative for congestion, postnasal drip, rhinorrhea and sneezing.   Respiratory:  Negative for cough, shortness of breath and wheezing.   Cardiovascular:  Negative for chest pain, palpitations and leg swelling.  Gastrointestinal:  Negative for abdominal pain, constipation, nausea and vomiting.  Genitourinary:  Negative for difficulty urinating, dysuria, flank pain and frequency.  Musculoskeletal:  Negative for arthralgias, back pain, joint swelling and myalgias.  Skin:  Negative for color change, pallor, rash and wound.  Neurological:  Negative for facial asymmetry, weakness, numbness and  headaches.  Psychiatric/Behavioral:  Negative for behavioral problems, confusion, self-injury and suicidal ideas.       Objective:    Physical Exam Vitals and nursing note reviewed.  Constitutional:      General: She is not in acute distress.    Appearance: Normal appearance. She is obese. She is not ill-appearing, toxic-appearing or diaphoretic.  HENT:     Mouth/Throat:     Mouth: Mucous membranes are moist.     Tongue: No lesions. Tongue does not deviate from midline.     Pharynx: Oropharynx is clear. No oropharyngeal exudate or posterior oropharyngeal erythema.     Tonsils: No tonsillar exudate or tonsillar abscesses.  Eyes:     General: No scleral icterus.       Right eye: No discharge.        Left eye: No discharge.     Extraocular Movements: Extraocular movements intact.     Conjunctiva/sclera: Conjunctivae normal.  Neck:     Thyroid : Thyromegaly present.  Cardiovascular:     Rate and Rhythm: Normal rate and regular rhythm.     Pulses: Normal pulses.     Heart sounds: Normal heart sounds. No murmur heard.    No friction rub. No gallop.  Pulmonary:     Effort: Pulmonary effort is normal. No respiratory distress.     Breath sounds: Normal breath sounds. No stridor. No wheezing, rhonchi or rales.  Chest:     Chest wall: No tenderness.  Abdominal:     General: There is no distension.     Palpations: Abdomen is soft.     Tenderness: There is no abdominal tenderness. There is no right CVA tenderness, left CVA tenderness or guarding.  Musculoskeletal:        General: No swelling, tenderness, deformity or signs of injury.     Right lower leg: No edema.     Left lower leg: No edema.  Skin:    General: Skin is warm  and dry.     Capillary Refill: Capillary refill takes less than 2 seconds.     Coloration: Skin is not jaundiced or pale.     Findings: No bruising, erythema or lesion.     Comments: Ganglion cyst right wrist  Neurological:     Mental Status: She is alert and  oriented to person, place, and time.     Motor: No weakness.     Gait: Gait normal.  Psychiatric:        Mood and Affect: Mood normal.        Behavior: Behavior normal.        Thought Content: Thought content normal.        Judgment: Judgment normal.     BP 105/75   Pulse 89   Wt 251 lb (113.9 kg)   LMP 01/06/2022 Comment: Hysterectomy 02/10/22  SpO2 97%   BMI 49.02 kg/m  Wt Readings from Last 3 Encounters:  01/19/24 251 lb (113.9 kg)  01/11/24 246 lb (111.6 kg)  11/30/23 243 lb (110.2 kg)    Lab Results  Component Value Date   TSH 99.300 (H) 01/11/2024   Lab Results  Component Value Date   WBC 8.0 01/11/2024   HGB 13.8 01/11/2024   HCT 41.9 01/11/2024   MCV 88.2 01/11/2024   PLT 271 01/11/2024   Lab Results  Component Value Date   NA 136 01/11/2024   K 3.7 01/11/2024   CO2 27 01/11/2024   GLUCOSE 146 (H) 01/11/2024   BUN 9 01/11/2024   CREATININE 0.76 01/11/2024   BILITOT 0.3 01/11/2024   ALKPHOS 70 01/11/2024   AST 36 01/11/2024   ALT 38 01/11/2024   PROT 8.5 (H) 01/11/2024   ALBUMIN 4.4 01/11/2024   CALCIUM  9.5 01/11/2024   ANIONGAP 11 01/11/2024   EGFR 112 02/08/2023   Lab Results  Component Value Date   CHOL 178 09/28/2023   Lab Results  Component Value Date   HDL 36 (L) 09/28/2023   Lab Results  Component Value Date   LDLCALC 102 (H) 09/28/2023   Lab Results  Component Value Date   TRIG 231 (H) 09/28/2023   Lab Results  Component Value Date   CHOLHDL 4.9 (H) 09/28/2023   Lab Results  Component Value Date   HGBA1C 6.9 (A) 01/19/2024      Assessment & Plan:   Problem List Items Addressed This Visit       Cardiovascular and Mediastinum   High blood pressure   BP Readings from Last 3 Encounters:  01/19/24 105/75  01/11/24 135/72  01/11/24 (!) 136/107   HTN Controlled .  On valsartan  80 mg daily Continue current medications. No changes in management. Discussed DASH diet and dietary sodium restrictions Continue to increase  dietary efforts and exercise.           Endocrine   Diabetes mellitus type 2, uncomplicated (HCC) - Primary   Lab Results  Component Value Date   HGBA1C 6.9 (A) 01/19/2024   Type 2 diabetes mellitus A1c increased to 6.9%, likely due to dietary indiscretions. Managed with metformin  and Trulicity . - Continue metformin  1000 mg oral twice daily. - Continue Trulicity  4.5 mg inject once a week. - Encouraged dietary modifications to reduce sugar and carbohydrate intake. - Encouraged regular moderate exercise at least 150 minutes weekly as tolerated. Up-to-date with annual retinopathy screening, will request for records       Relevant Orders   POCT glycosylated hemoglobin (Hb A1C) (Completed)   Lipid  panel   Acquired hypothyroidism    - Continue levothyroxine  300 mcg oral daily.          Other   Obesity, Class III, BMI 40-49.9 (morbid obesity) (HCC)   Wt Readings from Last 3 Encounters:  01/19/24 251 lb (113.9 kg)  01/11/24 246 lb (111.6 kg)  11/30/23 243 lb (110.2 kg)   Body mass index is 49.02 kg/m.  Counseled on low-carb diet Encouraged regular moderate exercise at least 150 minutes weekly as tolerated      Dyslipidemia, goal LDL below 70   Lab Results  Component Value Date   CHOL 178 09/28/2023   HDL 36 (L) 09/28/2023   LDLCALC 102 (H) 09/28/2023   LDLDIRECT 75 01/05/2023   TRIG 231 (H) 09/28/2023   CHOLHDL 4.9 (H) 09/28/2023     Managed with atorvastatin  and Zetia . - Ordered lipid panel. - Continue atorvastatin  80 mg oral daily. - Encouraged heart-healthy low-fat diet and regular exercise.        Hoarseness of voice   Could be due to recent flu infection,        Relevant Orders   US  THYROID    Anxiety      01/19/2024   10:24 AM 09/28/2023   11:15 AM 06/28/2023    2:38 PM 05/03/2023    4:00 PM  GAD 7 : Generalized Anxiety Score  Nervous, Anxious, on Edge 2 1 0 1  Control/stop worrying 0 0 0 1  Worry too much - different things 3 0 0 0  Trouble  relaxing 3 0 0 0  Restless 3 0 0 0  Easily annoyed or irritable 3 0 0 0  Afraid - awful might happen 0 0 0 1  Total GAD 7 Score 14 1 0 3  Anxiety Difficulty Extremely difficult Not difficult at all Not difficult at all Not difficult at all  Anxiety Experiencing anxiety due to family stressors. No suicidal ideation.        Ganglion cyst of wrist, right   Ganglion cyst, right wrist Ganglion cyst causing morning pain. No growth observed. Discussed potential orthopedic referral. - she chooses to monitor cyst for changes in size or pain. - Will consider referral to orthopedics if pain persists.         Other Visit Diagnoses       Thyromegaly       Relevant Orders   US  THYROID        No orders of the defined types were placed in this encounter.   Follow-up: Return in about 4 months (around 05/18/2024) for HTN, DM.    Carlous Olivares R Finnbar Cedillos, FNP     [1]  Allergies Allergen Reactions   Paclitaxel  Shortness Of Breath and Other (See Comments)    See progress note from 04/10/22   Advil  [Ibuprofen ] Itching and Rash   "

## 2024-01-19 NOTE — Assessment & Plan Note (Signed)
" °-   Continue levothyroxine  300 mcg oral daily.   "

## 2024-01-19 NOTE — Assessment & Plan Note (Signed)
 Could be due to recent flu infection,

## 2024-01-19 NOTE — Assessment & Plan Note (Addendum)
 Lab Results  Component Value Date   HGBA1C 6.9 (A) 01/19/2024   Type 2 diabetes mellitus A1c increased to 6.9%, likely due to dietary indiscretions. Managed with metformin  and Trulicity . - Continue metformin  1000 mg oral twice daily. - Continue Trulicity  4.5 mg inject once a week. - Encouraged dietary modifications to reduce sugar and carbohydrate intake. - Encouraged regular moderate exercise at least 150 minutes weekly as tolerated. Up-to-date with annual retinopathy screening, will request for records

## 2024-01-19 NOTE — Assessment & Plan Note (Signed)
" °    01/19/2024   10:24 AM 09/28/2023   11:15 AM 06/28/2023    2:38 PM 05/03/2023    4:00 PM  GAD 7 : Generalized Anxiety Score  Nervous, Anxious, on Edge 2 1 0 1  Control/stop worrying 0 0 0 1  Worry too much - different things 3 0 0 0  Trouble relaxing 3 0 0 0  Restless 3 0 0 0  Easily annoyed or irritable 3 0 0 0  Afraid - awful might happen 0 0 0 1  Total GAD 7 Score 14 1 0 3  Anxiety Difficulty Extremely difficult Not difficult at all Not difficult at all Not difficult at all  Anxiety Experiencing anxiety due to family stressors. No suicidal ideation.   "

## 2024-01-19 NOTE — Assessment & Plan Note (Addendum)
 Wt Readings from Last 3 Encounters:  01/19/24 251 lb (113.9 kg)  01/11/24 246 lb (111.6 kg)  11/30/23 243 lb (110.2 kg)   Body mass index is 49.02 kg/m.  Counseled on low-carb diet Encouraged regular moderate exercise at least 150 minutes weekly as tolerated

## 2024-01-19 NOTE — Patient Instructions (Addendum)
 1. Type 2 diabetes mellitus without complication, without long-term current use of insulin  (HCC) (Primary) - POCT glycosylated hemoglobin (Hb A1C) - Lipid panel  2. Obesity, Class III, BMI 40-49.9 (morbid obesity) (HCC)  3. Hoarseness of voice - US  THYROID ; Future  4. Thyromegaly - US  THYROID ; Future     It is important that you exercise regularly at least 30 minutes 5 times a week as tolerated  Think about what you will eat, plan ahead. Choose  clean, green, fresh or frozen over canned, processed or packaged foods which are more sugary, salty and fatty. 70 to 75% of food eaten should be vegetables and fruit. Three meals at set times with snacks allowed between meals, but they must be fruit or vegetables. Aim to eat over a 12 hour period , example 7 am to 7 pm, and STOP after  your last meal of the day. Drink water ,generally about 64 ounces per day, no other drink is as healthy. Fruit juice is best enjoyed in a healthy way, by EATING the fruit.  Thanks for choosing [facility] we consider it a privelige to serve you.

## 2024-01-20 ENCOUNTER — Other Ambulatory Visit: Payer: Self-pay

## 2024-01-20 LAB — LIPID PANEL
Chol/HDL Ratio: 8.6 ratio — ABNORMAL HIGH (ref 0.0–4.4)
Cholesterol, Total: 369 mg/dL — ABNORMAL HIGH (ref 100–199)
HDL: 43 mg/dL
LDL Chol Calc (NIH): 245 mg/dL — ABNORMAL HIGH (ref 0–99)
Triglycerides: 367 mg/dL — ABNORMAL HIGH (ref 0–149)
VLDL Cholesterol Cal: 81 mg/dL — ABNORMAL HIGH (ref 5–40)

## 2024-01-21 ENCOUNTER — Ambulatory Visit: Payer: Self-pay | Admitting: Nurse Practitioner

## 2024-01-21 ENCOUNTER — Other Ambulatory Visit: Payer: Self-pay | Admitting: Nurse Practitioner

## 2024-01-21 DIAGNOSIS — E785 Hyperlipidemia, unspecified: Secondary | ICD-10-CM

## 2024-01-24 ENCOUNTER — Other Ambulatory Visit: Payer: Self-pay

## 2024-01-24 ENCOUNTER — Telehealth: Payer: Self-pay

## 2024-01-24 LAB — SPECIMEN STATUS REPORT

## 2024-01-24 LAB — LDL CHOLESTEROL, DIRECT: LDL Direct: 260 mg/dL — ABNORMAL HIGH (ref 0–99)

## 2024-01-24 NOTE — Telephone Encounter (Signed)
 MAILED [facility] APPLICATION FOR [facility] TO PATIENT TODAY.

## 2024-01-24 NOTE — Progress Notes (Signed)
 Patient's PCP outreached pharmacy to investigate eligibility for [employer] patient assistance program. Patient's LDL-C, triglycerides, and TC has rapidly worsened over the past 3 months. T2DM has remained controlled with most recent A1C 6.9%. 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   Lipid Panel     Component Value Date/Time   CHOL 369 (H) 01/19/2024 1108   TRIG 367 (H) 01/19/2024 1108   HDL 43 01/19/2024 1108   CHOLHDL 8.6 (H) 01/19/2024 1108   LDLCALC 245 (H) 01/19/2024 1108   LDLDIRECT 260 (H) 01/19/2024 1108   LABVLDL 81 (H) 01/19/2024 1108   Hyperlipidemia/ASCVD Risk Reduction: - Currently uncontrolled with most recent LDL-C of 260 mg/dL above goal < 70 mg/dL given U7IF + risk factors. Patient is taking atorvastatin  80 mg daily and ezetimibe  10 mg daily. Appropriate to add [id number]. Patient may be eligible for [employer] patient assistance program (uninsured). Will collaborated with patient advocate team to initiate application.   Lorain Baseman, PharmD [facility] [phone]

## 2024-02-08 ENCOUNTER — Encounter: Payer: Self-pay | Admitting: Hematology and Oncology

## 2024-02-08 ENCOUNTER — Other Ambulatory Visit: Payer: Self-pay

## 2024-02-22 ENCOUNTER — Other Ambulatory Visit: Payer: Self-pay

## 2024-02-22 ENCOUNTER — Inpatient Hospital Stay: Admitting: Hematology and Oncology

## 2024-02-22 ENCOUNTER — Inpatient Hospital Stay: Attending: Psychiatry

## 2024-02-22 ENCOUNTER — Inpatient Hospital Stay

## 2024-05-19 ENCOUNTER — Ambulatory Visit: Payer: Self-pay | Admitting: Nurse Practitioner
